# Patient Record
Sex: Female | Born: 1945 | Race: White | Hispanic: No | Marital: Married | Smoking: Never smoker
Health system: Southern US, Community
[De-identification: ages and names within clinical notes are randomized; demographics above are authoritative.]

## PROBLEM LIST (undated history)

## (undated) DIAGNOSIS — Z8719 Personal history of other diseases of the digestive system: Secondary | ICD-10-CM

## (undated) DIAGNOSIS — I499 Cardiac arrhythmia, unspecified: Secondary | ICD-10-CM

## (undated) DIAGNOSIS — E039 Hypothyroidism, unspecified: Secondary | ICD-10-CM

## (undated) DIAGNOSIS — R0602 Shortness of breath: Secondary | ICD-10-CM

## (undated) HISTORY — PX: BUNIONECTOMY: SHX129

---

## 1998-02-25 ENCOUNTER — Other Ambulatory Visit: Admission: RE | Admit: 1998-02-25 | Discharge: 1998-02-25 | Payer: Self-pay | Admitting: Gynecology

## 1999-03-18 ENCOUNTER — Other Ambulatory Visit: Admission: RE | Admit: 1999-03-18 | Discharge: 1999-03-18 | Payer: Self-pay | Admitting: Gynecology

## 2000-03-20 ENCOUNTER — Other Ambulatory Visit: Admission: RE | Admit: 2000-03-20 | Discharge: 2000-03-20 | Payer: Self-pay | Admitting: Gynecology

## 2000-09-26 ENCOUNTER — Ambulatory Visit (HOSPITAL_COMMUNITY): Admission: RE | Admit: 2000-09-26 | Discharge: 2000-09-26 | Payer: Self-pay | Admitting: Gastroenterology

## 2000-09-26 ENCOUNTER — Encounter (INDEPENDENT_AMBULATORY_CARE_PROVIDER_SITE_OTHER): Payer: Self-pay

## 2000-10-31 DIAGNOSIS — D069 Carcinoma in situ of cervix, unspecified: Secondary | ICD-10-CM

## 2000-10-31 HISTORY — DX: Carcinoma in situ of cervix, unspecified: D06.9

## 2001-05-08 ENCOUNTER — Other Ambulatory Visit: Admission: RE | Admit: 2001-05-08 | Discharge: 2001-05-08 | Payer: Self-pay | Admitting: Gynecology

## 2001-05-31 ENCOUNTER — Ambulatory Visit (HOSPITAL_COMMUNITY): Admission: RE | Admit: 2001-05-31 | Discharge: 2001-05-31 | Payer: Self-pay | Admitting: Gynecology

## 2001-05-31 ENCOUNTER — Encounter (INDEPENDENT_AMBULATORY_CARE_PROVIDER_SITE_OTHER): Payer: Self-pay

## 2001-08-10 ENCOUNTER — Encounter (INDEPENDENT_AMBULATORY_CARE_PROVIDER_SITE_OTHER): Payer: Self-pay | Admitting: *Deleted

## 2001-08-10 ENCOUNTER — Ambulatory Visit (HOSPITAL_COMMUNITY): Admission: RE | Admit: 2001-08-10 | Discharge: 2001-08-10 | Payer: Self-pay | Admitting: Gynecology

## 2001-11-08 ENCOUNTER — Other Ambulatory Visit: Admission: RE | Admit: 2001-11-08 | Discharge: 2001-11-08 | Payer: Self-pay | Admitting: Gynecology

## 2002-05-09 ENCOUNTER — Other Ambulatory Visit: Admission: RE | Admit: 2002-05-09 | Discharge: 2002-05-09 | Payer: Self-pay | Admitting: Gynecology

## 2002-11-18 ENCOUNTER — Other Ambulatory Visit: Admission: RE | Admit: 2002-11-18 | Discharge: 2002-11-18 | Payer: Self-pay | Admitting: Gynecology

## 2003-11-20 ENCOUNTER — Other Ambulatory Visit: Admission: RE | Admit: 2003-11-20 | Discharge: 2003-11-20 | Payer: Self-pay | Admitting: Gynecology

## 2004-12-02 ENCOUNTER — Other Ambulatory Visit: Admission: RE | Admit: 2004-12-02 | Discharge: 2004-12-02 | Payer: Self-pay | Admitting: Gynecology

## 2005-12-15 ENCOUNTER — Other Ambulatory Visit: Admission: RE | Admit: 2005-12-15 | Discharge: 2005-12-15 | Payer: Self-pay | Admitting: Gynecology

## 2007-11-01 HISTORY — PX: FOOT SURGERY: SHX648

## 2007-11-01 HISTORY — PX: KNEE ARTHROSCOPY W/ MENISCAL REPAIR: SHX1877

## 2009-01-08 ENCOUNTER — Encounter: Admission: RE | Admit: 2009-01-08 | Discharge: 2009-01-08 | Payer: Self-pay | Admitting: Gastroenterology

## 2011-03-18 NOTE — Op Note (Signed)
[facility]  Patient:    April Padilla, April Padilla Visit Number: [id number] MRN: [id number]          Service Type: DSU Location: [facility] Attending Physician:  Susa Raring Dictated by:   Luvenia Redden, M.D. Proc. Date: 08/10/01 Admit Date:  08/10/2001                             Operative Report  PREOPERATIVE DIAGNOSIS:       Abnormal cervical cytology.  POSTOPERATIVE DIAGNOSIS:      Abnormal cervical cytology.  OPERATION:                    Cold knife conization of the cervix and D&C.  SURGEON:                      Luvenia Redden, M.D.  DESCRIPTION OF PROCEDURE:     Under good sedation, the patient was given a paracervical block by injecting 10 cc of 1% lidocaine at the 4 and 8 oclock positions paracervically.  The uterus was sounded to a depth of 3-1/2 inches. A 0 Vicryl suture was placed submucosally around the cervix and cerclage.  A #11 knife blade was then used to incise a cone-shaped portion of the cervix and the endocervical canal.  Biopsy specimen was tagged at the 12 oclock position with a suture.  The internal os was then dilated slightly.  Curettage was done and this was sent as a separate specimen of endometrial scrapings. The uterine sound was placed in the canal and the internal os, and the suture was then tied down tightly around the uterine sound.  The sound was removed and the area was then observed for excess bleeding for several minutes. Bleeding was mild and the procedure was then terminated.  The patient tolerated the procedure well and she was removed to recovery in good condition. Dictated by:   Luvenia Redden, M.D. Attending Physician:  Susa Raring DD:  08/10/01 TD:  08/10/01 Job: [id number]

## 2011-03-18 NOTE — Op Note (Signed)
[facility]  Patient:    April Padilla, April Padilla                        MRN: [id number] Proc. Date: 05/31/01 Adm. Date:  [id number] Attending:  Susa Raring                           Operative Report  PREOPERATIVE DIAGNOSIS:       Atypical glandular cells on Papanicolaou smear.  POSTOPERATIVE DIAGNOSES:      1. Atypical glandular cells on Papanicolaou                                  smear.                               2. Endometrial polyps.                               3. A small submucous myoma.  OPERATION:  SURGEON:                      Luvenia Redden, M.D.  ANESTHESIA:  ESTIMATED BLOOD LOSS:         10 cc.  DESCRIPTION OF PROCEDURE:     An exam revealed the uterus anterior. Bladder was catheterized. The cervix was grasped with a tenaculum. The cervical endometrial canal was sounded to a depth of 3 inches. The cervix was dilated the hysteroscope was placed in the cervical canal. In the lower segment posteriorly there is a small, looked like 2 cm, submucous myoma that was pale and broad based. Above this, was a large polypoid structure that was moving freely within the endometrial cavity. Both ostia were viewed and they appeared to be normal. The scope was retracted. An ECC was done and this was sent as a separate specimen. The cavity was then explored with polyp forceps and multiple fragments of polypoid tissue was obtained. Next, the endometrial cavity was curetted thoroughly until it was clean. The uterine cavity was wiped with a dry sponge, no debris was obtained. There was no excess bleeding. There was no fluid deficit of Sorbitol. Estimated blood loss was 10 cc, none was replaced. The patient was removed to recovery in good condition. DD:  05/31/01 TD:  06/01/01 Job: [id number]

## 2011-04-07 ENCOUNTER — Other Ambulatory Visit: Payer: Self-pay | Admitting: Gynecology

## 2011-12-13 ENCOUNTER — Encounter (HOSPITAL_COMMUNITY)
Admission: RE | Admit: 2011-12-13 | Discharge: 2011-12-13 | Disposition: A | Discharge status: Home / Self Care | Payer: Medicare Other | Source: Ambulatory Visit | Attending: Orthopedic Surgery | Admitting: Orthopedic Surgery

## 2011-12-13 ENCOUNTER — Encounter (HOSPITAL_COMMUNITY): Payer: Self-pay

## 2011-12-13 ENCOUNTER — Other Ambulatory Visit: Payer: Self-pay | Admitting: Orthopedic Surgery

## 2011-12-13 ENCOUNTER — Other Ambulatory Visit: Payer: Self-pay

## 2011-12-13 ENCOUNTER — Encounter (HOSPITAL_COMMUNITY): Payer: Self-pay | Admitting: Pharmacy Technician

## 2011-12-13 DIAGNOSIS — Z01818 Encounter for other preprocedural examination: Secondary | ICD-10-CM | POA: Insufficient documentation

## 2011-12-13 DIAGNOSIS — M1710 Unilateral primary osteoarthritis, unspecified knee: Secondary | ICD-10-CM | POA: Insufficient documentation

## 2011-12-13 HISTORY — DX: Hypothyroidism, unspecified: E03.9

## 2011-12-13 HISTORY — DX: Cardiac arrhythmia, unspecified: I49.9

## 2011-12-13 HISTORY — DX: Shortness of breath: R06.02

## 2011-12-13 HISTORY — DX: Personal history of other diseases of the digestive system: Z87.19

## 2011-12-13 LAB — TYPE AND SCREEN
ABO/RH(D): A NEG
Antibody Screen: NEGATIVE

## 2011-12-13 LAB — DIFFERENTIAL
Basophils Relative: 1 % (ref 0–1)
Monocytes Absolute: 0.5 10*3/uL (ref 0.1–1.0)
Monocytes Relative: 8 % (ref 3–12)
Neutro Abs: 2.9 10*3/uL (ref 1.7–7.7)

## 2011-12-13 LAB — ABO/RH: ABO/RH(D): A NEG

## 2011-12-13 LAB — COMPREHENSIVE METABOLIC PANEL
AST: 18 U/L (ref 0–37)
Albumin: 3.9 g/dL (ref 3.5–5.2)
Chloride: 103 mEq/L (ref 96–112)
Creatinine, Ser: 0.76 mg/dL (ref 0.50–1.10)
Potassium: 3.8 mEq/L (ref 3.5–5.1)
Total Bilirubin: 0.3 mg/dL (ref 0.3–1.2)

## 2011-12-13 LAB — URINALYSIS, ROUTINE W REFLEX MICROSCOPIC
Glucose, UA: NEGATIVE mg/dL
Nitrite: NEGATIVE
Protein, ur: NEGATIVE mg/dL
Urobilinogen, UA: 0.2 mg/dL (ref 0.0–1.0)

## 2011-12-13 LAB — APTT: aPTT: 29 seconds (ref 24–37)

## 2011-12-13 LAB — CBC
MCV: 92 fL (ref 78.0–100.0)
Platelets: 272 10*3/uL (ref 150–400)
RDW: 12.3 % (ref 11.5–15.5)
WBC: 6.4 10*3/uL (ref 4.0–10.5)

## 2011-12-13 LAB — SURGICAL PCR SCREEN: Staphylococcus aureus: NEGATIVE

## 2011-12-13 LAB — URINE MICROSCOPIC-ADD ON

## 2011-12-13 NOTE — Progress Notes (Signed)
Requested ekg,office notes, any cardiac studies from [facility] in [place] pt. Sees Dr. Rhona Leavens.

## 2011-12-13 NOTE — Progress Notes (Signed)
Requested  Orders from Dr. Tobin Chad office, spoke to [place].

## 2011-12-13 NOTE — Pre-Procedure Instructions (Signed)
20 LADAWN BOULLION  12/13/2011   Your procedure is scheduled on:  Monday  12/26/11 Report to [facility] at  5:30 AM.  Call this number if you have problems the morning of surgery: [phone]   Remember:   Do not eat food:After Midnight.  May have clear liquids: up to 4 Hours before arrival. (1:30 am)  Clear liquids include soda, tea, black coffee, apple or grape juice, broth.  Take these medicines the morning of surgery with A SIP OF WATER: levothyroxine, toprol-xl   Do not wear jewelry, make-up or nail polish.  Do not wear lotions, powders, or perfumes. You may wear deodorant.  Do not shave 48 hours prior to surgery.  Do not bring valuables to the hospital.  Contacts, dentures or bridgework may not be worn into surgery.  Leave suitcase in the car. After surgery it may be brought to your room.  For patients admitted to the hospital, checkout time is 11:00 AM the day of discharge.   Patients discharged the day of surgery will not be allowed to drive home.  Name and phone number of your driver: Talibah Colasurdo [phone]  Special Instructions: CHG Shower Use Special Wash: 1/2 bottle night before surgery and 1/2 bottle morning of surgery.   Please read over the following fact sheets that you were given: Pain Booklet, Coughing and Deep Breathing, Blood Transfusion Information, MRSA Information and Surgical Site Infection Prevention

## 2011-12-14 ENCOUNTER — Encounter (HOSPITAL_COMMUNITY): Payer: Self-pay | Admitting: Vascular Surgery

## 2011-12-14 LAB — URINE CULTURE

## 2011-12-14 NOTE — Consult Note (Addendum)
Anesthesia:  Patient is a 66 year old female scheduled for a left TKA on 12/26/11.  History includes paroxysmal SVT, palpitations, hypothyroidism, SOB, HLD, mild-mod MR 2009 and minimal CAD by CTA 2010 (by Cardiology notes), hiatal hernia, non-smoker.  She is on beta blocker therapy.  EKG shows SR with sinus arrhythmia, cannot rule out anteroseptal infarct (age undetermined).  She has been evaluated by Dr. Rhona Leavens with [facility] in the past, last visit was in November of 2012 and PRN follow-up was recommended unless she developed new or worsening symptoms.  Lead III, aVF r wave are now lower, but otherwise I feel her EKG is stable when compared to 09/01/11.     She had an Exercise Sestamibi on 09/15/08 that showed no ischemia, normal gated imaging, and normal EF.  An echo from 5/509 showed normal LV systolic function, borderline LA enlargement, mild-mod MR, mild TR/PR.  An event recorder in 2008 showed brief runs of SVT.  She apparently has had a cardiac CTA showing minimal disease (records requested).   CXR shows no active disease. Mild degenerative changes mid thoracic spine.   Labs noted.  Anticipate she can proceed if no new CV symptoms.  Addendum: 12/23/11 1120  Over the past week, I've made several attempts to contact Ms. Decoste regarding location of her cardiac CT, but have yet to hear back from her.  I was able to speak with her husband this morning.  He thought the cardiac CT had been done at [facility], but both [facility] and [facility] report that they do not have a record that this test was done there.  While on the phone, I notified her husband of the time change of her surgery.  He says Dr. Tobin Chad office had already contacted them regarding arriving at 0800 on the day of surgery.  Unfortunately, Ms. Burda' mother is doing poorly, so depending of the outcome he says she may have to postpone her surgery all together.  They will keep in communication with  Dr. Sherlean Foot as needed.

## 2011-12-24 ENCOUNTER — Other Ambulatory Visit: Payer: Self-pay | Admitting: Orthopedic Surgery

## 2011-12-24 NOTE — H&P (Signed)
  April Padilla MRN:  [id number] DOB/SEX:  09/23/1946/female  CHIEF COMPLAINT:  Painful left Knee  HISTORY: Patient is a 66 y.o. female presented with a history of pain in the left knee. Onset of symptoms was gradual starting several years ago with gradually worsening course since that time. The patient noted no past surgery on the left knee. Prior procedures on the knee include arthroscopy. Patient has been treated conservatively with over-the-counter NSAIDs and activity modification. Patient currently rates pain in the knee at 8 out of 10 with activity. There is pain at night.  PAST MEDICAL HISTORY: There are no active problems to display for this patient.  Past Medical History  Diagnosis Date  . Dysrhythmia     on toprol for fast heart beat  . Shortness of breath     with exertion  . H/O hiatal hernia   . Hypothyroidism     pt. states she took radioactive pill and has no thyroid   Past Surgical History  Procedure Date  . Knee arthroscopy w/ meniscal repair 2009    left  . Foot surgery 2009    took knot out  and put in screws  . Bunionectomy yrs. ago    left foot     MEDICATIONS:   (Not in a hospital admission)  ALLERGIES:   Allergies  Allergen Reactions  . Clindamycin/Lincomycin Rash  . Macrobid Rash  . Silver Sulfadiazine Rash    REVIEW OF SYSTEMS:  Pertinent items are noted in HPI.   FAMILY HISTORY:  No family history on file.  SOCIAL HISTORY:   History  Substance Use Topics  . Smoking status: Never Smoker   . Smokeless tobacco: Not on file  . Alcohol Use: Yes     occasional     EXAMINATION:  Vital signs in last 24 hours: @VSRANGES @  General appearance: alert, cooperative and no distress Lungs: clear to auscultation bilaterally Heart: regular rate and rhythm, S1, S2 normal, no murmur, click, rub or gallop Abdomen: soft, non-tender; bowel sounds normal; no masses,  no organomegaly Extremities: extremities normal, atraumatic, no cyanosis or edema  and Homans sign is negative, no sign of DVT Pulses: 2+ and symmetric Skin: Skin color, texture, turgor normal. No rashes or lesions Neurologic: Alert and oriented X 3, normal strength and tone. Normal symmetric reflexes. Normal coordination and gait  Musculoskeletal:  ROM 0-110, Ligaments intact,  Imaging Review Plain radiographs demonstrate severe degenerative joint disease of the left knee. The overall alignment is mild varus. The bone quality appears to be fair for age and reported activity level.  Assessment/Plan: End stage arthritis, left knee   The patient history, physical examination and imaging studies are consistent with advanced degenerative joint disease of the left knee. The patient has failed conservative treatment.  The clearance notes were reviewed.  After discussion with the patient it was felt that Total Knee Replacement was indicated. The procedure,  risks, and benefits of total knee arthroplasty were presented and reviewed. The risks including but not limited to aseptic loosening, infection, blood clots, vascular injury, stiffness, patella tracking problems complications among others were discussed. The patient acknowledged the explanation, agreed to proceed with the plan.  Kiyoko Mcguirt 12/24/2011, 3:24 PM

## 2011-12-25 MED ORDER — CEFAZOLIN SODIUM-DEXTROSE 2-3 GM-% IV SOLR: 2.0000 g | INTRAVENOUS | Status: DC

## 2011-12-26 ENCOUNTER — Encounter (HOSPITAL_COMMUNITY): Admission: RE | Payer: Self-pay | Source: Ambulatory Visit

## 2011-12-26 ENCOUNTER — Ambulatory Visit (HOSPITAL_COMMUNITY): Admission: RE | Admit: 2011-12-26 | Payer: Medicare Other | Source: Ambulatory Visit | Admitting: Orthopedic Surgery

## 2011-12-26 SURGERY — ARTHROPLASTY, KNEE, TOTAL
Anesthesia: Regional | Laterality: Left

## 2012-01-05 ENCOUNTER — Other Ambulatory Visit: Payer: Self-pay | Admitting: Orthopedic Surgery

## 2012-01-06 ENCOUNTER — Encounter (HOSPITAL_COMMUNITY)
Admission: RE | Admit: 2012-01-06 | Discharge: 2012-01-06 | Disposition: A | Discharge status: Home / Self Care | Payer: Medicare Other | Source: Ambulatory Visit | Attending: Orthopedic Surgery | Admitting: Orthopedic Surgery

## 2012-01-06 ENCOUNTER — Encounter (HOSPITAL_COMMUNITY): Payer: Self-pay

## 2012-01-06 LAB — URINALYSIS, ROUTINE W REFLEX MICROSCOPIC
Leukocytes, UA: NEGATIVE
Protein, ur: NEGATIVE mg/dL
Urobilinogen, UA: 0.2 mg/dL (ref 0.0–1.0)

## 2012-01-06 LAB — TYPE AND SCREEN
ABO/RH(D): A NEG
Antibody Screen: NEGATIVE

## 2012-01-06 LAB — DIFFERENTIAL
Basophils Absolute: 0 10*3/uL (ref 0.0–0.1)
Lymphocytes Relative: 35 % (ref 12–46)
Neutro Abs: 3.3 10*3/uL (ref 1.7–7.7)
Neutrophils Relative %: 51 % (ref 43–77)

## 2012-01-06 LAB — COMPREHENSIVE METABOLIC PANEL
AST: 16 U/L (ref 0–37)
BUN: 11 mg/dL (ref 6–23)
CO2: 27 mEq/L (ref 19–32)
Calcium: 9.9 mg/dL (ref 8.4–10.5)
Creatinine, Ser: 0.84 mg/dL (ref 0.50–1.10)
GFR calc Af Amer: 82 mL/min — ABNORMAL LOW (ref >90)
GFR calc non Af Amer: 70 mL/min — ABNORMAL LOW (ref >90)

## 2012-01-06 LAB — PROTIME-INR
INR: 0.92 (ref 0.00–1.49)
Prothrombin Time: 12.6 seconds (ref 11.6–15.2)

## 2012-01-06 LAB — CBC
MCH: 30.8 pg (ref 26.0–34.0)
MCV: 90.1 fL (ref 78.0–100.0)
Platelets: 249 10*3/uL (ref 150–400)
RBC: 4.26 MIL/uL (ref 3.87–5.11)

## 2012-01-06 MED ORDER — CHLORHEXIDINE GLUCONATE 4 % EX LIQD: 60.0000 mL | Freq: Once | CUTANEOUS | Status: DC

## 2012-01-06 NOTE — Pre-Procedure Instructions (Signed)
[name]  01/06/2012   Your procedure is scheduled on:  Mon, Mar 18 @ 1115 am  Report to [facility] at 0915 AM.  Call this number if you have problems the morning of surgery: [phone]   Remember:   Do not eat food:After Midnight.  May have clear liquids: up to 4 Hours before arrival.(until 5:15 am)  Clear liquids include soda, tea, black coffee, apple or grape juice, broth.  Take these medicines the morning of surgery with A SIP OF WATER: Metoprolol and Synthroid   Do not wear jewelry, make-up or nail polish.  Do not wear lotions, powders, or perfumes. You may wear deodorant.  Do not shave 48 hours prior to surgery.  Do not bring valuables to the hospital.  Contacts, dentures or bridgework may not be worn into surgery.  Leave suitcase in the car. After surgery it may be brought to your room.  For patients admitted to the hospital, checkout time is 11:00 AM the day of discharge.   Patients discharged the day of surgery will not be allowed to drive home.    Special Instructions: Incentive Spirometry - Practice and bring it with you on the day of surgery. and CHG Shower Use Special Wash: 1/2 bottle night before surgery and 1/2 bottle morning of surgery.   Please read over the following fact sheets that you were given: Pain Booklet, Coughing and Deep Breathing, Blood Transfusion Information, Total Joint Packet, MRSA Information and Surgical Site Infection Prevention

## 2012-01-07 LAB — URINE CULTURE

## 2012-01-15 MED ORDER — CEFAZOLIN SODIUM-DEXTROSE 2-3 GM-% IV SOLR
2.0000 g | INTRAVENOUS | Status: AC
  Administered 2012-01-16: 2 g via INTRAVENOUS
  Filled 2012-01-15 (×4): qty 50

## 2012-01-16 ENCOUNTER — Inpatient Hospital Stay (HOSPITAL_COMMUNITY)
Admission: RE | Admit: 2012-01-16 | Discharge: 2012-01-18 | DRG: 470 | Disposition: A | Discharge status: Home Health | Payer: Medicare Other | Source: Ambulatory Visit | Attending: Orthopedic Surgery | Admitting: Orthopedic Surgery

## 2012-01-16 ENCOUNTER — Ambulatory Visit (HOSPITAL_COMMUNITY): Payer: Medicare Other | Admitting: Vascular Surgery

## 2012-01-16 ENCOUNTER — Encounter (HOSPITAL_COMMUNITY): Payer: Self-pay | Admitting: Anesthesiology

## 2012-01-16 ENCOUNTER — Encounter (HOSPITAL_COMMUNITY): Payer: Self-pay | Admitting: Vascular Surgery

## 2012-01-16 ENCOUNTER — Encounter (HOSPITAL_COMMUNITY)
Admission: RE | Disposition: A | Discharge status: Home Health | Payer: Self-pay | Source: Ambulatory Visit | Attending: Orthopedic Surgery

## 2012-01-16 ENCOUNTER — Encounter (HOSPITAL_COMMUNITY): Payer: Self-pay | Admitting: *Deleted

## 2012-01-16 DIAGNOSIS — D62 Acute posthemorrhagic anemia: Secondary | ICD-10-CM | POA: Diagnosis not present

## 2012-01-16 DIAGNOSIS — E039 Hypothyroidism, unspecified: Secondary | ICD-10-CM | POA: Diagnosis present

## 2012-01-16 DIAGNOSIS — M1710 Unilateral primary osteoarthritis, unspecified knee: Principal | ICD-10-CM | POA: Diagnosis present

## 2012-01-16 DIAGNOSIS — M1712 Unilateral primary osteoarthritis, left knee: Secondary | ICD-10-CM

## 2012-01-16 DIAGNOSIS — Z79899 Other long term (current) drug therapy: Secondary | ICD-10-CM

## 2012-01-16 HISTORY — PX: TOTAL KNEE ARTHROPLASTY: SHX125

## 2012-01-16 SURGERY — ARTHROPLASTY, KNEE, TOTAL
Anesthesia: General | Site: Knee | Laterality: Left | Wound class: Clean

## 2012-01-16 MED ORDER — FENTANYL CITRATE 0.05 MG/ML IJ SOLN
INTRAMUSCULAR | Status: DC | PRN
  Administered 2012-01-16: 100 ug via INTRAVENOUS
  Administered 2012-01-16 (×2): 25 ug via INTRAVENOUS
  Administered 2012-01-16: 50 ug via INTRAVENOUS
  Administered 2012-01-16 (×2): 25 ug via INTRAVENOUS

## 2012-01-16 MED ORDER — FENTANYL CITRATE 0.05 MG/ML IJ SOLN
INTRAMUSCULAR | Status: AC
  Filled 2012-01-16: qty 2

## 2012-01-16 MED ORDER — ACETAMINOPHEN 10 MG/ML IV SOLN
INTRAVENOUS | Status: AC
  Filled 2012-01-16: qty 100

## 2012-01-16 MED ORDER — MENTHOL 3 MG MT LOZG: 1.0000 | LOZENGE | OROMUCOSAL | Status: DC | PRN

## 2012-01-16 MED ORDER — ALUM & MAG HYDROXIDE-SIMETH 200-200-20 MG/5ML PO SUSP
30.0000 mL | ORAL | Status: DC | PRN
  Administered 2012-01-17: 30 mL via ORAL
  Filled 2012-01-16: qty 30

## 2012-01-16 MED ORDER — BUPIVACAINE 0.25 % ON-Q PUMP SINGLE CATH 300ML
INJECTION | Status: DC | PRN
  Administered 2012-01-16: 300 mL

## 2012-01-16 MED ORDER — SODIUM CHLORIDE 0.9 % IV SOLN: INTRAVENOUS | Status: DC

## 2012-01-16 MED ORDER — OXYCODONE HCL 10 MG PO TB12
10.0000 mg | ORAL_TABLET | Freq: Two times a day (BID) | ORAL | Status: DC
  Administered 2012-01-16 – 2012-01-18 (×5): 10 mg via ORAL
  Filled 2012-01-16 (×5): qty 1

## 2012-01-16 MED ORDER — ONDANSETRON HCL 4 MG/2ML IJ SOLN
INTRAMUSCULAR | Status: DC | PRN
  Administered 2012-01-16: 4 mg via INTRAVENOUS

## 2012-01-16 MED ORDER — ONDANSETRON HCL 4 MG PO TABS: 4.0000 mg | ORAL_TABLET | Freq: Four times a day (QID) | ORAL | Status: DC | PRN

## 2012-01-16 MED ORDER — OXYCODONE HCL 5 MG PO TABS
5.0000 mg | ORAL_TABLET | ORAL | Status: DC | PRN
Start: 2012-01-16 — End: 2012-01-18
  Administered 2012-01-17 – 2012-01-18 (×2): 10 mg via ORAL
  Filled 2012-01-16 (×2): qty 2

## 2012-01-16 MED ORDER — CEFAZOLIN SODIUM-DEXTROSE 2-3 GM-% IV SOLR
2.0000 g | Freq: Four times a day (QID) | INTRAVENOUS | Status: AC
  Administered 2012-01-16 – 2012-01-17 (×3): 2 g via INTRAVENOUS
  Filled 2012-01-16 (×3): qty 50

## 2012-01-16 MED ORDER — ACETAMINOPHEN 650 MG RE SUPP: 650.0000 mg | Freq: Four times a day (QID) | RECTAL | Status: DC | PRN

## 2012-01-16 MED ORDER — METOCLOPRAMIDE HCL 5 MG/ML IJ SOLN: 5.0000 mg | Freq: Three times a day (TID) | INTRAMUSCULAR | Status: DC | PRN

## 2012-01-16 MED ORDER — LACTATED RINGERS IV SOLN
INTRAVENOUS | Status: DC
  Administered 2012-01-16: 11:00:00 via INTRAVENOUS

## 2012-01-16 MED ORDER — ENOXAPARIN SODIUM 40 MG/0.4ML ~~LOC~~ SOLN
40.0000 mg | Freq: Two times a day (BID) | SUBCUTANEOUS | Status: DC
  Administered 2012-01-17 – 2012-01-18 (×3): 40 mg via SUBCUTANEOUS
  Filled 2012-01-16 (×6): qty 0.4

## 2012-01-16 MED ORDER — IBUPROFEN 600 MG PO TABS
600.0000 mg | ORAL_TABLET | Freq: Three times a day (TID) | ORAL | Status: DC
  Administered 2012-01-16 – 2012-01-18 (×5): 600 mg via ORAL
  Filled 2012-01-16 (×9): qty 1

## 2012-01-16 MED ORDER — DIPHENHYDRAMINE HCL 12.5 MG/5ML PO ELIX: 12.5000 mg | ORAL_SOLUTION | ORAL | Status: DC | PRN

## 2012-01-16 MED ORDER — BUPIVACAINE-EPINEPHRINE PF 0.5-1:200000 % IJ SOLN
INTRAMUSCULAR | Status: DC | PRN
  Administered 2012-01-16: 30 mL

## 2012-01-16 MED ORDER — BUPIVACAINE 0.25 % ON-Q PUMP SINGLE CATH 300ML
300.0000 mL | INJECTION | Status: DC
  Filled 2012-01-16: qty 300

## 2012-01-16 MED ORDER — BUPIVACAINE ON-Q PAIN PUMP (FOR ORDER SET NO CHG)
INJECTION | Status: DC
  Filled 2012-01-16: qty 1

## 2012-01-16 MED ORDER — BISACODYL 5 MG PO TBEC: 5.0000 mg | DELAYED_RELEASE_TABLET | Freq: Every day | ORAL | Status: DC | PRN

## 2012-01-16 MED ORDER — MORPHINE SULFATE 2 MG/ML IJ SOLN: 0.0500 mg/kg | INTRAMUSCULAR | Status: DC | PRN

## 2012-01-16 MED ORDER — PHENOL 1.4 % MT LIQD: 1.0000 | OROMUCOSAL | Status: DC | PRN

## 2012-01-16 MED ORDER — DEXAMETHASONE SODIUM PHOSPHATE 4 MG/ML IJ SOLN
INTRAMUSCULAR | Status: DC | PRN
  Administered 2012-01-16: 4 mg via INTRAVENOUS

## 2012-01-16 MED ORDER — LACTATED RINGERS IV SOLN
INTRAVENOUS | Status: DC | PRN
  Administered 2012-01-16 (×2): via INTRAVENOUS

## 2012-01-16 MED ORDER — SENNOSIDES-DOCUSATE SODIUM 8.6-50 MG PO TABS: 1.0000 | ORAL_TABLET | Freq: Every evening | ORAL | Status: DC | PRN

## 2012-01-16 MED ORDER — HYDROMORPHONE HCL PF 1 MG/ML IJ SOLN: 0.5000 mg | INTRAMUSCULAR | Status: DC | PRN

## 2012-01-16 MED ORDER — SODIUM CHLORIDE 0.9 % IV SOLN
INTRAVENOUS | Status: DC
  Administered 2012-01-16 – 2012-01-17 (×2): via INTRAVENOUS

## 2012-01-16 MED ORDER — LEVOTHYROXINE SODIUM 112 MCG PO TABS
112.0000 ug | ORAL_TABLET | Freq: Every day | ORAL | Status: DC
  Administered 2012-01-17 – 2012-01-18 (×2): 112 ug via ORAL
  Filled 2012-01-16 (×4): qty 1

## 2012-01-16 MED ORDER — FENTANYL CITRATE 0.05 MG/ML IJ SOLN
50.0000 ug | INTRAMUSCULAR | Status: DC | PRN
  Administered 2012-01-16: 100 ug via INTRAVENOUS

## 2012-01-16 MED ORDER — SODIUM CHLORIDE 0.9 % IR SOLN
Status: DC | PRN
  Administered 2012-01-16: 3000 mL

## 2012-01-16 MED ORDER — METHOCARBAMOL 500 MG PO TABS
500.0000 mg | ORAL_TABLET | Freq: Four times a day (QID) | ORAL | Status: DC | PRN
  Administered 2012-01-17: 500 mg via ORAL
  Filled 2012-01-16: qty 1

## 2012-01-16 MED ORDER — PANTOPRAZOLE SODIUM 40 MG PO TBEC
40.0000 mg | DELAYED_RELEASE_TABLET | Freq: Every day | ORAL | Status: DC
  Administered 2012-01-16: 40 mg via ORAL
  Filled 2012-01-16 (×4): qty 1

## 2012-01-16 MED ORDER — MIDAZOLAM HCL 2 MG/2ML IJ SOLN
INTRAMUSCULAR | Status: AC
  Filled 2012-01-16: qty 2

## 2012-01-16 MED ORDER — PROPOFOL 10 MG/ML IV EMUL
INTRAVENOUS | Status: DC | PRN
  Administered 2012-01-16: 150 mg via INTRAVENOUS

## 2012-01-16 MED ORDER — MORPHINE SULFATE 10 MG/ML IJ SOLN
INTRAMUSCULAR | Status: DC | PRN
  Administered 2012-01-16 (×2): 5 mg via INTRAVENOUS

## 2012-01-16 MED ORDER — FAMOTIDINE 20 MG PO TABS
20.0000 mg | ORAL_TABLET | Freq: Two times a day (BID) | ORAL | Status: DC
  Administered 2012-01-16 – 2012-01-17 (×2): 20 mg via ORAL
  Filled 2012-01-16 (×6): qty 1

## 2012-01-16 MED ORDER — DOCUSATE SODIUM 100 MG PO CAPS
100.0000 mg | ORAL_CAPSULE | Freq: Two times a day (BID) | ORAL | Status: DC
  Administered 2012-01-16 – 2012-01-18 (×5): 100 mg via ORAL
  Filled 2012-01-16 (×7): qty 1

## 2012-01-16 MED ORDER — ATORVASTATIN CALCIUM 10 MG PO TABS
5.0000 mg | ORAL_TABLET | Freq: Every evening | ORAL | Status: DC
  Administered 2012-01-16 – 2012-01-17 (×2): 5 mg via ORAL
  Filled 2012-01-16 (×3): qty 0.5

## 2012-01-16 MED ORDER — MIDAZOLAM HCL 2 MG/2ML IJ SOLN
1.0000 mg | INTRAMUSCULAR | Status: DC | PRN
  Administered 2012-01-16: 2 mg via INTRAVENOUS

## 2012-01-16 MED ORDER — ARTIFICIAL TEARS OP OINT
TOPICAL_OINTMENT | OPHTHALMIC | Status: DC | PRN
  Administered 2012-01-16: 1 via OPHTHALMIC

## 2012-01-16 MED ORDER — METOPROLOL SUCCINATE ER 25 MG PO TB24
25.0000 mg | ORAL_TABLET | Freq: Every day | ORAL | Status: DC
  Administered 2012-01-17 – 2012-01-18 (×2): 25 mg via ORAL
  Filled 2012-01-16 (×3): qty 1

## 2012-01-16 MED ORDER — METHOCARBAMOL 100 MG/ML IJ SOLN
500.0000 mg | INTRAVENOUS | Status: AC
  Administered 2012-01-16: 500 mg via INTRAVENOUS
  Filled 2012-01-16: qty 5

## 2012-01-16 MED ORDER — BUPIVACAINE-EPINEPHRINE 0.25% -1:200000 IJ SOLN
INTRAMUSCULAR | Status: DC | PRN
  Administered 2012-01-16: 20 mL

## 2012-01-16 MED ORDER — FLEET ENEMA 7-19 GM/118ML RE ENEM: 1.0000 | ENEMA | Freq: Once | RECTAL | Status: AC | PRN

## 2012-01-16 MED ORDER — DEXTROSE 5 % IV SOLN
500.0000 mg | Freq: Four times a day (QID) | INTRAVENOUS | Status: DC | PRN
  Filled 2012-01-16: qty 5

## 2012-01-16 MED ORDER — ACETAMINOPHEN 325 MG PO TABS: 650.0000 mg | ORAL_TABLET | Freq: Four times a day (QID) | ORAL | Status: DC | PRN

## 2012-01-16 MED ORDER — ONDANSETRON HCL 4 MG/2ML IJ SOLN: 4.0000 mg | Freq: Four times a day (QID) | INTRAMUSCULAR | Status: DC | PRN

## 2012-01-16 MED ORDER — ZOLPIDEM TARTRATE 5 MG PO TABS: 5.0000 mg | ORAL_TABLET | Freq: Every evening | ORAL | Status: DC | PRN

## 2012-01-16 MED ORDER — ONDANSETRON HCL 4 MG/2ML IJ SOLN: 4.0000 mg | Freq: Once | INTRAMUSCULAR | Status: DC | PRN

## 2012-01-16 MED ORDER — ACETAMINOPHEN 10 MG/ML IV SOLN
1000.0000 mg | Freq: Four times a day (QID) | INTRAVENOUS | Status: DC
  Administered 2012-01-16: 1000 mg via INTRAVENOUS

## 2012-01-16 MED ORDER — HYDROMORPHONE HCL PF 1 MG/ML IJ SOLN
0.2500 mg | INTRAMUSCULAR | Status: DC | PRN
  Administered 2012-01-16 (×2): 0.5 mg via INTRAVENOUS

## 2012-01-16 MED ORDER — ACETAMINOPHEN 10 MG/ML IV SOLN: 1000.0000 mg | Freq: Four times a day (QID) | INTRAVENOUS | Status: DC

## 2012-01-16 MED ORDER — METOCLOPRAMIDE HCL 10 MG PO TABS: 5.0000 mg | ORAL_TABLET | Freq: Three times a day (TID) | ORAL | Status: DC | PRN

## 2012-01-16 SURGICAL SUPPLY — 56 items
BANDAGE ESMARK 6X9 LF (GAUZE/BANDAGES/DRESSINGS) ×1 IMPLANT
BLADE SAGITTAL 13X1.27X60 (BLADE) ×2 IMPLANT
BLADE SAW SGTL 83.5X18.5 (BLADE) ×2 IMPLANT
BNDG CMPR 9X6 STRL LF SNTH (GAUZE/BANDAGES/DRESSINGS) ×1
BNDG ESMARK 6X9 LF (GAUZE/BANDAGES/DRESSINGS) ×2
BOWL SMART MIX CTS (DISPOSABLE) ×2 IMPLANT
CATH KIT ON Q 5IN SLV (PAIN MANAGEMENT) ×2 IMPLANT
CEMENT BONE SIMPLEX SPEEDSET (Cement) ×4 IMPLANT
CLOTH BEACON ORANGE TIMEOUT ST (SAFETY) ×2 IMPLANT
COVER BACK TABLE 24X17X13 BIG (DRAPES) ×1 IMPLANT
COVER SURGICAL LIGHT HANDLE (MISCELLANEOUS) ×2 IMPLANT
CUFF TOURNIQUET SINGLE 34IN LL (TOURNIQUET CUFF) ×2 IMPLANT
DRAPE EXTREMITY T 121X128X90 (DRAPE) ×2 IMPLANT
DRAPE INCISE IOBAN 66X45 STRL (DRAPES) ×4 IMPLANT
DRAPE PROXIMA HALF (DRAPES) ×2 IMPLANT
DRAPE U-SHAPE 47X51 STRL (DRAPES) ×2 IMPLANT
DRSG ADAPTIC 3X8 NADH LF (GAUZE/BANDAGES/DRESSINGS) ×2 IMPLANT
DRSG PAD ABDOMINAL 8X10 ST (GAUZE/BANDAGES/DRESSINGS) ×2 IMPLANT
DURAPREP 26ML APPLICATOR (WOUND CARE) ×3 IMPLANT
ELECT REM PT RETURN 9FT ADLT (ELECTROSURGICAL) ×2
ELECTRODE REM PT RTRN 9FT ADLT (ELECTROSURGICAL) ×1 IMPLANT
EVACUATOR 1/8 PVC DRAIN (DRAIN) ×2 IMPLANT
GLOVE BIOGEL M 7.0 STRL (GLOVE) ×1 IMPLANT
GLOVE BIOGEL PI IND STRL 7.5 (GLOVE) IMPLANT
GLOVE BIOGEL PI IND STRL 8.5 (GLOVE) ×2 IMPLANT
GLOVE BIOGEL PI INDICATOR 7.5 (GLOVE) ×1
GLOVE BIOGEL PI INDICATOR 8.5 (GLOVE) ×2
GLOVE SURG ORTHO 8.0 STRL STRW (GLOVE) ×4 IMPLANT
GOWN PREVENTION PLUS XLARGE (GOWN DISPOSABLE) ×4 IMPLANT
GOWN STRL NON-REIN LRG LVL3 (GOWN DISPOSABLE) ×3 IMPLANT
HANDPIECE INTERPULSE COAX TIP (DISPOSABLE) ×2
HOOD PEEL AWAY FACE SHEILD DIS (HOOD) ×7 IMPLANT
KIT BASIN OR (CUSTOM PROCEDURE TRAY) ×2 IMPLANT
KIT ROOM TURNOVER OR (KITS) ×2 IMPLANT
MANIFOLD NEPTUNE II (INSTRUMENTS) ×2 IMPLANT
NEEDLE 22X1 1/2 (OR ONLY) (NEEDLE) IMPLANT
NS IRRIG 1000ML POUR BTL (IV SOLUTION) ×2 IMPLANT
PACK TOTAL JOINT (CUSTOM PROCEDURE TRAY) ×2 IMPLANT
PAD ARMBOARD 7.5X6 YLW CONV (MISCELLANEOUS) ×3 IMPLANT
PADDING CAST COTTON 6X4 STRL (CAST SUPPLIES) ×2 IMPLANT
POSITIONER HEAD PRONE TRACH (MISCELLANEOUS) ×2 IMPLANT
SET HNDPC FAN SPRY TIP SCT (DISPOSABLE) ×1 IMPLANT
SPONGE GAUZE 4X4 12PLY (GAUZE/BANDAGES/DRESSINGS) ×2 IMPLANT
STAPLER VISISTAT 35W (STAPLE) ×2 IMPLANT
SUCTION FRAZIER TIP 10 FR DISP (SUCTIONS) ×2 IMPLANT
SUT BONE WAX W31G (SUTURE) ×2 IMPLANT
SUT VIC AB 0 CTB1 27 (SUTURE) ×4 IMPLANT
SUT VIC AB 1 CT1 27 (SUTURE) ×6
SUT VIC AB 1 CT1 27XBRD ANBCTR (SUTURE) ×4 IMPLANT
SUT VIC AB 2-0 CT1 27 (SUTURE) ×4
SUT VIC AB 2-0 CT1 TAPERPNT 27 (SUTURE) ×2 IMPLANT
SYR CONTROL 10ML LL (SYRINGE) IMPLANT
TOWEL OR 17X24 6PK STRL BLUE (TOWEL DISPOSABLE) ×2 IMPLANT
TOWEL OR 17X26 10 PK STRL BLUE (TOWEL DISPOSABLE) ×2 IMPLANT
TRAY FOLEY CATH 14FR (SET/KITS/TRAYS/PACK) ×2 IMPLANT
WATER STERILE IRR 1000ML POUR (IV SOLUTION) ×4 IMPLANT

## 2012-01-16 NOTE — Op Note (Signed)
TOTAL KNEE REPLACEMENT OPERATIVE NOTE:  01/16/2012  1:45 PM  PATIENT:  April Padilla  66 y.o. female  PRE-OPERATIVE DIAGNOSIS:  osteoarthritis left knee  POST-OPERATIVE DIAGNOSIS:  osteoarthritis left knee  PROCEDURE:  Procedure(s): TOTAL KNEE ARTHROPLASTY  SURGEON:  Surgeon(s): Raymon Mutton, MD  PHYSICIAN ASSISTANT: Altamese Cabal, [facility]  ANESTHESIA:   general  DRAINS: Hemovac and On-Q Marcaine Pain Pump  SPECIMEN: None  COUNTS:  Correct  TOURNIQUET:   Total Tourniquet Time Documented: Thigh (Left) - 41 minutes  DICTATION:  Indication for procedure:    The patient is a 66 y.o. female who has failed conservative treatment for osteoarthritis left knee.  Informed consent was obtained prior to anesthesia. The risks versus benefits of the operation were explain and in a way the patient can, and did, understand.   Description of procedure:     The patient was taken to the operating room and placed under anesthesia.  The patient was positioned in the usual fashion taking care that all body parts were adequately padded and/or protected.  I foley catheter was placed.  A tourniquet was applied and the leg prepped and draped in the usual sterile fashion.  The extremity was exsanguinated with the esmarch and tourniquet inflated to 350 mmHg.  Pre-operative range of motion was normal.  The knee was in 6 degree of mild varus.  A midline incision approximately 6-7 inches long was made with a #10 blade.  A new blade was used to make a parapatellar arthrotomy going 2-3 cm into the quadriceps tendon, over the patella, and alongside the medial aspect of the patellar tendon.  A synovectomy was then performed with the #10 blade and forceps. I then elevated the deep MCL off the medial tibial metaphysis subperiosteally around to the semimembranosus attachment.    I everted the patella and used calipers to measure patellar thickness.  I used the reamer to ream down to appropriate thickness to  recreate the native thickness.  I then removed excess bone with the rongeur and sagittal saw.  I used the appropriately sized template and drilled the three lug holes.  I then put the trial in place and measured the thickness with the calipers to ensure recreation of the native thickness.  The trial was then removed and the patella subluxed and the knee brought into flexion.  A homan retractor was place to retract and protect the patella and lateral structures.  A Z-retractor was place medially to protect the medial structures.  The extra-medullary alignment system was used to make cut the tibial articular surface perpendicular to the anamotic axis of the tibia and in 3 degrees of posterior slope.  The cut surface and alignment jig was removed.  I then used the intramedullary alignment guide to make a 6 valgus cut on the distal femur.  I then marked out the epicondylar axis on the distal femur.  The posterior condylar axis measured 3 degrees.  I then used the anterior referencing sizer and measured the femur to be a size D.  The 4-In-1 cutting block was screwed into place in external rotation matching the posterior condylar angle, making our cuts perpendicular to the epicondylar axis.  Anterior, posterior and chamfer cuts were made with the sagittal saw.  The cutting block and cut pieces were removed.  A lamina spreader was placed in 90 degrees of flexion.  The ACL, PCL, menisci, and posterior condylar osteophytes were removed.  A 12 mm spacer blocked was found to offer good flexion  and extension gap balance after minimal in degree releasing.   The scoop retractor was then placed and the femoral finishing block was pinned in place.  The small sagittal saw was used as well as the lug drill to finish the femur.  The block and cut surfaces were removed and the medullary canal hole filled with autograft bone from the cut pieces.  The tibia was delivered forward in deep flexion and external rotation.  A size 3  tray was selected and pinned into place centered on the medial 1/3 of the tibial tubercle.  The reamer and keel was used to prepare the tibia through the tray.    I then trialed with the size D femur, size 3 tibia, a 12 mm insert and the 32 patella.  I had excellent flexion/extension gap balance, excellent patella tracking.  Flexion was full and beyond 120 degrees; extension was zero.  These components were chosen and the staff opened them to me on the back table while the knee was lavaged copiously and the cement mixed.  I cemented in the components and removed all excess cement.  The polyethylene tibial component was snapped into place and the knee placed in extension while cement was hardening.  The capsule was infilltrated with 20cc of .25% Marcaine with epinephrine.  A hemovac was place in the joint exiting superolaterally.  A pain pump was place superomedially superficial to the arthrotomy.  Once the cement was hard, the tourniquet was let down.  Hemostasis was obtained.  The arthrotomy was closed with figure-8 #1 vicryl sutures.  The deep soft tissues were closed with #0 vicryls and the subcuticular layer closed with a running #2-0 vicryl.  The skin was reapproximated and closed with skin staples.  The wound was dressed with xeroform, 4 x4's, 2 ABD sponges, a single layer of webril and a TED stocking.   The patient was then awakened, extubated, and taken to the recovery room in stable condition.  BLOOD LOSS:  300cc DRAINS: 1 hemovac, 1 pain catheter COMPLICATIONS:  None.  PLAN OF CARE: Admit to inpatient   PATIENT DISPOSITION:  PACU - hemodynamically stable.   Delay start of Pharmacological VTE agent (>24hrs) due to surgical blood loss or risk of bleeding:  not applicable  Please fax a copy of this op note to my office at [phone] (please only include page 1 and 2 of the Case Information op note)

## 2012-01-16 NOTE — Anesthesia Procedure Notes (Addendum)
Procedure Name: LMA Insertion Date/Time: 01/16/2012 11:54 AM Performed by: Leona Singleton A Pre-anesthesia Checklist: Patient identified Patient Re-evaluated:Patient Re-evaluated prior to inductionOxygen Delivery Method: Circle system utilized Preoxygenation: Pre-oxygenation with 100% oxygen Intubation Type: IV induction LMA: LMA inserted LMA Size: 4.0 Tube type: Oral Number of attempts: 1 Placement Confirmation: positive ETCO2 and breath sounds checked- equal and bilateral Tube secured with: Tape Dental Injury: Teeth and Oropharynx as per pre-operative assessment    Anesthesia Regional Block:  Femoral nerve block  Pre-Anesthetic Checklist: ,, timeout performed, Correct Patient, Correct Site, Correct Laterality, Correct Procedure,, site marked, risks and benefits discussed, Surgical consent, Pre-op evaluation,  At surgeon's request  Laterality: Left  Prep: chloraprep       Needles:  Injection technique: Single-shot      Needle Gauge: 22 and 22 G    Additional Needles:  Procedures: nerve stimulator Femoral nerve block Narrative:  Start time: 01/16/2012 10:45 AM End time: 01/16/2012 11:00 AM Injection made incrementally with aspirations every 5 mL.  Performed by: Personally  Anesthesiologist: Arta Bruce MD  Additional Notes: A functioning IV was confirmed and monitors were applied.  Sterile prep and drape, hand hygiene and sterile gloves were used.  Negative aspiration prior to incremental administration of local anesthetic using the 22 ga needle. The patient tolerated the procedure well.   Femoral nerve block

## 2012-01-16 NOTE — Anesthesia Postprocedure Evaluation (Signed)
Anesthesia Post Note  Patient: April Padilla  Procedure(s) Performed: Procedure(s) (LRB): TOTAL KNEE ARTHROPLASTY (Left)  Anesthesia type: general  Patient location: PACU  Post pain: Pain level controlled  Post assessment: Patient's Cardiovascular Status Stable  Last Vitals:  Filed Vitals:   01/16/12 1500  BP: 122/63  Pulse: 82  Temp: 36.7 C  Resp: 12    Post vital signs: Reviewed and stable  Level of consciousness: sedated  Complications: No apparent anesthesia complications

## 2012-01-16 NOTE — Progress Notes (Signed)
Orthopedic Tech Progress Note Patient Details:  April Padilla 01-05-1946 [id number]  CPM Left Knee CPM Left Knee: On Left Knee Flexion (Degrees): 90  Left Knee Extension (Degrees): 0    Bettendorf Antolin T 01/16/2012, 3:31 PM

## 2012-01-16 NOTE — Plan of Care (Signed)
Problem: Consults Goal: Diagnosis- Total Joint Replacement Primary Total Knee Left     

## 2012-01-16 NOTE — Anesthesia Preprocedure Evaluation (Addendum)
Anesthesia Evaluation  Patient identified by MRN, date of birth, ID band Patient awake    Reviewed: Allergy & Precautions, H&P , NPO status , Patient's Chart, lab work & pertinent test results, reviewed documented beta blocker date and time   History of Anesthesia Complications Negative for: history of anesthetic complications  Airway Mallampati: II TM Distance: >3 FB Neck ROM: Full    Dental  (+) Teeth Intact and Dental Advisory Given   Pulmonary          Cardiovascular Exercise Tolerance: Good + DOE + dysrhythmias Atrial Fibrillation Rhythm:Irregular Rate:Normal     Neuro/Psych negative neurological ROS  negative psych ROS   GI/Hepatic Neg liver ROS, hiatal hernia, GERD-  Medicated and Controlled,  Endo/Other  Hypothyroidism   Renal/GU negative Renal ROS     Musculoskeletal  (+) Arthritis -, Osteoarthritis,    Abdominal (+) + obese,   Peds negative pediatric ROS (+)  Hematology  (+) Blood dyscrasia (ASA), ,   Anesthesia Other Findings   Reproductive/Obstetrics negative OB ROS                          Anesthesia Physical Anesthesia Plan  ASA: II  Anesthesia Plan: General   Post-op Pain Management: MAC Combined w/ Regional for Post-op pain   Induction: Intravenous  Airway Management Planned: LMA  Additional Equipment:   Intra-op Plan:   Post-operative Plan: Extubation in OR  Informed Consent: I have reviewed the patients History and Physical, chart, labs and discussed the procedure including the risks, benefits and alternatives for the proposed anesthesia with the patient or authorized representative who has indicated his/her understanding and acceptance.   Dental advisory given  Plan Discussed with: CRNA and Surgeon  Anesthesia Plan Comments:        Anesthesia Quick Evaluation

## 2012-01-16 NOTE — Transfer of Care (Signed)
Immediate Anesthesia Transfer of Care Note  Patient: April Padilla  Procedure(s) Performed: Procedure(s) (LRB): TOTAL KNEE ARTHROPLASTY (Left)  Patient Location: PACU  Anesthesia Type: GA combined with regional for post-op pain  Level of Consciousness: awake, alert , oriented and patient cooperative  Airway & Oxygen Therapy: Patient Spontanous Breathing and Patient connected to nasal cannula oxygen  Post-op Assessment: Report given to PACU RN, Post -op Vital signs reviewed and stable and Patient moving all extremities X 4  Post vital signs: Reviewed and stable  Complications: No apparent anesthesia complications

## 2012-01-16 NOTE — H&P (Signed)
  April Padilla MRN:  [id number] DOB/SEX:  Oct 18, 1946/female  CHIEF COMPLAINT:  Painful left Knee  HISTORY: Patient is a 66 y.o. female presented with a history of pain in the left knee. Onset of symptoms was gradual starting several years ago with gradually worsening course since that time. The patient noted no past surgery on the left knee. Prior procedures on the knee include arthroscopy. Patient has been treated conservatively with over-the-counter NSAIDs and activity modification. Patient currently rates pain in the knee at 10 out of 10 with activity. There is pain at night.  PAST MEDICAL HISTORY: There are no active problems to display for this patient.  Past Medical History  Diagnosis Date  . Shortness of breath     with exertion  . H/O hiatal hernia   . Dysrhythmia     on toprol for fast heart beat  . Hypothyroidism     pt. states she took radioactive pill and has no thyroid   Past Surgical History  Procedure Date  . Knee arthroscopy w/ meniscal repair 2009    left  . Foot surgery 2009    took knot out  and put in screws  . Bunionectomy yrs. ago    left foot     MEDICATIONS:   No prescriptions prior to admission    ALLERGIES:   Allergies  Allergen Reactions  . Clindamycin/Lincomycin Rash  . Macrobid Rash  . Silver Sulfadiazine Rash    REVIEW OF SYSTEMS:  Pertinent items are noted in HPI.   FAMILY HISTORY:   Family History  Problem Relation Age of Onset  . Anesthesia problems Neg Hx   . Hypotension Neg Hx   . Malignant hyperthermia Neg Hx   . Pseudochol deficiency Neg Hx     SOCIAL HISTORY:   History  Substance Use Topics  . Smoking status: Never Smoker   . Smokeless tobacco: Not on file  . Alcohol Use: Yes     occasional     EXAMINATION:  Vital signs in last 24 hours:    General appearance: alert, cooperative and no distress Lungs: clear to auscultation bilaterally Heart: regular rate and rhythm, S1, S2 normal, no murmur, click, rub or  gallop Abdomen: soft, non-tender; bowel sounds normal; no masses,  no organomegaly Extremities: extremities normal, atraumatic, no cyanosis or edema and Homans sign is negative, no sign of DVT Pulses: 2+ and symmetric Skin: Skin color, texture, turgor normal. No rashes or lesions Neurologic: Alert and oriented X 3, normal strength and tone. Normal symmetric reflexes. Normal coordination and gait  Musculoskeletal:  ROM 0-110, Ligaments intact,  Imaging Review Plain radiographs demonstrate severe degenerative joint disease of the left knee. The overall alignment is mild varus. The bone quality appears to be good for age and reported activity level.  Assessment/Plan: End stage arthritis, left knee   The patient history, physical examination and imaging studies are consistent with advanced degenerative joint disease of the left knee. The patient has failed conservative treatment.  The clearance notes were reviewed.  After discussion with the patient it was felt that Total Knee Replacement was indicated. The procedure,  risks, and benefits of total knee arthroplasty were presented and reviewed. The risks including but not limited to aseptic loosening, infection, blood clots, vascular injury, stiffness, patella tracking problems complications among others were discussed. The patient acknowledged the explanation, agreed to proceed with the plan.  April Padilla 01/16/2012, 7:16 AM

## 2012-01-16 NOTE — Preoperative (Signed)
Beta Blockers   Reason not to administer Beta Blockers:Not Applicable 

## 2012-01-16 NOTE — Progress Notes (Signed)
Report to S. Knight RN as primary caregiver. 

## 2012-01-17 ENCOUNTER — Encounter (HOSPITAL_COMMUNITY): Payer: Self-pay | Admitting: Orthopedic Surgery

## 2012-01-17 LAB — BASIC METABOLIC PANEL
BUN: 12 mg/dL (ref 6–23)
Calcium: 9 mg/dL (ref 8.4–10.5)
Creatinine, Ser: 0.67 mg/dL (ref 0.50–1.10)
GFR calc Af Amer: >90 mL/min (ref >90)
GFR calc non Af Amer: 89 mL/min — ABNORMAL LOW (ref >90)

## 2012-01-17 LAB — CBC
HCT: 29.3 % — ABNORMAL LOW (ref 36.0–46.0)
MCHC: 33.1 g/dL (ref 30.0–36.0)
MCV: 90.7 fL (ref 78.0–100.0)
RDW: 12.1 % (ref 11.5–15.5)

## 2012-01-17 MED ORDER — PANTOPRAZOLE SODIUM 40 MG PO TBEC
40.0000 mg | DELAYED_RELEASE_TABLET | Freq: Every day | ORAL | Status: DC
  Administered 2012-01-17: 40 mg via ORAL

## 2012-01-17 MED ORDER — FAMOTIDINE 20 MG PO TABS
20.0000 mg | ORAL_TABLET | Freq: Every day | ORAL | Status: DC
  Administered 2012-01-18: 20 mg via ORAL
  Filled 2012-01-17 (×2): qty 1

## 2012-01-17 NOTE — Progress Notes (Signed)
Occupational Therapy Evaluation Patient Details Name: April Padilla MRN: [id number] DOB: 18-Sep-1946 Today's Date: 01/17/2012  Problem List: There is no problem list on file for this patient.   Past Medical History:  Past Medical History  Diagnosis Date  . Shortness of breath     with exertion  . H/O hiatal hernia   . Dysrhythmia     on toprol for fast heart beat  . Hypothyroidism     pt. states she took radioactive pill and has no thyroid   Past Surgical History:  Past Surgical History  Procedure Date  . Knee arthroscopy w/ meniscal repair 2009    left  . Foot surgery 2009    took knot out  and put in screws  . Bunionectomy yrs. ago    left foot    OT Assessment/Plan/Recommendation OT Assessment Clinical Impression Statement: 66 yo s/p L TKA. WBAT Pt will benefit from skilled OT services to max indep with ADL and mobility for ADL with nec AE and DME to facilitate safe D/C home with husband. No DME needs. Has all nec equipment. OT Recommendation/Assessment: Patient will need skilled OT in the acute care venue OT Problem List: Decreased strength;Decreased range of motion;Decreased activity tolerance;Decreased knowledge of use of DME or AE;Pain Barriers to Discharge: None OT Therapy Diagnosis : Generalized weakness;Acute pain OT Plan OT Frequency: Min 2X/week OT Treatment/Interventions: Self-care/ADL training;DME and/or AE instruction;Patient/family education OT Recommendation Follow Up Recommendations: No OT follow up Equipment Recommended: None recommended by OT Individuals Consulted Consulted and Agree with Results and Recommendations: Patient OT Goals Acute Rehab OT Goals OT Goal Formulation: With patient Time For Goal Achievement: 7 days ADL Goals Pt Will Perform Lower Body Bathing: with modified independence;Sit to stand from chair;Unsupported;with adaptive equipment ADL Goal: Lower Body Bathing - Progress: Goal set today Pt Will Perform Lower Body Dressing:  with modified independence;Sit to stand from chair;with adaptive equipment;Unsupported ADL Goal: Lower Body Dressing - Progress: Goal set today Pt Will Perform Tub/Shower Transfer: with supervision;Tub transfer;Ambulation;Transfer tub bench;with cueing (comment type and amount) ADL Goal: Tub/Shower Transfer - Progress: Goal set today  OT Evaluation Precautions/Restrictions  Precautions Required Braces or Orthoses: No Restrictions Weight Bearing Restrictions: Yes LLE Weight Bearing: Weight bearing as tolerated Prior Functioning Home Living Lives With: Spouse Receives Help From: Family Type of Home: House Home Layout: One level Home Access: Stairs to enter Entrance Stairs-Rails: None Entrance Stairs-Number of Steps: 2 Bathroom Shower/Tub: Tub/shower unit;Walk-in shower Bathroom Toilet: Standard Bathroom Accessibility: Yes How Accessible: Accessible via walker Home Adaptive Equipment: Bedside commode/3-in-1;Walker - rolling;Tub transfer bench (pt reports she has access to tub seat) Prior Function Level of Independence: Independent with homemaking with ambulation Able to Take Stairs?: Yes Driving: Yes Comments: husband is retired but plays golf often ADL ADL Eating/Feeding: Simulated;Independent Grooming: Simulated;Set up Where Assessed - Grooming: Sitting, chair Upper Body Bathing: Simulated;Set up Where Assessed - Upper Body Bathing: Sitting, chair Lower Body Bathing: Simulated;Moderate assistance Where Assessed - Lower Body Bathing: Sit to stand from chair Upper Body Dressing: Simulated;Set up Where Assessed - Upper Body Dressing: Sitting, chair Lower Body Dressing: Moderate assistance Where Assessed - Lower Body Dressing: Sit to stand from chair Toilet Transfer: Performed;Supervision/safety Toilet Transfer Method: Proofreader: Bedside commode Toileting - Clothing Manipulation: Performed;Independent Where Assessed - Toileting Clothing  Manipulation: Standing Toileting - Hygiene: Performed;Independent Where Assessed - Toileting Hygiene: Standing Tub/Shower Transfer: Not assessed Equipment Used: Long-handled sponge;Reacher;Rolling walker;Sock aid Ambulation Related to ADLs: supervision ADL Comments: educated pt  on AE Vision/Perception  Vision - History Baseline Vision: Wears glasses all the time Perception Perception: Within Functional Limits Praxis Praxis: Intact Cognition Cognition Arousal/Alertness: Awake/alert Overall Cognitive Status: Appears within functional limits for tasks assessed Orientation Level: Oriented to person;Oriented to place;Oriented to time Sensation/Coordination Sensation Light Touch: Appears Intact Coordination Gross Motor Movements are Fluid and Coordinated: Yes Fine Motor Movements are Fluid and Coordinated: Yes Extremity Assessment RUE Assessment RUE Assessment: Within Functional Limits LUE Assessment LUE Assessment: Within Functional Limits Mobility  Bed Mobility Bed Mobility: No Transfers Transfers: Yes (supervision) Exercises   End of Session OT - End of Session Equipment Utilized During Treatment: Gait belt Activity Tolerance: Patient tolerated treatment well Patient left: in chair;with call bell in reach General Behavior During Session: [facility] for tasks performed Cognition: [place] for tasks performed   [facility] 01/17/2012, 10:30 AM  [facility], OTR/L  [phone] 01/17/2012

## 2012-01-17 NOTE — Progress Notes (Signed)
Physical Therapy Evaluation Patient Details Name: April Padilla MRN: [id number] DOB: January 18, 1946 Today's Date: 01/17/2012  Problem List: There is no problem list on file for this patient.   Past Medical History:  Past Medical History  Diagnosis Date  . Shortness of breath     with exertion  . H/O hiatal hernia   . Dysrhythmia     on toprol for fast heart beat  . Hypothyroidism     pt. states she took radioactive pill and has no thyroid   Past Surgical History:  Past Surgical History  Procedure Date  . Knee arthroscopy w/ meniscal repair 2009    left  . Foot surgery 2009    took knot out  and put in screws  . Bunionectomy yrs. ago    left foot  . Total knee arthroplasty 01/16/2012    Procedure: TOTAL KNEE ARTHROPLASTY;  Surgeon: Raymon Mutton, MD;  Location: [facility] OR;  Service: Orthopedics;  Laterality: Left;    PT Assessment/Plan/Recommendation PT Assessment Clinical Impression Statement: 66 yo female s/p LTKA presents with decr functional mobility; will benefit from acute PT services to maximize independence and safety with mobility, amb, steps, therex to enable safe dc home PT Recommendation/Assessment: Patient will need skilled PT in the acute care venue PT Problem List: Decreased strength;Decreased range of motion;Decreased activity tolerance;Decreased mobility;Decreased knowledge of use of DME;Pain PT Therapy Diagnosis : Difficulty walking;Acute pain PT Plan PT Frequency: 7X/week PT Treatment/Interventions: DME instruction;Gait training;Stair training;Functional mobility training;Therapeutic activities;Therapeutic exercise;Patient/family education PT Recommendation Recommendations for Other Services: OT consult Follow Up Recommendations: Home health PT;Supervision/Assistance - 24 hour Equipment Recommended: None recommended by PT PT Goals  Acute Rehab PT Goals PT Goal Formulation: With patient Time For Goal Achievement: 7 days Pt will go Supine/Side to Sit: with  modified independence PT Goal: Supine/Side to Sit - Progress: Goal set today Pt will go Sit to Supine/Side: with modified independence PT Goal: Sit to Supine/Side - Progress: Goal set today Pt will go Sit to Stand: with supervision PT Goal: Sit to Stand - Progress: Goal set today Pt will go Stand to Sit: with supervision PT Goal: Stand to Sit - Progress: Goal set today Pt will Ambulate: >150 feet;with supervision;with rolling walker PT Goal: Ambulate - Progress: Goal set today Pt will Go Up / Down Stairs: 1-2 stairs;with min assist;with rolling walker PT Goal: Up/Down Stairs - Progress: Goal set today Pt will Perform Home Exercise Program: with supervision, verbal cues required/provided PT Goal: Perform Home Exercise Program - Progress: Goal set today  PT Evaluation Precautions/Restrictions  Precautions Precautions: Knee Required Braces or Orthoses: No Restrictions Weight Bearing Restrictions: Yes LLE Weight Bearing: Weight bearing as tolerated Prior Functioning  Home Living Lives With: Spouse Receives Help From: Family Type of Home: House Home Layout: One level Home Access: Stairs to enter Entrance Stairs-Rails: None Entrance Stairs-Number of Steps: 2 Bathroom Shower/Tub: Tub/shower unit;Walk-in shower Bathroom Toilet: Standard Bathroom Accessibility: Yes How Accessible: Accessible via walker Home Adaptive Equipment: Bedside commode/3-in-1;Walker - rolling;Tub transfer bench Prior Function Level of Independence: Independent with homemaking with ambulation Able to Take Stairs?: Yes Driving: Yes Comments: husband is retired but plays golf often Financial risk analyst Arousal/Alertness: Awake/alert Overall Cognitive Status: Appears within functional limits for tasks assessed Orientation Level: Oriented X4 Sensation/Coordination Sensation Light Touch: Appears Intact Coordination Gross Motor Movements are Fluid and Coordinated: Yes Fine Motor Movements are Fluid and  Coordinated: Yes Extremity Assessment RUE Assessment RUE Assessment: Within Functional Limits LUE Assessment LUE Assessment: Within Functional  Limits RLE Assessment RLE Assessment: Within Functional Limits LLE Assessment LLE Assessment:  (Decr AROM and strength, limited by pain postop  Mobility (including Balance) Bed Mobility Bed Mobility: Yes Supine to Sit: 4: Min assist;With rails;HOB flat Supine to Sit Details (indicate cue type and reason): cues for technique, safety Sitting - Scoot to Edge of Bed: 4: Min assist Conservation officer, nature without hysical contact) Sitting - Scoot to [place] of Bed Details (indicate cue type and reason): cues for safety Transfers Transfers: Yes Sit to Stand: 4: Min assist;From bed;With upper extremity assist Sit to Stand Details (indicate cue type and reason): cues for safety, hand placement Stand to Sit: 4: Min assist;To chair/3-in-1;With upper extremity assist Stand to Sit Details: cues for safety, hand placement, and optimal positioning of LLE for comfort Ambulation/Gait Ambulation/Gait: Yes Ambulation/Gait Assistance: 4: Min assist (Guard assist with and without physical contact) Ambulation/Gait Assistance Details (indicate cue type and reason): cues for gait sequence and to activate L quad for stance stability Ambulation Distance (Feet): 70 Feet Assistive device: Rolling walker Gait Pattern: Step-through pattern (Emerging)    Exercise  Total Joint Exercises Quad Sets: AROM;Left;10 reps;Supine Heel Slides: AAROM;Left;5 reps;Supine End of Session PT - End of Session Equipment Utilized During Treatment: Gait belt Activity Tolerance: Patient tolerated treatment well Patient left: in chair;with call bell in reach General Behavior During Session: [facility] for tasks performed Cognition: [facility] for tasks performed  Van Clines [facility] [place], [place] [phone]  01/17/2012, 12:23 PM

## 2012-01-17 NOTE — Progress Notes (Signed)
CARE MANAGEMENT NOTE 01/17/2012      Action/Plan:   Discharge Planning. Spoke with patient, choice offered. Patient has a rolling walker and 3in1, CPM to be delivered to the home.   Anticipated DC Date:  01/19/2012   Anticipated DC Plan:  HOME W HOME HEALTH SERVICES      DC Planning Services  CM consult      [facility] Choice  HOME HEALTH   Choice offered to / List presented to:  C-1 Patient        HH arranged  HH-2 PT      [facility] agency  [facility]   Status of service:  Completed, signed off Discharge Disposition:  HOME W HOME HEALTH SERVICES

## 2012-01-17 NOTE — Progress Notes (Signed)
Georgena Spurling, MD   Altamese Cabal, PA-C [address] [place], [place], [place]  [address]                             [phone]   PROGRESS NOTE  Subjective:  negative for Chest Pain  negative for Shortness of Breath  negative for Nausea/Vomiting   negative for Calf Pain  negative for Bowel Movement   Tolerating Diet: yes         Patient reports pain as 4 on 0-10 scale and 5 on 0-10 scale.    Objective: Vital signs in last 24 hours:   Patient Vitals for the past 24 hrs:  BP Temp Temp src Pulse Resp SpO2  01/17/12 0549 109/56 mmHg 97.5 F (36.4 C) Oral 84  18  100 %  01/17/12 0135 118/67 mmHg 97.5 F (36.4 C) Oral 79  18  99 %  01/16/12 2053 113/62 mmHg 97.5 F (36.4 C) - 73  18  97 %  01/16/12 1845 111/62 mmHg 97.5 F (36.4 C) - 73  18  97 %  01/16/12 1515 120/68 mmHg 97.6 F (36.4 C) Oral 81  14  97 %  01/16/12 1500 122/63 mmHg 98.1 F (36.7 C) - 82  12  97 %  01/16/12 1445 125/63 mmHg - - 75  7  96 %  01/16/12 1430 112/64 mmHg - - 76  7  94 %  01/16/12 1415 - - - 80  12  97 %  01/16/12 1413 - - - 81  13  96 %  01/16/12 1410 138/71 mmHg - - 78  11  96 %  01/16/12 1400 140/83 mmHg 97.8 F (36.6 C) - 117  12  95 %  01/16/12 1102 - - - 80  14  97 %  01/16/12 1045 - - - 91  18  98 %  01/16/12 0902 143/89 mmHg 97.9 F (36.6 C) Oral 82  18  98 %    @flow {1959:LAST@   Intake/Output from previous day:   03/18 0701 - 03/19 0700 In: 1667.5 [I.V.:1612.5] Out: 1925 [Urine:1250; Drains:600]   Intake/Output this shift:       Intake/Output      03/18 0701 - 03/19 0700 03/19 0701 - 03/20 0700   I.V. 1612.5    IV Piggyback 55    Total Intake 1667.5    Urine 1250    Drains 600    Blood 75    Total Output 1925    Net -257.5            LABORATORY DATA:  Basename 01/17/12 0520  WBC 11.7*  HGB 9.7*  HCT 29.3*  PLT 233    Basename 01/17/12 0520  NA 136  K 4.1  CL 100  CO2 27  BUN 12  CREATININE 0.67  GLUCOSE 145*  CALCIUM 9.0   Lab Results    Component Value Date   INR 0.92 01/06/2012   INR 0.88 12/13/2011    Examination:  General appearance: alert, cooperative and no distress Extremities: Homans sign is negative, no sign of DVT  Wound Exam: clean, dry, intact   Drainage:  Scant/small amount Serosanguinous exudate  Motor Exam: EHL and FHL Intact  Sensory Exam: Deep Peroneal normal  Vascular Exam:    Assessment:    1 Day Post-Op  Procedure(s) (LRB): TOTAL KNEE ARTHROPLASTY (Left)  ADDITIONAL DIAGNOSIS:  Active Problems:  * No active hospital problems. *  Acute Blood Loss Anemia   Plan: Physical Therapy as ordered Weight Bearing as Tolerated (WBAT)  DVT Prophylaxis:  Lovenox  DISCHARGE PLAN: Home  DISCHARGE NEEDS: HHPT, CPM, Walker and 3-in-1 comode seat         Nataya Bastedo 01/17/2012, 8:05 AM

## 2012-01-17 NOTE — Progress Notes (Signed)
UR COMPLETED  

## 2012-01-17 NOTE — Progress Notes (Signed)
Physical Therapy Note   01/17/12 1716  PT Visit Information  Last PT Received On 01/17/12  Precautions  Precautions Knee  Restrictions  LLE Weight Bearing WBAT  Bed Mobility  Supine to Sit 5: Supervision  Supine to Sit Details (indicate cue type and reason) cues for safety  Sitting - Scoot to Edge of Bed 5: Supervision  Sitting - Scoot to Edge of Bed Details (indicate cue type and reason) cues for safety  Sit to Supine 4: Min assist (guard assistwithout physical contact)  Sit to Supine - Details (indicate cue type and reason) cues for technique, optimal positioning in bed  Transfers  Sit to Stand 5: Supervision;From bed (and from low mat table)  Sit to Stand Details (indicate cue type and reason) cues for hand placement  Stand to Sit 5: Supervision;With upper extremity assist;To bed (and to low mat table)  Stand to Sit Details cues for hand placement  Ambulation/Gait  Ambulation/Gait Assistance 4: Min assist;5: Supervision  Ambulation/Gait Assistance Details (indicate cue type and reason) Minguard assist progressing to supervision; Excellent knee stability; was able to smooth out gait sequence with no buckling  Ambulation Distance (Feet) 200 Feet  Assistive device Rolling walker  Gait Pattern Step-through pattern  Stairs Yes  Stairs Assistance 4: Min assist  Stairs Assistance Details (indicate cue type and reason) verbal and demo cues for technique; husband present and gave correct assist  Stair Management Technique No rails;Backwards;With walker  Number of Stairs 3   Exercises  Exercises Total Joint  Total Joint Exercises  Quad Sets AROM;Left;10 reps;Supine  Heel Slides AROM;Left;10 reps;Supine  Ankle Circles/Pumps AROM;Both;10 reps;Supine  Short Arc Quad AROM;Left;10 reps;Supine  Straight Leg Raises AROM;Left;10 reps;Supine  PT - End of Session  Equipment Utilized During Treatment Gait belt  Activity Tolerance Patient tolerated treatment well  Patient left in bed;in  CPM;with call bell in reach  General  Behavior During Session [facility] for tasks performed  Cognition [facility] for tasks performed  PT - Assessment/Plan  Comments on Treatment Session Excellent mobility; on track for dc home tomorrow  PT Plan Discharge plan remains appropriate  PT Frequency 7X/week  Follow Up Recommendations Home health PT;Supervision/Assistance - 24 hour  Equipment Recommended None recommended by PT  Acute Rehab PT Goals  Time For Goal Achievement 7 days  Pt will go Supine/Side to Sit with modified independence  PT Goal: Supine/Side to Sit - Progress Progressing toward goal  Pt will go Sit to Supine/Side with modified independence  PT Goal: Sit to Supine/Side - Progress Progressing toward goal  Pt will go Sit to Stand with supervision  PT Goal: Sit to Stand - Progress Progressing toward goal  Pt will go Stand to Sit with supervision  PT Goal: Stand to Sit - Progress Progressing toward goal  Pt will Ambulate >150 feet;with supervision;with rolling walker  PT Goal: Ambulate - Progress Progressing toward goal  Pt will Go Up / Down Stairs 1-2 stairs;with min assist;with rolling walker  PT Goal: Up/Down Stairs - Progress Met  Pt will Perform Home Exercise Program with supervision, verbal cues required/provided  PT Goal: Perform Home Exercise Program - Progress Progressing toward goal    [place], [place] [phone]

## 2012-01-18 LAB — CBC
MCHC: 33.3 g/dL (ref 30.0–36.0)
Platelets: 225 10*3/uL (ref 150–400)
RDW: 12.4 % (ref 11.5–15.5)

## 2012-01-18 LAB — BASIC METABOLIC PANEL
BUN: 15 mg/dL (ref 6–23)
GFR calc Af Amer: >90 mL/min (ref >90)
GFR calc non Af Amer: 85 mL/min — ABNORMAL LOW (ref >90)
Potassium: 3.9 mEq/L (ref 3.5–5.1)
Sodium: 138 mEq/L (ref 135–145)

## 2012-01-18 MED ORDER — OXYCODONE HCL 10 MG PO TB12: 10.0000 mg | ORAL_TABLET | Freq: Two times a day (BID) | ORAL | Status: AC

## 2012-01-18 MED ORDER — METHOCARBAMOL 500 MG PO TABS: 500.0000 mg | ORAL_TABLET | Freq: Four times a day (QID) | ORAL | Status: AC | PRN

## 2012-01-18 MED ORDER — OXYCODONE HCL 5 MG PO TABS: 5.0000 mg | ORAL_TABLET | ORAL | Status: AC | PRN

## 2012-01-18 MED ORDER — ENOXAPARIN SODIUM 40 MG/0.4ML ~~LOC~~ SOLN: 40.0000 mg | Freq: Every day | SUBCUTANEOUS | Status: DC

## 2012-01-18 MED ORDER — ENOXAPARIN SODIUM 40 MG/0.4ML ~~LOC~~ SOLN
40.0000 mg | SUBCUTANEOUS | Status: DC
  Filled 2012-01-18: qty 0.4

## 2012-01-18 NOTE — Discharge Summary (Signed)
PATIENT ID:      April Padilla  MRN:     [id number] DOB/AGE:    Feb 05, 1946 / 66 y.o.     DISCHARGE SUMMARY  ADMISSION DATE:    01/16/2012 DISCHARGE DATE:   01/18/2012   ADMISSION DIAGNOSIS: osteoarthritis left knee  (osteoarthritis left knee)  DISCHARGE DIAGNOSIS:  osteoarthritis left knee    ADDITIONAL DIAGNOSIS: Active Problems:  * No active hospital problems. *   Past Medical History  Diagnosis Date  . Shortness of breath     with exertion  . H/O hiatal hernia   . Dysrhythmia     on toprol for fast heart beat  . Hypothyroidism     pt. states she took radioactive pill and has no thyroid    PROCEDURE: Procedure(s): TOTAL KNEE ARTHROPLASTY on 01/16/2012  CONSULTS:     HISTORY:  See H&P in chart  HOSPITAL COURSE:  April Padilla is a 66 y.o. admitted on 01/16/2012 and found to have a diagnosis of osteoarthritis left knee.  After appropriate laboratory studies were obtained  they were taken to the operating room on 01/16/2012 and underwent Procedure(s): TOTAL KNEE ARTHROPLASTY.   They were given perioperative antibiotics:  Anti-infectives     Start     Dose/Rate Route Frequency Ordered Stop   01/16/12 1545   ceFAZolin (ANCEF) IVPB 2 g/50 mL premix        2 g 100 mL/hr over 30 Minutes Intravenous Every 6 hours 01/16/12 1537 02/01/2012 0338   01/16/12 0800   ceFAZolin (ANCEF) IVPB 2 g/50 mL premix        2 g 100 mL/hr over 30 Minutes Intravenous 60 min pre-op 01/15/12 1434 01/16/12 1147        .  Tolerated the procedure well.  Placed with a foley intraoperatively.  Given Ofirmev at induction and for 48 hours.  PCA for analgesia.   POD #1, allowed out of bed to a chair.  PT for ambulation and exercise program.  Foley D/C'd in morning.  IV saline locked.  O2 discontionued.  POD #2, continued PT and ambulation .  The remainder of the hospital course was dedicated to ambulation and strengthening.   The patient was discharged on 2 Days Post-Op in  Good  condition.  Blood products given:none  DIAGNOSTIC STUDIES: Recent vital signs: Patient Vitals for the past 24 hrs:  BP Temp Pulse Resp SpO2 Height Weight  01/18/12 0942 121/63 mmHg - 87  - - - -  01/18/12 0523 100/79 mmHg 98.3 F (36.8 C) 87  18  96 % - -  02-01-12 2300 - - - - - 5\' 6"  (1.676 m) 74.6 kg (164 lb 7.4 oz)  02-01-2012 2107 114/52 mmHg 98.8 F (37.1 C) 84  18  97 % - -  02/01/2012 1421 103/46 mmHg 97.8 F (36.6 C) 87  16  98 % - -       Recent laboratory studies:  [facility] 01/18/12 0543 02-01-12 0520  WBC 8.6 11.7*  HGB 9.0* 9.7*  HCT 27.0* 29.3*  PLT 225 233    Basename 01/18/12 0543 Feb 01, 2012 0520  NA 138 136  K 3.9 4.1  CL 102 100  CO2 27 27  BUN 15 12  CREATININE 0.76 0.67  GLUCOSE 100* 145*  CALCIUM 9.1 9.0   Lab Results  Component Value Date   INR 0.92 01/06/2012   INR 0.88 12/13/2011     Recent Radiographic Studies :  No results found.  DISCHARGE INSTRUCTIONS:  Discharge Orders    Future Orders Please Complete By Expires   Diet - low sodium heart healthy      Call MD / Call 911      Comments:   If you experience chest pain or shortness of breath, CALL 911 and be transported to the hospital emergency room.  If you develope a fever above 101 F, pus (white drainage) or increased drainage or redness at the wound, or calf pain, call your surgeon's office.   Constipation Prevention      Comments:   Drink plenty of fluids.  Prune juice may be helpful.  You may use a stool softener, such as Colace (over the counter) 100 mg twice a day.  Use MiraLax (over the counter) for constipation as needed.   Increase activity slowly as tolerated      Weight Bearing as taught in Physical Therapy      Comments:   Use a walker or crutches as instructed.   Driving restrictions      Comments:   No driving for 6 weeks   Lifting restrictions      Comments:   No lifting for 6 weeks   CPM      Comments:   Continuous passive motion machine (CPM):      Use the CPM from 0  to 90 for 6-8 hours per day.      You may increase by 10 per day.  You may break it up into 2 or 3 sessions per day.      Use CPM for 2 weeks or until you are told to stop.   TED hose      Comments:   Use stockings (TED hose) for 3 weeks on both leg(s).  You may remove them at night for sleeping.   Change dressing      Comments:   Change dressing on thursday, then change the dressing daily with sterile 4 x 4 inch gauze dressing and apply TED hose.  You may clean the incision with alcohol prior to redressing.   Do not put a pillow under the knee. Place it under the heel.         DISCHARGE MEDICATIONS:   Medication List  As of 01/18/2012 12:54 PM   STOP taking these medications         aspirin EC 81 MG tablet      fish oil-omega-3 fatty acids 1000 MG capsule         TAKE these medications         atorvastatin 10 MG tablet   Commonly known as: LIPITOR   Take 5 mg by mouth every evening.      CALCIUM PO   Take 1 tablet by mouth daily.      enoxaparin 40 MG/0.4ML injection   Commonly known as: LOVENOX   Inject 0.4 mLs (40 mg total) into the skin daily.      famotidine 20 MG tablet   Commonly known as: PEPCID   Take 20 mg by mouth daily before breakfast. Takes at 0730 daily.      levothyroxine 112 MCG tablet   Commonly known as: SYNTHROID, LEVOTHROID   Take 112 mcg by mouth daily.      methocarbamol 500 MG tablet   Commonly known as: ROBAXIN   Take 1-2 tablets (500-1,000 mg total) by mouth every 6 (six) hours as needed.      metoprolol succinate 25 MG 24 hr tablet   Commonly known as: TOPROL-XL  Take 25 mg by mouth daily.      omeprazole 20 MG capsule   Commonly known as: PRILOSEC   Take 20 mg by mouth daily.      oxyCODONE 5 MG immediate release tablet   Commonly known as: Oxy IR/ROXICODONE   Take 1-2 tablets (5-10 mg total) by mouth every 4 (four) hours as needed.      oxyCODONE 10 MG 12 hr tablet   Commonly known as: OXYCONTIN   Take 1 tablet (10 mg total)  by mouth every 12 (twelve) hours.            FOLLOW UP VISIT:   Follow-up Information    Follow up with Raymon Mutton, MD. Call on 01/31/2012.   Contact information:   [address] [place] [address] [phone]          DISPOSITION:  Home    CONDITION:  Good   April Padilla Lusher 01/18/2012, 12:54 PM

## 2012-01-18 NOTE — Progress Notes (Signed)
Physical Therapy Treatment Patient Details Name: April Padilla MRN: [id number] DOB: 10-22-1946 Today's Date: 01/18/2012  PT Assessment/Plan  PT - Assessment/Plan Comments on Treatment Session: Moving a little slower today secondary to pain, but still steady with all mobility including steps and ok for dc home from PT standpoint PT Plan: Discharge plan remains appropriate PT Frequency: 7X/week Follow Up Recommendations: Home health PT;Supervision/Assistance - 24 hour Equipment Recommended: None recommended by PT PT Goals  Acute Rehab PT Goals Time For Goal Achievement: 7 days Pt will go Sit to Stand: with supervision PT Goal: Sit to Stand - Progress: Met Pt will go Stand to Sit: with supervision PT Goal: Stand to Sit - Progress: Met Pt will Ambulate: >150 feet;with supervision;with rolling walker PT Goal: Ambulate - Progress: Met Pt will Go Up / Down Stairs: 1-2 stairs;with min assist;with rolling walker PT Goal: Up/Down Stairs - Progress: Met Pt will Perform Home Exercise Program: with supervision, verbal cues required/provided PT Goal: Perform Home Exercise Program - Progress: Progressing toward goal  PT Treatment Precautions/Restrictions  Precautions Precautions: Knee Required Braces or Orthoses: No Restrictions Weight Bearing Restrictions: Yes LLE Weight Bearing: Weight bearing as tolerated Mobility (including Balance) Transfers Sit to Stand: 5: Supervision;From chair/3-in-1 Sit to Stand Details (indicate cue type and reason): smooth transistion Stand to Sit: 5: Supervision Stand to Sit Details: smooth transition Ambulation/Gait Ambulation/Gait Assistance: 5: Supervision Ambulation/Gait Assistance Details (indicate cue type and reason): good smooth progression of RW, step-through pattern; a bit slower today, as pt was in more pain than yesterday Ambulation Distance (Feet): 200 Feet Assistive device: Rolling walker Gait Pattern: Step-through pattern Stairs:  Yes Stairs Assistance: 4: Min assist Stairs Assistance Details (indicate cue type and reason): pt was able to verbalize technique, and sequence; provided pt with instructional handout Stair Management Technique: No rails;Backwards;With walker Number of Stairs: 2     Exercise  Total Joint Exercises Long Arc Quad: AROM;Left;10 reps;Seated Knee Flexion: AROM;Left;5 reps;Seated End of Session PT - End of Session Equipment Utilized During Treatment: Gait belt Activity Tolerance: Patient tolerated treatment well Patient left: in bed;in CPM;with call bell in reach Nurse Communication: Mobility status for ambulation;Other (comment) (Cleared PT for dc home) General Behavior During Session: [facility] for tasks performed Cognition: [facility] for tasks performed  [facility] Cl[facility] [place], [place] [phone]  01/18/2012, 12:15 PM

## 2012-01-18 NOTE — Progress Notes (Signed)
  April Spurling, MD   Altamese Cabal, PA-C [address] [place], [place], [place]  [address]                             [phone]   PROGRESS NOTE  Subjective:  negative for Chest Pain  negative for Shortness of Breath  negative for Nausea/Vomiting   negative for Calf Pain  negative for Bowel Movement   Tolerating Diet: yes         Patient reports pain as 2 on 0-10 scale.    Objective: Vital signs in last 24 hours:   Patient Vitals for the past 24 hrs:  BP Temp Pulse Resp SpO2 Height Weight  01/18/12 0942 121/63 mmHg - 87  - - - -  01/18/12 0523 100/79 mmHg 98.3 F (36.8 C) 87  18  96 % - -  01/17/12 2300 - - - - - 5\' 6"  (1.676 m) 74.6 kg (164 lb 7.4 oz)  01/17/12 2107 114/52 mmHg 98.8 F (37.1 C) 84  18  97 % - -  01/17/12 1421 103/46 mmHg 97.8 F (36.6 C) 87  16  98 % - -    @flow {1959:LAST@   Intake/Output from previous day:   03/19 0701 - 03/20 0700 In: 825 [I.V.:825] Out: 600 [Urine:600]   Intake/Output this shift:       Intake/Output      03/19 0701 - 03/20 0700 03/20 0701 - 03/21 0700   I.V. (mL/kg) 825 (11.1)    IV Piggyback     Total Intake(mL/kg) 825 (11.1)    Urine (mL/kg/hr) 600 (0.3)    Drains     Blood     Total Output 600    Net +225            LABORATORY DATA:  Basename 01/18/12 0543 01/17/12 0520  WBC 8.6 11.7*  HGB 9.0* 9.7*  HCT 27.0* 29.3*  PLT 225 233    Basename 01/18/12 0543 01/17/12 0520  NA 138 136  K 3.9 4.1  CL 102 100  CO2 27 27  BUN 15 12  CREATININE 0.76 0.67  GLUCOSE 100* 145*  CALCIUM 9.1 9.0   Lab Results  Component Value Date   INR 0.92 01/06/2012   INR 0.88 12/13/2011    Examination:  General appearance: alert, cooperative and no distress Extremities: Homans sign is negative, no sign of DVT  Wound Exam: clean, dry, intact   Drainage:  None: wound tissue dry  Motor Exam: EHL and FHL Intact  Sensory Exam: Deep Peroneal normal  Vascular Exam:    Assessment:    2 Days Post-Op  Procedure(s)  (LRB): TOTAL KNEE ARTHROPLASTY (Left)  ADDITIONAL DIAGNOSIS:  Active Problems:  * No active hospital problems. *   Acute Blood Loss Anemia   Plan: Physical Therapy as ordered Weight Bearing as Tolerated (WBAT)  DVT Prophylaxis:  Lovenox  DISCHARGE PLAN: Home  DISCHARGE NEEDS: HHPT, CPM, Walker and 3-in-1 comode seat         Sonika Levins 01/18/2012, 12:47 PM

## 2012-01-18 NOTE — Discharge Instructions (Signed)
Home Health to be provided by [facility] [phone]  Diet: As you were doing prior to hospitalization   Activity:  Increase activity slowly as tolerated                  No lifting or driving for 6 weeks  Shower:  May shower without a dressing once there is no drainage from your wound.                 Do NOT wash over the wound.  If drainage remains, cover wound with saran                  Wrap and then shower.  Clean incision with betadine and change dressing                        After saran wrap removed.  Dressing:  You may change your dressing on Thursday                    Then change the dressing daily with sterile 4"x4"s gauze dressing                     And TED hose for knees.  Use paper tape to hold dressing in place                     For hips.  You may clean the incision with alcohol prior to redressing.  Weight Bearing:  Weight bearing as tolerated as taught in physical therapy.  Use a                                walker or Crutches as instructed.  To prevent constipation: you may use a stool softener such as -               Colace ( over the counter) 100 mg by mouth twice a day                Drink plenty of fluids ( prune juice may be helpful) and high fiber foods                Miralax ( over the counter) for constipation as needed.    Precautions:  If you experience chest pain or shortness of breath - call 911 immediately               For transfer to the hospital emergency department!!               If you develop a fever greater that 101 F, purulent drainage from wound,                             increased redness or drainage from wound, or calf pain -- Call the office at                                                 [phone].  Follow- Up Appointment:  Please call for an appointment to be seen on 01/31/12  [place] - [phone]                   

## 2012-12-27 DIAGNOSIS — Z96659 Presence of unspecified artificial knee joint: Secondary | ICD-10-CM | POA: Insufficient documentation

## 2012-12-27 HISTORY — DX: Presence of unspecified artificial knee joint: Z96.659

## 2013-09-19 ENCOUNTER — Other Ambulatory Visit: Payer: Self-pay | Admitting: Gynecology

## 2014-11-10 DIAGNOSIS — I481 Persistent atrial fibrillation: Secondary | ICD-10-CM | POA: Diagnosis not present

## 2014-11-10 DIAGNOSIS — I471 Supraventricular tachycardia: Secondary | ICD-10-CM | POA: Diagnosis not present

## 2014-11-10 DIAGNOSIS — E785 Hyperlipidemia, unspecified: Secondary | ICD-10-CM | POA: Diagnosis not present

## 2014-11-20 DIAGNOSIS — E663 Overweight: Secondary | ICD-10-CM | POA: Diagnosis not present

## 2014-11-20 DIAGNOSIS — Z79899 Other long term (current) drug therapy: Secondary | ICD-10-CM | POA: Diagnosis not present

## 2014-11-20 DIAGNOSIS — I10 Essential (primary) hypertension: Secondary | ICD-10-CM | POA: Diagnosis not present

## 2014-11-20 DIAGNOSIS — R300 Dysuria: Secondary | ICD-10-CM | POA: Diagnosis not present

## 2014-11-20 DIAGNOSIS — Z780 Asymptomatic menopausal state: Secondary | ICD-10-CM | POA: Diagnosis not present

## 2014-11-20 DIAGNOSIS — R350 Frequency of micturition: Secondary | ICD-10-CM | POA: Diagnosis not present

## 2014-11-20 DIAGNOSIS — I481 Persistent atrial fibrillation: Secondary | ICD-10-CM | POA: Diagnosis not present

## 2014-11-20 DIAGNOSIS — E785 Hyperlipidemia, unspecified: Secondary | ICD-10-CM | POA: Diagnosis not present

## 2014-11-20 DIAGNOSIS — E039 Hypothyroidism, unspecified: Secondary | ICD-10-CM | POA: Diagnosis not present

## 2014-11-20 DIAGNOSIS — K222 Esophageal obstruction: Secondary | ICD-10-CM | POA: Diagnosis not present

## 2014-11-20 DIAGNOSIS — M1990 Unspecified osteoarthritis, unspecified site: Secondary | ICD-10-CM | POA: Diagnosis not present

## 2014-11-20 DIAGNOSIS — I471 Supraventricular tachycardia: Secondary | ICD-10-CM | POA: Diagnosis not present

## 2014-11-21 DIAGNOSIS — I481 Persistent atrial fibrillation: Secondary | ICD-10-CM | POA: Diagnosis not present

## 2014-11-21 DIAGNOSIS — I480 Paroxysmal atrial fibrillation: Secondary | ICD-10-CM | POA: Diagnosis not present

## 2014-11-21 DIAGNOSIS — Z79899 Other long term (current) drug therapy: Secondary | ICD-10-CM | POA: Diagnosis not present

## 2014-11-21 DIAGNOSIS — E785 Hyperlipidemia, unspecified: Secondary | ICD-10-CM | POA: Diagnosis not present

## 2014-11-21 DIAGNOSIS — E039 Hypothyroidism, unspecified: Secondary | ICD-10-CM | POA: Diagnosis not present

## 2014-11-21 DIAGNOSIS — F419 Anxiety disorder, unspecified: Secondary | ICD-10-CM | POA: Diagnosis not present

## 2014-11-21 DIAGNOSIS — Z6828 Body mass index (BMI) 28.0-28.9, adult: Secondary | ICD-10-CM | POA: Diagnosis not present

## 2014-11-21 DIAGNOSIS — E663 Overweight: Secondary | ICD-10-CM | POA: Diagnosis not present

## 2014-11-21 DIAGNOSIS — M1990 Unspecified osteoarthritis, unspecified site: Secondary | ICD-10-CM | POA: Diagnosis not present

## 2014-11-21 DIAGNOSIS — I10 Essential (primary) hypertension: Secondary | ICD-10-CM | POA: Diagnosis not present

## 2014-11-21 DIAGNOSIS — K219 Gastro-esophageal reflux disease without esophagitis: Secondary | ICD-10-CM | POA: Diagnosis not present

## 2014-11-21 DIAGNOSIS — I2510 Atherosclerotic heart disease of native coronary artery without angina pectoris: Secondary | ICD-10-CM | POA: Diagnosis not present

## 2014-11-21 DIAGNOSIS — Z7901 Long term (current) use of anticoagulants: Secondary | ICD-10-CM | POA: Diagnosis not present

## 2014-11-25 DIAGNOSIS — K219 Gastro-esophageal reflux disease without esophagitis: Secondary | ICD-10-CM | POA: Diagnosis not present

## 2014-11-25 DIAGNOSIS — I482 Chronic atrial fibrillation: Secondary | ICD-10-CM | POA: Diagnosis not present

## 2014-11-25 DIAGNOSIS — Z6828 Body mass index (BMI) 28.0-28.9, adult: Secondary | ICD-10-CM | POA: Diagnosis not present

## 2014-11-25 DIAGNOSIS — M1990 Unspecified osteoarthritis, unspecified site: Secondary | ICD-10-CM | POA: Diagnosis not present

## 2014-11-25 DIAGNOSIS — E785 Hyperlipidemia, unspecified: Secondary | ICD-10-CM | POA: Diagnosis not present

## 2014-11-25 DIAGNOSIS — F419 Anxiety disorder, unspecified: Secondary | ICD-10-CM | POA: Diagnosis not present

## 2014-11-25 DIAGNOSIS — Z7901 Long term (current) use of anticoagulants: Secondary | ICD-10-CM | POA: Diagnosis not present

## 2014-11-25 DIAGNOSIS — E039 Hypothyroidism, unspecified: Secondary | ICD-10-CM | POA: Diagnosis not present

## 2014-11-25 DIAGNOSIS — Z79899 Other long term (current) drug therapy: Secondary | ICD-10-CM | POA: Diagnosis not present

## 2014-11-25 DIAGNOSIS — E663 Overweight: Secondary | ICD-10-CM | POA: Diagnosis not present

## 2014-11-25 DIAGNOSIS — I10 Essential (primary) hypertension: Secondary | ICD-10-CM | POA: Diagnosis not present

## 2014-11-25 DIAGNOSIS — I481 Persistent atrial fibrillation: Secondary | ICD-10-CM | POA: Diagnosis not present

## 2014-11-26 DIAGNOSIS — E785 Hyperlipidemia, unspecified: Secondary | ICD-10-CM | POA: Diagnosis not present

## 2014-11-26 DIAGNOSIS — I481 Persistent atrial fibrillation: Secondary | ICD-10-CM | POA: Diagnosis not present

## 2014-11-26 DIAGNOSIS — Z79899 Other long term (current) drug therapy: Secondary | ICD-10-CM | POA: Diagnosis not present

## 2014-11-26 DIAGNOSIS — I10 Essential (primary) hypertension: Secondary | ICD-10-CM | POA: Diagnosis not present

## 2014-11-26 DIAGNOSIS — K219 Gastro-esophageal reflux disease without esophagitis: Secondary | ICD-10-CM | POA: Diagnosis not present

## 2014-11-26 DIAGNOSIS — Z7901 Long term (current) use of anticoagulants: Secondary | ICD-10-CM | POA: Diagnosis not present

## 2014-12-01 DIAGNOSIS — Z7901 Long term (current) use of anticoagulants: Secondary | ICD-10-CM | POA: Diagnosis not present

## 2014-12-01 DIAGNOSIS — E039 Hypothyroidism, unspecified: Secondary | ICD-10-CM | POA: Diagnosis not present

## 2014-12-01 DIAGNOSIS — R079 Chest pain, unspecified: Secondary | ICD-10-CM | POA: Diagnosis not present

## 2014-12-01 DIAGNOSIS — I517 Cardiomegaly: Secondary | ICD-10-CM | POA: Diagnosis not present

## 2014-12-01 DIAGNOSIS — R0600 Dyspnea, unspecified: Secondary | ICD-10-CM | POA: Diagnosis not present

## 2014-12-01 DIAGNOSIS — E780 Pure hypercholesterolemia: Secondary | ICD-10-CM | POA: Diagnosis not present

## 2014-12-01 DIAGNOSIS — J9811 Atelectasis: Secondary | ICD-10-CM | POA: Diagnosis not present

## 2014-12-01 DIAGNOSIS — J918 Pleural effusion in other conditions classified elsewhere: Secondary | ICD-10-CM | POA: Diagnosis not present

## 2014-12-01 DIAGNOSIS — I4891 Unspecified atrial fibrillation: Secondary | ICD-10-CM | POA: Diagnosis not present

## 2014-12-01 DIAGNOSIS — Z79899 Other long term (current) drug therapy: Secondary | ICD-10-CM | POA: Diagnosis not present

## 2014-12-01 DIAGNOSIS — R05 Cough: Secondary | ICD-10-CM | POA: Diagnosis not present

## 2014-12-01 DIAGNOSIS — I361 Nonrheumatic tricuspid (valve) insufficiency: Secondary | ICD-10-CM | POA: Diagnosis not present

## 2014-12-01 DIAGNOSIS — I340 Nonrheumatic mitral (valve) insufficiency: Secondary | ICD-10-CM | POA: Diagnosis not present

## 2014-12-01 DIAGNOSIS — J90 Pleural effusion, not elsewhere classified: Secondary | ICD-10-CM | POA: Diagnosis not present

## 2014-12-01 DIAGNOSIS — R0789 Other chest pain: Secondary | ICD-10-CM | POA: Diagnosis not present

## 2014-12-03 DIAGNOSIS — I10 Essential (primary) hypertension: Secondary | ICD-10-CM | POA: Diagnosis not present

## 2014-12-03 DIAGNOSIS — I481 Persistent atrial fibrillation: Secondary | ICD-10-CM | POA: Diagnosis not present

## 2014-12-03 DIAGNOSIS — L509 Urticaria, unspecified: Secondary | ICD-10-CM | POA: Diagnosis not present

## 2014-12-05 DIAGNOSIS — L509 Urticaria, unspecified: Secondary | ICD-10-CM | POA: Diagnosis not present

## 2014-12-05 DIAGNOSIS — I481 Persistent atrial fibrillation: Secondary | ICD-10-CM | POA: Diagnosis not present

## 2014-12-16 DIAGNOSIS — I481 Persistent atrial fibrillation: Secondary | ICD-10-CM | POA: Diagnosis not present

## 2014-12-24 DIAGNOSIS — F419 Anxiety disorder, unspecified: Secondary | ICD-10-CM | POA: Diagnosis not present

## 2014-12-24 DIAGNOSIS — I481 Persistent atrial fibrillation: Secondary | ICD-10-CM | POA: Diagnosis not present

## 2014-12-24 DIAGNOSIS — E039 Hypothyroidism, unspecified: Secondary | ICD-10-CM | POA: Diagnosis not present

## 2015-01-15 DIAGNOSIS — R5383 Other fatigue: Secondary | ICD-10-CM | POA: Diagnosis not present

## 2015-01-15 DIAGNOSIS — I4891 Unspecified atrial fibrillation: Secondary | ICD-10-CM | POA: Diagnosis not present

## 2015-01-15 DIAGNOSIS — I481 Persistent atrial fibrillation: Secondary | ICD-10-CM | POA: Diagnosis not present

## 2015-01-15 DIAGNOSIS — I10 Essential (primary) hypertension: Secondary | ICD-10-CM | POA: Diagnosis not present

## 2015-01-22 DIAGNOSIS — M25462 Effusion, left knee: Secondary | ICD-10-CM | POA: Diagnosis not present

## 2015-01-22 DIAGNOSIS — M25562 Pain in left knee: Secondary | ICD-10-CM | POA: Diagnosis not present

## 2015-01-22 DIAGNOSIS — Z96652 Presence of left artificial knee joint: Secondary | ICD-10-CM

## 2015-01-22 HISTORY — DX: Presence of left artificial knee joint: Z96.652

## 2015-01-28 DIAGNOSIS — R002 Palpitations: Secondary | ICD-10-CM | POA: Diagnosis not present

## 2015-01-30 DIAGNOSIS — R002 Palpitations: Secondary | ICD-10-CM | POA: Diagnosis not present

## 2015-01-30 DIAGNOSIS — N3090 Cystitis, unspecified without hematuria: Secondary | ICD-10-CM | POA: Diagnosis not present

## 2015-01-30 DIAGNOSIS — I479 Paroxysmal tachycardia, unspecified: Secondary | ICD-10-CM | POA: Diagnosis not present

## 2015-01-30 DIAGNOSIS — N3001 Acute cystitis with hematuria: Secondary | ICD-10-CM | POA: Diagnosis not present

## 2015-01-30 DIAGNOSIS — I491 Atrial premature depolarization: Secondary | ICD-10-CM | POA: Diagnosis not present

## 2015-03-20 DIAGNOSIS — J209 Acute bronchitis, unspecified: Secondary | ICD-10-CM | POA: Diagnosis not present

## 2015-03-20 DIAGNOSIS — R350 Frequency of micturition: Secondary | ICD-10-CM | POA: Diagnosis not present

## 2015-03-20 DIAGNOSIS — K5909 Other constipation: Secondary | ICD-10-CM | POA: Diagnosis not present

## 2015-03-20 DIAGNOSIS — N390 Urinary tract infection, site not specified: Secondary | ICD-10-CM | POA: Diagnosis not present

## 2015-03-25 DIAGNOSIS — T23061A Burn of unspecified degree of back of right hand, initial encounter: Secondary | ICD-10-CM | POA: Diagnosis not present

## 2015-05-15 DIAGNOSIS — I471 Supraventricular tachycardia: Secondary | ICD-10-CM | POA: Diagnosis not present

## 2015-05-15 DIAGNOSIS — I10 Essential (primary) hypertension: Secondary | ICD-10-CM | POA: Diagnosis not present

## 2015-05-15 DIAGNOSIS — I059 Rheumatic mitral valve disease, unspecified: Secondary | ICD-10-CM | POA: Diagnosis not present

## 2015-05-15 DIAGNOSIS — E039 Hypothyroidism, unspecified: Secondary | ICD-10-CM | POA: Diagnosis not present

## 2015-05-15 DIAGNOSIS — E559 Vitamin D deficiency, unspecified: Secondary | ICD-10-CM | POA: Diagnosis not present

## 2015-05-15 DIAGNOSIS — R5383 Other fatigue: Secondary | ICD-10-CM | POA: Diagnosis not present

## 2015-06-04 DIAGNOSIS — H524 Presbyopia: Secondary | ICD-10-CM | POA: Diagnosis not present

## 2015-06-04 DIAGNOSIS — H2513 Age-related nuclear cataract, bilateral: Secondary | ICD-10-CM | POA: Diagnosis not present

## 2015-06-05 DIAGNOSIS — E039 Hypothyroidism, unspecified: Secondary | ICD-10-CM | POA: Diagnosis not present

## 2015-06-23 DIAGNOSIS — E785 Hyperlipidemia, unspecified: Secondary | ICD-10-CM | POA: Diagnosis not present

## 2015-06-23 DIAGNOSIS — E039 Hypothyroidism, unspecified: Secondary | ICD-10-CM | POA: Diagnosis not present

## 2015-06-23 DIAGNOSIS — I471 Supraventricular tachycardia: Secondary | ICD-10-CM | POA: Diagnosis not present

## 2015-06-23 DIAGNOSIS — F339 Major depressive disorder, recurrent, unspecified: Secondary | ICD-10-CM | POA: Diagnosis not present

## 2015-06-23 DIAGNOSIS — Z7901 Long term (current) use of anticoagulants: Secondary | ICD-10-CM | POA: Diagnosis not present

## 2015-06-23 DIAGNOSIS — R5383 Other fatigue: Secondary | ICD-10-CM | POA: Diagnosis not present

## 2015-06-23 DIAGNOSIS — Z79899 Other long term (current) drug therapy: Secondary | ICD-10-CM | POA: Diagnosis not present

## 2015-06-23 DIAGNOSIS — I481 Persistent atrial fibrillation: Secondary | ICD-10-CM | POA: Diagnosis not present

## 2015-07-07 DIAGNOSIS — I480 Paroxysmal atrial fibrillation: Secondary | ICD-10-CM | POA: Diagnosis not present

## 2015-07-13 DIAGNOSIS — I480 Paroxysmal atrial fibrillation: Secondary | ICD-10-CM | POA: Diagnosis not present

## 2015-07-22 DIAGNOSIS — I480 Paroxysmal atrial fibrillation: Secondary | ICD-10-CM | POA: Diagnosis not present

## 2015-08-06 DIAGNOSIS — I480 Paroxysmal atrial fibrillation: Secondary | ICD-10-CM | POA: Diagnosis not present

## 2015-08-11 DIAGNOSIS — J069 Acute upper respiratory infection, unspecified: Secondary | ICD-10-CM | POA: Diagnosis not present

## 2015-09-16 DIAGNOSIS — N3001 Acute cystitis with hematuria: Secondary | ICD-10-CM | POA: Diagnosis not present

## 2015-09-16 DIAGNOSIS — N3090 Cystitis, unspecified without hematuria: Secondary | ICD-10-CM | POA: Diagnosis not present

## 2015-10-12 DIAGNOSIS — B079 Viral wart, unspecified: Secondary | ICD-10-CM | POA: Diagnosis not present

## 2015-10-12 DIAGNOSIS — L821 Other seborrheic keratosis: Secondary | ICD-10-CM | POA: Diagnosis not present

## 2015-10-12 DIAGNOSIS — L853 Xerosis cutis: Secondary | ICD-10-CM | POA: Diagnosis not present

## 2015-11-12 DIAGNOSIS — Z23 Encounter for immunization: Secondary | ICD-10-CM | POA: Diagnosis not present

## 2015-11-16 DIAGNOSIS — I480 Paroxysmal atrial fibrillation: Secondary | ICD-10-CM | POA: Diagnosis not present

## 2015-11-16 DIAGNOSIS — H3562 Retinal hemorrhage, left eye: Secondary | ICD-10-CM | POA: Diagnosis not present

## 2016-01-05 DIAGNOSIS — I480 Paroxysmal atrial fibrillation: Secondary | ICD-10-CM | POA: Diagnosis not present

## 2016-01-26 DIAGNOSIS — K5904 Chronic idiopathic constipation: Secondary | ICD-10-CM

## 2016-01-26 DIAGNOSIS — K222 Esophageal obstruction: Secondary | ICD-10-CM

## 2016-01-26 DIAGNOSIS — M159 Polyosteoarthritis, unspecified: Secondary | ICD-10-CM

## 2016-01-26 DIAGNOSIS — Z79899 Other long term (current) drug therapy: Secondary | ICD-10-CM | POA: Insufficient documentation

## 2016-01-26 DIAGNOSIS — I482 Chronic atrial fibrillation, unspecified: Secondary | ICD-10-CM

## 2016-01-26 DIAGNOSIS — I471 Supraventricular tachycardia, unspecified: Secondary | ICD-10-CM

## 2016-01-26 DIAGNOSIS — E782 Mixed hyperlipidemia: Secondary | ICD-10-CM

## 2016-01-26 DIAGNOSIS — M8949 Other hypertrophic osteoarthropathy, multiple sites: Secondary | ICD-10-CM

## 2016-01-26 DIAGNOSIS — L508 Other urticaria: Secondary | ICD-10-CM

## 2016-01-26 DIAGNOSIS — I10 Essential (primary) hypertension: Secondary | ICD-10-CM

## 2016-01-26 DIAGNOSIS — M150 Primary generalized (osteo)arthritis: Secondary | ICD-10-CM

## 2016-01-26 DIAGNOSIS — E039 Hypothyroidism, unspecified: Secondary | ICD-10-CM | POA: Diagnosis not present

## 2016-01-26 HISTORY — DX: Chronic idiopathic constipation: K59.04

## 2016-01-26 HISTORY — DX: Other hypertrophic osteoarthropathy, multiple sites: M89.49

## 2016-01-26 HISTORY — DX: Hypercalcemia: E83.52

## 2016-01-26 HISTORY — DX: Polyosteoarthritis, unspecified: M15.9

## 2016-01-26 HISTORY — DX: Chronic atrial fibrillation, unspecified: I48.20

## 2016-01-26 HISTORY — DX: Supraventricular tachycardia: I47.1

## 2016-01-26 HISTORY — DX: Other urticaria: L50.8

## 2016-01-26 HISTORY — DX: Esophageal obstruction: K22.2

## 2016-01-26 HISTORY — DX: Primary generalized (osteo)arthritis: M15.0

## 2016-01-26 HISTORY — DX: Mixed hyperlipidemia: E78.2

## 2016-01-26 HISTORY — DX: Essential (primary) hypertension: I10

## 2016-01-26 HISTORY — DX: Supraventricular tachycardia, unspecified: I47.10

## 2016-01-26 HISTORY — DX: Other long term (current) drug therapy: Z79.899

## 2016-02-02 DIAGNOSIS — R509 Fever, unspecified: Secondary | ICD-10-CM | POA: Diagnosis not present

## 2016-02-02 DIAGNOSIS — K529 Noninfective gastroenteritis and colitis, unspecified: Secondary | ICD-10-CM | POA: Diagnosis not present

## 2016-03-01 DIAGNOSIS — R194 Change in bowel habit: Secondary | ICD-10-CM | POA: Diagnosis not present

## 2016-03-15 DIAGNOSIS — Z7901 Long term (current) use of anticoagulants: Secondary | ICD-10-CM | POA: Diagnosis not present

## 2016-03-15 DIAGNOSIS — Z79899 Other long term (current) drug therapy: Secondary | ICD-10-CM | POA: Diagnosis not present

## 2016-03-15 DIAGNOSIS — I480 Paroxysmal atrial fibrillation: Secondary | ICD-10-CM | POA: Diagnosis not present

## 2016-03-17 DIAGNOSIS — L821 Other seborrheic keratosis: Secondary | ICD-10-CM | POA: Diagnosis not present

## 2016-03-17 DIAGNOSIS — B079 Viral wart, unspecified: Secondary | ICD-10-CM | POA: Diagnosis not present

## 2016-03-17 DIAGNOSIS — L578 Other skin changes due to chronic exposure to nonionizing radiation: Secondary | ICD-10-CM | POA: Diagnosis not present

## 2016-04-14 DIAGNOSIS — Z7901 Long term (current) use of anticoagulants: Secondary | ICD-10-CM | POA: Diagnosis not present

## 2016-04-14 DIAGNOSIS — Z79899 Other long term (current) drug therapy: Secondary | ICD-10-CM | POA: Diagnosis not present

## 2016-04-14 DIAGNOSIS — I482 Chronic atrial fibrillation: Secondary | ICD-10-CM | POA: Diagnosis not present

## 2016-04-14 DIAGNOSIS — Z8679 Personal history of other diseases of the circulatory system: Secondary | ICD-10-CM | POA: Diagnosis not present

## 2016-04-14 DIAGNOSIS — I480 Paroxysmal atrial fibrillation: Secondary | ICD-10-CM | POA: Diagnosis not present

## 2016-05-05 DIAGNOSIS — K219 Gastro-esophageal reflux disease without esophagitis: Secondary | ICD-10-CM | POA: Diagnosis not present

## 2016-05-05 DIAGNOSIS — Z8679 Personal history of other diseases of the circulatory system: Secondary | ICD-10-CM | POA: Diagnosis not present

## 2016-05-05 DIAGNOSIS — Z96652 Presence of left artificial knee joint: Secondary | ICD-10-CM | POA: Diagnosis not present

## 2016-05-05 DIAGNOSIS — E039 Hypothyroidism, unspecified: Secondary | ICD-10-CM | POA: Diagnosis not present

## 2016-05-05 DIAGNOSIS — Z7901 Long term (current) use of anticoagulants: Secondary | ICD-10-CM | POA: Diagnosis not present

## 2016-05-05 DIAGNOSIS — Z79899 Other long term (current) drug therapy: Secondary | ICD-10-CM | POA: Diagnosis not present

## 2016-05-05 DIAGNOSIS — I10 Essential (primary) hypertension: Secondary | ICD-10-CM | POA: Diagnosis not present

## 2016-05-05 DIAGNOSIS — I482 Chronic atrial fibrillation: Secondary | ICD-10-CM | POA: Diagnosis not present

## 2016-05-05 DIAGNOSIS — I480 Paroxysmal atrial fibrillation: Secondary | ICD-10-CM | POA: Diagnosis not present

## 2016-05-06 DIAGNOSIS — I480 Paroxysmal atrial fibrillation: Secondary | ICD-10-CM | POA: Diagnosis present

## 2016-05-06 DIAGNOSIS — Z79899 Other long term (current) drug therapy: Secondary | ICD-10-CM | POA: Diagnosis not present

## 2016-05-06 DIAGNOSIS — K219 Gastro-esophageal reflux disease without esophagitis: Secondary | ICD-10-CM | POA: Diagnosis present

## 2016-05-06 DIAGNOSIS — I481 Persistent atrial fibrillation: Secondary | ICD-10-CM | POA: Diagnosis not present

## 2016-05-06 DIAGNOSIS — Z96652 Presence of left artificial knee joint: Secondary | ICD-10-CM | POA: Diagnosis present

## 2016-05-06 DIAGNOSIS — Z7901 Long term (current) use of anticoagulants: Secondary | ICD-10-CM | POA: Diagnosis not present

## 2016-05-06 DIAGNOSIS — E039 Hypothyroidism, unspecified: Secondary | ICD-10-CM | POA: Diagnosis present

## 2016-05-06 DIAGNOSIS — I340 Nonrheumatic mitral (valve) insufficiency: Secondary | ICD-10-CM | POA: Diagnosis not present

## 2016-05-06 DIAGNOSIS — I10 Essential (primary) hypertension: Secondary | ICD-10-CM | POA: Diagnosis present

## 2016-05-06 DIAGNOSIS — I482 Chronic atrial fibrillation: Secondary | ICD-10-CM | POA: Diagnosis not present

## 2016-05-07 DIAGNOSIS — E039 Hypothyroidism, unspecified: Secondary | ICD-10-CM | POA: Diagnosis not present

## 2016-05-07 DIAGNOSIS — I482 Chronic atrial fibrillation: Secondary | ICD-10-CM | POA: Diagnosis not present

## 2016-05-07 DIAGNOSIS — I10 Essential (primary) hypertension: Secondary | ICD-10-CM | POA: Diagnosis not present

## 2016-05-07 DIAGNOSIS — Z7901 Long term (current) use of anticoagulants: Secondary | ICD-10-CM | POA: Diagnosis not present

## 2016-05-07 DIAGNOSIS — I480 Paroxysmal atrial fibrillation: Secondary | ICD-10-CM | POA: Diagnosis not present

## 2016-05-07 DIAGNOSIS — K219 Gastro-esophageal reflux disease without esophagitis: Secondary | ICD-10-CM | POA: Diagnosis not present

## 2016-05-07 DIAGNOSIS — I340 Nonrheumatic mitral (valve) insufficiency: Secondary | ICD-10-CM | POA: Diagnosis not present

## 2016-05-07 DIAGNOSIS — Z96652 Presence of left artificial knee joint: Secondary | ICD-10-CM | POA: Diagnosis not present

## 2016-05-18 DIAGNOSIS — M791 Myalgia, unspecified site: Secondary | ICD-10-CM

## 2016-05-18 DIAGNOSIS — I482 Chronic atrial fibrillation: Secondary | ICD-10-CM | POA: Diagnosis not present

## 2016-05-18 HISTORY — DX: Myalgia, unspecified site: M79.10

## 2016-06-21 DIAGNOSIS — I4891 Unspecified atrial fibrillation: Secondary | ICD-10-CM | POA: Diagnosis not present

## 2016-06-23 DIAGNOSIS — I480 Paroxysmal atrial fibrillation: Secondary | ICD-10-CM | POA: Diagnosis not present

## 2016-06-23 DIAGNOSIS — I481 Persistent atrial fibrillation: Secondary | ICD-10-CM | POA: Diagnosis not present

## 2016-06-23 DIAGNOSIS — I482 Chronic atrial fibrillation: Secondary | ICD-10-CM | POA: Diagnosis not present

## 2016-06-23 DIAGNOSIS — Z9889 Other specified postprocedural states: Secondary | ICD-10-CM | POA: Diagnosis not present

## 2016-06-23 DIAGNOSIS — R9431 Abnormal electrocardiogram [ECG] [EKG]: Secondary | ICD-10-CM | POA: Diagnosis not present

## 2016-06-23 DIAGNOSIS — Z7901 Long term (current) use of anticoagulants: Secondary | ICD-10-CM | POA: Diagnosis not present

## 2016-07-01 DIAGNOSIS — I4891 Unspecified atrial fibrillation: Secondary | ICD-10-CM | POA: Diagnosis not present

## 2016-07-08 DIAGNOSIS — R001 Bradycardia, unspecified: Secondary | ICD-10-CM | POA: Diagnosis not present

## 2016-07-08 DIAGNOSIS — Z7901 Long term (current) use of anticoagulants: Secondary | ICD-10-CM | POA: Diagnosis not present

## 2016-07-08 DIAGNOSIS — Z79899 Other long term (current) drug therapy: Secondary | ICD-10-CM | POA: Diagnosis not present

## 2016-07-08 DIAGNOSIS — I480 Paroxysmal atrial fibrillation: Secondary | ICD-10-CM | POA: Diagnosis not present

## 2016-07-08 DIAGNOSIS — R9431 Abnormal electrocardiogram [ECG] [EKG]: Secondary | ICD-10-CM | POA: Diagnosis not present

## 2016-07-14 DIAGNOSIS — R5382 Chronic fatigue, unspecified: Secondary | ICD-10-CM | POA: Diagnosis not present

## 2016-07-14 DIAGNOSIS — I481 Persistent atrial fibrillation: Secondary | ICD-10-CM | POA: Diagnosis not present

## 2016-07-25 DIAGNOSIS — E663 Overweight: Secondary | ICD-10-CM | POA: Diagnosis not present

## 2016-07-25 DIAGNOSIS — R5382 Chronic fatigue, unspecified: Secondary | ICD-10-CM | POA: Insufficient documentation

## 2016-07-25 DIAGNOSIS — E782 Mixed hyperlipidemia: Secondary | ICD-10-CM | POA: Diagnosis not present

## 2016-07-25 DIAGNOSIS — E039 Hypothyroidism, unspecified: Secondary | ICD-10-CM | POA: Diagnosis not present

## 2016-07-25 DIAGNOSIS — Z79899 Other long term (current) drug therapy: Secondary | ICD-10-CM | POA: Diagnosis not present

## 2016-07-25 DIAGNOSIS — I4891 Unspecified atrial fibrillation: Secondary | ICD-10-CM | POA: Diagnosis not present

## 2016-07-25 DIAGNOSIS — I471 Supraventricular tachycardia: Secondary | ICD-10-CM | POA: Diagnosis not present

## 2016-07-25 HISTORY — DX: Chronic fatigue, unspecified: R53.82

## 2016-08-02 DIAGNOSIS — L820 Inflamed seborrheic keratosis: Secondary | ICD-10-CM | POA: Diagnosis not present

## 2016-08-02 DIAGNOSIS — K120 Recurrent oral aphthae: Secondary | ICD-10-CM | POA: Diagnosis not present

## 2016-08-09 DIAGNOSIS — Z7901 Long term (current) use of anticoagulants: Secondary | ICD-10-CM

## 2016-08-09 DIAGNOSIS — H04123 Dry eye syndrome of bilateral lacrimal glands: Secondary | ICD-10-CM | POA: Diagnosis not present

## 2016-08-09 DIAGNOSIS — H2513 Age-related nuclear cataract, bilateral: Secondary | ICD-10-CM | POA: Diagnosis not present

## 2016-08-09 DIAGNOSIS — Z8679 Personal history of other diseases of the circulatory system: Secondary | ICD-10-CM | POA: Insufficient documentation

## 2016-08-09 HISTORY — DX: Personal history of other diseases of the circulatory system: Z86.79

## 2016-08-09 HISTORY — DX: Long term (current) use of anticoagulants: Z79.01

## 2016-08-10 DIAGNOSIS — I481 Persistent atrial fibrillation: Secondary | ICD-10-CM | POA: Diagnosis not present

## 2016-08-12 DIAGNOSIS — Z23 Encounter for immunization: Secondary | ICD-10-CM | POA: Diagnosis not present

## 2016-08-19 DIAGNOSIS — I480 Paroxysmal atrial fibrillation: Secondary | ICD-10-CM | POA: Diagnosis not present

## 2016-08-19 DIAGNOSIS — I481 Persistent atrial fibrillation: Secondary | ICD-10-CM | POA: Diagnosis not present

## 2016-08-19 DIAGNOSIS — I10 Essential (primary) hypertension: Secondary | ICD-10-CM | POA: Diagnosis present

## 2016-08-19 DIAGNOSIS — E039 Hypothyroidism, unspecified: Secondary | ICD-10-CM | POA: Diagnosis not present

## 2016-08-19 DIAGNOSIS — Z96652 Presence of left artificial knee joint: Secondary | ICD-10-CM | POA: Diagnosis present

## 2016-08-19 DIAGNOSIS — Z7901 Long term (current) use of anticoagulants: Secondary | ICD-10-CM | POA: Diagnosis not present

## 2016-08-19 DIAGNOSIS — E782 Mixed hyperlipidemia: Secondary | ICD-10-CM | POA: Diagnosis present

## 2016-08-19 DIAGNOSIS — R946 Abnormal results of thyroid function studies: Secondary | ICD-10-CM

## 2016-08-19 HISTORY — DX: Abnormal results of thyroid function studies: R94.6

## 2016-08-22 DIAGNOSIS — N3001 Acute cystitis with hematuria: Secondary | ICD-10-CM | POA: Diagnosis not present

## 2016-08-22 DIAGNOSIS — N3090 Cystitis, unspecified without hematuria: Secondary | ICD-10-CM | POA: Diagnosis not present

## 2016-08-25 DIAGNOSIS — Z9189 Other specified personal risk factors, not elsewhere classified: Secondary | ICD-10-CM | POA: Diagnosis not present

## 2016-08-25 DIAGNOSIS — Z79899 Other long term (current) drug therapy: Secondary | ICD-10-CM | POA: Diagnosis not present

## 2016-08-25 DIAGNOSIS — N390 Urinary tract infection, site not specified: Secondary | ICD-10-CM | POA: Diagnosis not present

## 2016-08-25 DIAGNOSIS — I4891 Unspecified atrial fibrillation: Secondary | ICD-10-CM | POA: Diagnosis not present

## 2016-08-25 DIAGNOSIS — E039 Hypothyroidism, unspecified: Secondary | ICD-10-CM | POA: Diagnosis not present

## 2016-09-01 DIAGNOSIS — Z7901 Long term (current) use of anticoagulants: Secondary | ICD-10-CM | POA: Diagnosis not present

## 2016-09-01 DIAGNOSIS — R5383 Other fatigue: Secondary | ICD-10-CM | POA: Diagnosis not present

## 2016-09-01 DIAGNOSIS — I481 Persistent atrial fibrillation: Secondary | ICD-10-CM | POA: Diagnosis not present

## 2016-09-01 DIAGNOSIS — Z79899 Other long term (current) drug therapy: Secondary | ICD-10-CM | POA: Diagnosis not present

## 2016-09-06 DIAGNOSIS — I481 Persistent atrial fibrillation: Secondary | ICD-10-CM | POA: Diagnosis not present

## 2016-09-08 DIAGNOSIS — E039 Hypothyroidism, unspecified: Secondary | ICD-10-CM | POA: Diagnosis not present

## 2016-09-08 DIAGNOSIS — R8290 Unspecified abnormal findings in urine: Secondary | ICD-10-CM | POA: Diagnosis not present

## 2016-09-08 DIAGNOSIS — N390 Urinary tract infection, site not specified: Secondary | ICD-10-CM | POA: Diagnosis not present

## 2016-09-08 DIAGNOSIS — Z79899 Other long term (current) drug therapy: Secondary | ICD-10-CM | POA: Diagnosis not present

## 2016-10-05 DIAGNOSIS — Z1231 Encounter for screening mammogram for malignant neoplasm of breast: Secondary | ICD-10-CM | POA: Diagnosis not present

## 2016-10-06 ENCOUNTER — Other Ambulatory Visit: Payer: Self-pay | Admitting: Obstetrics & Gynecology

## 2016-10-06 DIAGNOSIS — N390 Urinary tract infection, site not specified: Secondary | ICD-10-CM | POA: Diagnosis not present

## 2016-10-06 DIAGNOSIS — B370 Candidal stomatitis: Secondary | ICD-10-CM | POA: Diagnosis not present

## 2016-10-06 DIAGNOSIS — E039 Hypothyroidism, unspecified: Secondary | ICD-10-CM | POA: Diagnosis not present

## 2016-10-06 DIAGNOSIS — B373 Candidiasis of vulva and vagina: Secondary | ICD-10-CM | POA: Diagnosis not present

## 2016-10-06 DIAGNOSIS — L304 Erythema intertrigo: Secondary | ICD-10-CM | POA: Diagnosis not present

## 2016-10-06 DIAGNOSIS — R928 Other abnormal and inconclusive findings on diagnostic imaging of breast: Secondary | ICD-10-CM

## 2016-10-07 DIAGNOSIS — Z6826 Body mass index (BMI) 26.0-26.9, adult: Secondary | ICD-10-CM | POA: Diagnosis not present

## 2016-10-07 DIAGNOSIS — Z124 Encounter for screening for malignant neoplasm of cervix: Secondary | ICD-10-CM | POA: Diagnosis not present

## 2016-10-14 ENCOUNTER — Ambulatory Visit
Admission: RE | Admit: 2016-10-14 | Discharge: 2016-10-14 | Disposition: A | Discharge status: Home / Self Care | Payer: Medicare Other | Source: Ambulatory Visit | Attending: Obstetrics & Gynecology | Admitting: Obstetrics & Gynecology

## 2016-10-14 DIAGNOSIS — R922 Inconclusive mammogram: Secondary | ICD-10-CM | POA: Diagnosis not present

## 2016-10-14 DIAGNOSIS — R928 Other abnormal and inconclusive findings on diagnostic imaging of breast: Secondary | ICD-10-CM

## 2016-10-14 DIAGNOSIS — N6321 Unspecified lump in the left breast, upper outer quadrant: Secondary | ICD-10-CM | POA: Diagnosis not present

## 2016-10-16 DIAGNOSIS — G4733 Obstructive sleep apnea (adult) (pediatric): Secondary | ICD-10-CM | POA: Diagnosis not present

## 2016-11-24 DIAGNOSIS — I481 Persistent atrial fibrillation: Secondary | ICD-10-CM | POA: Diagnosis not present

## 2016-11-24 DIAGNOSIS — Z79899 Other long term (current) drug therapy: Secondary | ICD-10-CM | POA: Diagnosis not present

## 2016-11-24 DIAGNOSIS — Z5181 Encounter for therapeutic drug level monitoring: Secondary | ICD-10-CM | POA: Diagnosis not present

## 2016-11-24 DIAGNOSIS — Z7901 Long term (current) use of anticoagulants: Secondary | ICD-10-CM | POA: Diagnosis not present

## 2016-12-20 DIAGNOSIS — Z136 Encounter for screening for cardiovascular disorders: Secondary | ICD-10-CM | POA: Diagnosis not present

## 2016-12-20 DIAGNOSIS — I10 Essential (primary) hypertension: Secondary | ICD-10-CM | POA: Diagnosis not present

## 2016-12-20 DIAGNOSIS — E782 Mixed hyperlipidemia: Secondary | ICD-10-CM | POA: Diagnosis not present

## 2016-12-20 DIAGNOSIS — E039 Hypothyroidism, unspecified: Secondary | ICD-10-CM | POA: Diagnosis not present

## 2016-12-20 DIAGNOSIS — Z0000 Encounter for general adult medical examination without abnormal findings: Secondary | ICD-10-CM | POA: Diagnosis not present

## 2017-01-23 DIAGNOSIS — Z1211 Encounter for screening for malignant neoplasm of colon: Secondary | ICD-10-CM | POA: Diagnosis not present

## 2017-01-30 DIAGNOSIS — E782 Mixed hyperlipidemia: Secondary | ICD-10-CM | POA: Diagnosis not present

## 2017-01-30 DIAGNOSIS — Z79899 Other long term (current) drug therapy: Secondary | ICD-10-CM | POA: Diagnosis not present

## 2017-01-30 DIAGNOSIS — R42 Dizziness and giddiness: Secondary | ICD-10-CM | POA: Diagnosis not present

## 2017-01-30 DIAGNOSIS — F419 Anxiety disorder, unspecified: Secondary | ICD-10-CM | POA: Diagnosis not present

## 2017-01-30 DIAGNOSIS — I4891 Unspecified atrial fibrillation: Secondary | ICD-10-CM | POA: Diagnosis not present

## 2017-01-30 DIAGNOSIS — Z87898 Personal history of other specified conditions: Secondary | ICD-10-CM | POA: Diagnosis not present

## 2017-01-30 DIAGNOSIS — I471 Supraventricular tachycardia: Secondary | ICD-10-CM | POA: Diagnosis not present

## 2017-01-30 DIAGNOSIS — E039 Hypothyroidism, unspecified: Secondary | ICD-10-CM | POA: Diagnosis not present

## 2017-03-02 DIAGNOSIS — Z8679 Personal history of other diseases of the circulatory system: Secondary | ICD-10-CM | POA: Diagnosis not present

## 2017-03-02 DIAGNOSIS — I481 Persistent atrial fibrillation: Secondary | ICD-10-CM | POA: Diagnosis not present

## 2017-03-02 DIAGNOSIS — Z79899 Other long term (current) drug therapy: Secondary | ICD-10-CM | POA: Diagnosis not present

## 2017-03-02 DIAGNOSIS — Z9889 Other specified postprocedural states: Secondary | ICD-10-CM | POA: Diagnosis not present

## 2017-03-07 DIAGNOSIS — M25562 Pain in left knee: Secondary | ICD-10-CM | POA: Diagnosis not present

## 2017-03-07 DIAGNOSIS — M25462 Effusion, left knee: Secondary | ICD-10-CM | POA: Diagnosis not present

## 2017-03-07 DIAGNOSIS — Z96652 Presence of left artificial knee joint: Secondary | ICD-10-CM | POA: Diagnosis not present

## 2017-03-07 DIAGNOSIS — Z4789 Encounter for other orthopedic aftercare: Secondary | ICD-10-CM | POA: Diagnosis not present

## 2017-03-15 DIAGNOSIS — M71571 Other bursitis, not elsewhere classified, right ankle and foot: Secondary | ICD-10-CM | POA: Diagnosis not present

## 2017-03-15 DIAGNOSIS — M2041 Other hammer toe(s) (acquired), right foot: Secondary | ICD-10-CM | POA: Diagnosis not present

## 2017-03-21 DIAGNOSIS — M2041 Other hammer toe(s) (acquired), right foot: Secondary | ICD-10-CM | POA: Diagnosis not present

## 2017-04-11 DIAGNOSIS — I481 Persistent atrial fibrillation: Secondary | ICD-10-CM | POA: Diagnosis not present

## 2017-04-14 DIAGNOSIS — I480 Paroxysmal atrial fibrillation: Secondary | ICD-10-CM | POA: Diagnosis not present

## 2017-04-24 DIAGNOSIS — I481 Persistent atrial fibrillation: Secondary | ICD-10-CM | POA: Diagnosis not present

## 2017-04-27 DIAGNOSIS — H04123 Dry eye syndrome of bilateral lacrimal glands: Secondary | ICD-10-CM | POA: Diagnosis not present

## 2017-06-08 DIAGNOSIS — Z7901 Long term (current) use of anticoagulants: Secondary | ICD-10-CM | POA: Diagnosis not present

## 2017-06-08 DIAGNOSIS — Z5181 Encounter for therapeutic drug level monitoring: Secondary | ICD-10-CM | POA: Diagnosis not present

## 2017-06-08 DIAGNOSIS — I481 Persistent atrial fibrillation: Secondary | ICD-10-CM | POA: Diagnosis not present

## 2017-06-08 DIAGNOSIS — Z79899 Other long term (current) drug therapy: Secondary | ICD-10-CM | POA: Diagnosis not present

## 2017-07-17 DIAGNOSIS — Z800 Family history of malignant neoplasm of digestive organs: Secondary | ICD-10-CM

## 2017-07-17 DIAGNOSIS — K219 Gastro-esophageal reflux disease without esophagitis: Secondary | ICD-10-CM

## 2017-07-17 DIAGNOSIS — Z7901 Long term (current) use of anticoagulants: Secondary | ICD-10-CM | POA: Diagnosis not present

## 2017-07-17 DIAGNOSIS — Z86010 Personal history of colonic polyps: Secondary | ICD-10-CM

## 2017-07-17 DIAGNOSIS — Z860101 Personal history of adenomatous and serrated colon polyps: Secondary | ICD-10-CM

## 2017-07-17 HISTORY — DX: Personal history of adenomatous and serrated colon polyps: Z86.0101

## 2017-07-17 HISTORY — DX: Personal history of colonic polyps: Z86.010

## 2017-07-17 HISTORY — DX: Family history of malignant neoplasm of digestive organs: Z80.0

## 2017-07-17 HISTORY — DX: Gastro-esophageal reflux disease without esophagitis: K21.9

## 2017-07-21 DIAGNOSIS — J020 Streptococcal pharyngitis: Secondary | ICD-10-CM | POA: Diagnosis not present

## 2017-08-01 DIAGNOSIS — E663 Overweight: Secondary | ICD-10-CM | POA: Diagnosis not present

## 2017-08-01 DIAGNOSIS — Z79899 Other long term (current) drug therapy: Secondary | ICD-10-CM | POA: Diagnosis not present

## 2017-08-01 DIAGNOSIS — R300 Dysuria: Secondary | ICD-10-CM | POA: Diagnosis not present

## 2017-08-01 DIAGNOSIS — I471 Supraventricular tachycardia: Secondary | ICD-10-CM | POA: Diagnosis not present

## 2017-08-01 DIAGNOSIS — K210 Gastro-esophageal reflux disease with esophagitis: Secondary | ICD-10-CM | POA: Diagnosis not present

## 2017-08-01 DIAGNOSIS — E782 Mixed hyperlipidemia: Secondary | ICD-10-CM | POA: Diagnosis not present

## 2017-08-01 DIAGNOSIS — Z23 Encounter for immunization: Secondary | ICD-10-CM | POA: Diagnosis not present

## 2017-08-01 DIAGNOSIS — I4891 Unspecified atrial fibrillation: Secondary | ICD-10-CM | POA: Diagnosis not present

## 2017-08-01 DIAGNOSIS — E039 Hypothyroidism, unspecified: Secondary | ICD-10-CM | POA: Diagnosis not present

## 2017-08-01 DIAGNOSIS — F419 Anxiety disorder, unspecified: Secondary | ICD-10-CM | POA: Diagnosis not present

## 2017-08-10 DIAGNOSIS — H04123 Dry eye syndrome of bilateral lacrimal glands: Secondary | ICD-10-CM | POA: Diagnosis not present

## 2017-08-10 DIAGNOSIS — H52223 Regular astigmatism, bilateral: Secondary | ICD-10-CM | POA: Diagnosis not present

## 2017-08-10 DIAGNOSIS — H2513 Age-related nuclear cataract, bilateral: Secondary | ICD-10-CM | POA: Diagnosis not present

## 2017-08-15 DIAGNOSIS — L814 Other melanin hyperpigmentation: Secondary | ICD-10-CM | POA: Diagnosis not present

## 2017-08-15 DIAGNOSIS — L728 Other follicular cysts of the skin and subcutaneous tissue: Secondary | ICD-10-CM | POA: Diagnosis not present

## 2017-08-22 DIAGNOSIS — E663 Overweight: Secondary | ICD-10-CM | POA: Diagnosis not present

## 2017-08-22 DIAGNOSIS — B9620 Unspecified Escherichia coli [E. coli] as the cause of diseases classified elsewhere: Secondary | ICD-10-CM | POA: Diagnosis not present

## 2017-08-22 DIAGNOSIS — N390 Urinary tract infection, site not specified: Secondary | ICD-10-CM | POA: Diagnosis not present

## 2017-08-30 DIAGNOSIS — H2511 Age-related nuclear cataract, right eye: Secondary | ICD-10-CM | POA: Diagnosis not present

## 2017-08-30 DIAGNOSIS — H25811 Combined forms of age-related cataract, right eye: Secondary | ICD-10-CM | POA: Diagnosis not present

## 2017-09-06 DIAGNOSIS — E039 Hypothyroidism, unspecified: Secondary | ICD-10-CM | POA: Diagnosis not present

## 2017-09-14 DIAGNOSIS — I481 Persistent atrial fibrillation: Secondary | ICD-10-CM | POA: Diagnosis not present

## 2017-09-14 DIAGNOSIS — Z5181 Encounter for therapeutic drug level monitoring: Secondary | ICD-10-CM | POA: Diagnosis not present

## 2017-09-14 DIAGNOSIS — Z79899 Other long term (current) drug therapy: Secondary | ICD-10-CM | POA: Diagnosis not present

## 2017-09-18 DIAGNOSIS — H25812 Combined forms of age-related cataract, left eye: Secondary | ICD-10-CM | POA: Diagnosis not present

## 2017-09-18 DIAGNOSIS — H04123 Dry eye syndrome of bilateral lacrimal glands: Secondary | ICD-10-CM | POA: Diagnosis not present

## 2017-09-18 DIAGNOSIS — H2512 Age-related nuclear cataract, left eye: Secondary | ICD-10-CM | POA: Diagnosis not present

## 2017-09-28 DIAGNOSIS — E039 Hypothyroidism, unspecified: Secondary | ICD-10-CM | POA: Diagnosis not present

## 2017-09-28 DIAGNOSIS — J22 Unspecified acute lower respiratory infection: Secondary | ICD-10-CM | POA: Diagnosis not present

## 2017-09-30 DIAGNOSIS — J069 Acute upper respiratory infection, unspecified: Secondary | ICD-10-CM | POA: Diagnosis not present

## 2017-10-26 DIAGNOSIS — E039 Hypothyroidism, unspecified: Secondary | ICD-10-CM | POA: Diagnosis not present

## 2017-11-08 DIAGNOSIS — R3915 Urgency of urination: Secondary | ICD-10-CM | POA: Diagnosis not present

## 2017-11-08 DIAGNOSIS — N3941 Urge incontinence: Secondary | ICD-10-CM | POA: Diagnosis not present

## 2017-11-08 DIAGNOSIS — R350 Frequency of micturition: Secondary | ICD-10-CM | POA: Diagnosis not present

## 2017-11-08 DIAGNOSIS — R351 Nocturia: Secondary | ICD-10-CM | POA: Diagnosis not present

## 2017-11-14 DIAGNOSIS — N959 Unspecified menopausal and perimenopausal disorder: Secondary | ICD-10-CM | POA: Diagnosis not present

## 2017-11-14 DIAGNOSIS — Z1231 Encounter for screening mammogram for malignant neoplasm of breast: Secondary | ICD-10-CM | POA: Diagnosis not present

## 2017-11-14 DIAGNOSIS — Z6826 Body mass index (BMI) 26.0-26.9, adult: Secondary | ICD-10-CM | POA: Diagnosis not present

## 2017-11-24 ENCOUNTER — Encounter: Payer: Self-pay | Admitting: Endocrinology

## 2017-11-24 ENCOUNTER — Ambulatory Visit (INDEPENDENT_AMBULATORY_CARE_PROVIDER_SITE_OTHER): Payer: Medicare Other | Admitting: Endocrinology

## 2017-11-24 VITALS — BP 104/70 | HR 90 | Ht 66.0 in | Wt 165.0 lb

## 2017-11-24 DIAGNOSIS — E559 Vitamin D deficiency, unspecified: Secondary | ICD-10-CM | POA: Diagnosis not present

## 2017-11-24 DIAGNOSIS — E210 Primary hyperparathyroidism: Secondary | ICD-10-CM | POA: Diagnosis not present

## 2017-11-24 DIAGNOSIS — E890 Postprocedural hypothyroidism: Secondary | ICD-10-CM | POA: Diagnosis not present

## 2017-11-24 HISTORY — DX: Postprocedural hypothyroidism: E89.0

## 2017-11-24 LAB — COMPREHENSIVE METABOLIC PANEL
ALK PHOS: 75 U/L (ref 39–117)
ALT: 14 U/L (ref 0–35)
AST: 18 U/L (ref 0–37)
Albumin: 4.2 g/dL (ref 3.5–5.2)
BILIRUBIN TOTAL: 0.5 mg/dL (ref 0.2–1.2)
BUN: 18 mg/dL (ref 6–23)
CO2: 30 mEq/L (ref 19–32)
CREATININE: 1 mg/dL (ref 0.40–1.20)
Calcium: 10.1 mg/dL (ref 8.4–10.5)
Chloride: 100 mEq/L (ref 96–112)
GFR: 57.66 mL/min — ABNORMAL LOW (ref >60.00)
GLUCOSE: 121 mg/dL — AB (ref 70–99)
Potassium: 4.5 mEq/L (ref 3.5–5.1)
SODIUM: 137 meq/L (ref 135–145)
TOTAL PROTEIN: 7 g/dL (ref 6.0–8.3)

## 2017-11-24 LAB — T4, FREE: Free T4: 1.23 ng/dL (ref 0.60–1.60)

## 2017-11-24 LAB — TSH: TSH: 0.93 u[IU]/mL (ref 0.35–4.50)

## 2017-11-24 LAB — VITAMIN D 25 HYDROXY (VIT D DEFICIENCY, FRACTURES): VITD: 15.3 ng/mL — ABNORMAL LOW (ref 30.00–100.00)

## 2017-11-24 NOTE — Progress Notes (Signed)
Patient ID: April Padilla, female   DOB: 1946/06/09, 72 y.o.   MRN: [id number]            Referring Physician: Quintella Reichert  Reason for Appointment:  Hypothyroidism, new visit    History of Present Illness:   Hypothyroidism was first diagnosed in the 1990s after treatment of hyperthyroidism with radioactive iodine Details are not available  She has been followed by various physicians with particularly by her primary care providers She thinks that for several years she had been stable on 88 g of levothyroxine  In 07/2017 she had a routine TSH test and this was found to be high at 7.9 PCP increased her dose to 100 g Currently she is taking 112 g but she does not know when this was started and not clear if she had a TSH done in November also She does not think she was having any symptoms of unusual fatigue or cold intolerance with the 88 g dose previously  Also does not know if she has any change in her physical symptoms or energy level with increasing her dose recently  She is quite regular with taking her levothyroxine around 6:30 every morning and may not need for a couple of hours after that  She is wanting to be seen by a specialist to help her with medication adjustment         Patient's weight history is as follows:  Wt Readings from Last 3 Encounters:  11/24/17 165 lb (74.8 kg)  01/17/12 164 lb 7.4 oz (74.6 kg)  01/06/12 164 lb 7.4 oz (74.6 kg)    Thyroid function results have been as follows:  No results found for: TSH, FREET4, T3FREE   Past Medical History:  Diagnosis Date  . Dysrhythmia    on toprol for fast heart beat  . H/O hiatal hernia   . Hypothyroidism    pt. states she took radioactive pill and has no thyroid  . Shortness of breath    with exertion    Past Surgical History:  Procedure Laterality Date  . BUNIONECTOMY  yrs. ago   left foot  . FOOT SURGERY  2009   took knot out  and put in screws  . KNEE ARTHROSCOPY W/ MENISCAL REPAIR   2009   left  . TOTAL KNEE ARTHROPLASTY  01/16/2012   Procedure: TOTAL KNEE ARTHROPLASTY;  Surgeon: Rudean Haskell, MD;  Location: [place];  Service: Orthopedics;  Laterality: Left;    Family History  Problem Relation Age of Onset  . Anesthesia problems Neg Hx   . Hypotension Neg Hx   . Malignant hyperthermia Neg Hx   . Pseudochol deficiency Neg Hx     Social History:  reports that  has never smoked. she has never used smokeless tobacco. She reports that she does not drink alcohol or use drugs.  Allergies:  Allergies  Allergen Reactions  . Clindamycin/Lincomycin Rash  . Nitrofurantoin Monohyd Macro Rash  . Silver Sulfadiazine Rash    Allergies as of 11/24/2017      Reactions   Clindamycin/lincomycin Rash   Nitrofurantoin Monohyd Macro Rash   Silver Sulfadiazine Rash      Medication List        Accurate as of 11/24/17  1:33 PM. Always use your most recent med list.          ALPRAZolam 0.5 MG tablet Commonly known as:  XANAX alprazolam 0.5 mg tablet   DILTIAZEM CD 120 MG 24 hr capsule Generic drug:  diltiazem diltiazem CD 120 mg capsule,extended release 24 hr   dofetilide 250 MCG capsule Commonly known as:  TIKOSYN dofetilide 250 mcg capsule   ELIQUIS 5 MG Tabs tablet Generic drug:  apixaban Eliquis 5 mg tablet   furosemide 20 MG tablet Commonly known as:  LASIX Take 20 mg by mouth.   levothyroxine 112 MCG tablet Commonly known as:  SYNTHROID, LEVOTHROID Take 112 mcg by mouth daily.   magnesium oxide 400 MG tablet Commonly known as:  MAG-OX Take 400 mg by mouth daily.   ranitidine 150 MG tablet Commonly known as:  ZANTAC Take by mouth.   rosuvastatin 5 MG tablet Commonly known as:  CRESTOR rosuvastatin 5 mg tablet          Review of Systems  Constitutional: Negative for weight loss and weight gain.  HENT:       Occasionally has mild difficulty swallowing  Respiratory: Negative for shortness of breath.   Cardiovascular: Positive for  palpitations.       Occasionally may have leg swelling, rarely takes Lasix for this.  She monitors her pulse on her wrist, sometimes has a faster rate with atrial fibrillation  Gastrointestinal: Negative for abdominal pain.       Reflux present, long-standing, takes ranitidine bid  Endocrine: Positive for cold intolerance and heat intolerance. Negative for decreased concentration.       Feels relatively cold and winter and excessively warm in summer.  Musculoskeletal: Negative for joint pain.  Skin: Positive for dry skin.  Neurological:       Has had some memory issues, slightly worse now  Psychiatric/Behavioral: Negative for nervousness.       Occasionally may have difficulty sleeping                Examination:    BP 104/70   Pulse 90   Ht 5\' 6"  (1.676 m)   Wt 165 lb (74.8 kg)   SpO2 97%   BMI 26.63 kg/m   GENERAL:  Average build.  She looks well  No pallor, clubbing, cervical lymphadenopathy or edema.    Skin:  no rash or pigmentation.  EYES:  No prominence of the eyes or swelling of the eyelids  ENT: Oral mucosa and tongue normal.  THYROID:  Not palpable.  HEART:  Normal  S1 and S2; no murmur or click.  CHEST:    Lungs: Vescicular breath sounds heard equally.  No crepitations/ wheeze.  ABDOMEN:  No distention.  Liver and spleen not palpable.   No other mass or tenderness.  NEUROLOGICAL: Affect appears normal Reflexes are bilaterally normal at biceps.  JOINTS:  Normal peripheral joints. Spine shows no deformity   Assessment:  HYPOTHYROIDISM, secondary to I-131 ablation of thyroid for hyperthyroidism  She had been on 88 g of levothyroxine for some time and more recently on 696 for uncertain duration of time  Currently with the 112 g dose her TSH in December was normal but not clear when the dosage was changed prior to that She is not able to tell subjectively she has symptoms of hypothyroidism and her TSH is high and not able to perceive any changes  when her dosages are adjusted  HYPERCALCEMIA: Reportedly has had mild long-standing hypercalcemia with no No recent PTH supports available Also has not had evaluation for osteoporosis  Mild memory loss: Requested patient to discuss this with her PCP  Atrial fibrillation money by cardiologist and continues to be on anticoagulants  PLAN:   Recheck thyroid levels and  decide on dose of levothyroxine If she has significant variability in her levels may consider a brand name instead of generic  Check PTH, vitamin D and repeat calcium today  She will also request her PCP to schedule bone density to rule out osteoporosis related to menopause and probable hyperparathyroidism Implications of hyperparathyroidism discussed  Follow-up to be decided   Elayne Snare 11/24/2017, 1:33 PM   Consultation note copy sent to the PCP  Note: This office note was prepared with Dragon voice recognition system technology. Any transcriptional errors that result from this process are unintentional.  ADDENDUM: Labs as follows:  Calcium is upper normal with high PTH and low vitamin D confirming hyperparathyroidism She should start OTC vitamin D3, 2000 units daily TSH 0.93 and she will continue the same doses of levothyroxine  Office Visit on 11/24/2017  Component Date Value Ref Range Status  . Sodium 11/24/2017 137  135 - 145 mEq/L Final  . Potassium 11/24/2017 4.5  3.5 - 5.1 mEq/L Final  . Chloride 11/24/2017 100  96 - 112 mEq/L Final  . CO2 11/24/2017 30  19 - 32 mEq/L Final  . Glucose, Bld 11/24/2017 121* 70 - 99 mg/dL Final  . BUN 11/24/2017 18  6 - 23 mg/dL Final  . Creatinine, Ser 11/24/2017 1.00  0.40 - 1.20 mg/dL Final  . Total Bilirubin 11/24/2017 0.5  0.2 - 1.2 mg/dL Final  . Alkaline Phosphatase 11/24/2017 75  39 - 117 U/L Final  . AST 11/24/2017 18  0 - 37 U/L Final  . ALT 11/24/2017 14  0 - 35 U/L Final  . Total Protein 11/24/2017 7.0  6.0 - 8.3 g/dL Final  . Albumin 11/24/2017 4.2  3.5 -  5.2 g/dL Final  . Calcium 11/24/2017 10.1  8.4 - 10.5 mg/dL Final  . GFR 11/24/2017 57.66* >60.00 mL/min Final  . VITD 11/24/2017 15.30* 30.00 - 100.00 ng/mL Final  . PTH 11/24/2017 76* 15 - 65 pg/mL Final  . Free T4 11/24/2017 1.23  0.60 - 1.60 ng/dL Final   Comment: Specimens from patients who are undergoing biotin therapy and /or ingesting biotin supplements may contain high levels of biotin.  The higher biotin concentration in these specimens interferes with this Free T4 assay.  Specimens that contain high levels  of biotin may cause false high results for this Free T4 assay.  Please interpret results in light of the total clinical presentation of the patient.    Marland Kitchen TSH 11/24/2017 0.93  0.35 - 4.50 uIU/mL Final  . specimen status report 11/24/2017 Comment   Preliminary   NTI Serum Gel Tube

## 2017-11-25 LAB — SPECIMEN STATUS REPORT

## 2017-11-26 DIAGNOSIS — E559 Vitamin D deficiency, unspecified: Secondary | ICD-10-CM | POA: Insufficient documentation

## 2017-11-26 DIAGNOSIS — E210 Primary hyperparathyroidism: Secondary | ICD-10-CM

## 2017-11-26 HISTORY — DX: Primary hyperparathyroidism: E21.0

## 2017-11-26 HISTORY — DX: Vitamin D deficiency, unspecified: E55.9

## 2017-11-27 ENCOUNTER — Telehealth: Payer: Self-pay

## 2017-11-27 LAB — PARATHYROID HORMONE, INTACT (NO CA): PTH: 76 pg/mL — AB (ref 15–65)

## 2017-11-27 NOTE — Telephone Encounter (Signed)
Patient called and requested to schedule her next appointment in her chart it states undecided please advise on when the patient needs to come for f/u so that it can be scheduled

## 2017-11-27 NOTE — Telephone Encounter (Signed)
She can come back in 6 months with labs

## 2017-11-29 ENCOUNTER — Telehealth: Payer: Self-pay | Admitting: Endocrinology

## 2017-11-29 NOTE — Telephone Encounter (Signed)
LM with patients husband requesting patient call back to schedule appointment

## 2017-11-29 NOTE — Telephone Encounter (Signed)
[facility] is calling, need patient last office note,  Fax # [phone]  Dr Marcello Moores office

## 2017-11-30 NOTE — Telephone Encounter (Signed)
Pt is returning call from yesterday.  °

## 2017-12-01 NOTE — Telephone Encounter (Signed)
Last office note faxed to [facility] and patient notified

## 2017-12-10 DIAGNOSIS — N3091 Cystitis, unspecified with hematuria: Secondary | ICD-10-CM | POA: Diagnosis not present

## 2017-12-10 DIAGNOSIS — J069 Acute upper respiratory infection, unspecified: Secondary | ICD-10-CM | POA: Diagnosis not present

## 2017-12-11 DIAGNOSIS — N390 Urinary tract infection, site not specified: Secondary | ICD-10-CM | POA: Diagnosis not present

## 2017-12-14 DIAGNOSIS — I4581 Long QT syndrome: Secondary | ICD-10-CM | POA: Diagnosis not present

## 2017-12-14 DIAGNOSIS — Z5181 Encounter for therapeutic drug level monitoring: Secondary | ICD-10-CM | POA: Diagnosis not present

## 2017-12-14 DIAGNOSIS — Z79899 Other long term (current) drug therapy: Secondary | ICD-10-CM | POA: Diagnosis not present

## 2017-12-14 DIAGNOSIS — I481 Persistent atrial fibrillation: Secondary | ICD-10-CM | POA: Diagnosis not present

## 2017-12-21 DIAGNOSIS — Z1211 Encounter for screening for malignant neoplasm of colon: Secondary | ICD-10-CM | POA: Diagnosis not present

## 2017-12-21 DIAGNOSIS — Z0000 Encounter for general adult medical examination without abnormal findings: Secondary | ICD-10-CM | POA: Diagnosis not present

## 2017-12-21 DIAGNOSIS — E663 Overweight: Secondary | ICD-10-CM | POA: Diagnosis not present

## 2017-12-21 DIAGNOSIS — N959 Unspecified menopausal and perimenopausal disorder: Secondary | ICD-10-CM | POA: Diagnosis not present

## 2017-12-21 DIAGNOSIS — I10 Essential (primary) hypertension: Secondary | ICD-10-CM | POA: Diagnosis not present

## 2017-12-21 DIAGNOSIS — E785 Hyperlipidemia, unspecified: Secondary | ICD-10-CM | POA: Diagnosis not present

## 2017-12-21 DIAGNOSIS — Z9181 History of falling: Secondary | ICD-10-CM | POA: Diagnosis not present

## 2017-12-21 DIAGNOSIS — Z1389 Encounter for screening for other disorder: Secondary | ICD-10-CM | POA: Diagnosis not present

## 2017-12-27 DIAGNOSIS — E782 Mixed hyperlipidemia: Secondary | ICD-10-CM | POA: Diagnosis not present

## 2017-12-27 DIAGNOSIS — F419 Anxiety disorder, unspecified: Secondary | ICD-10-CM | POA: Diagnosis not present

## 2017-12-27 DIAGNOSIS — E039 Hypothyroidism, unspecified: Secondary | ICD-10-CM | POA: Diagnosis not present

## 2017-12-27 DIAGNOSIS — N390 Urinary tract infection, site not specified: Secondary | ICD-10-CM | POA: Diagnosis not present

## 2017-12-27 DIAGNOSIS — I471 Supraventricular tachycardia: Secondary | ICD-10-CM | POA: Diagnosis not present

## 2017-12-27 DIAGNOSIS — K210 Gastro-esophageal reflux disease with esophagitis: Secondary | ICD-10-CM | POA: Diagnosis not present

## 2017-12-27 DIAGNOSIS — I4891 Unspecified atrial fibrillation: Secondary | ICD-10-CM | POA: Diagnosis not present

## 2017-12-27 DIAGNOSIS — Z79899 Other long term (current) drug therapy: Secondary | ICD-10-CM | POA: Diagnosis not present

## 2017-12-29 DIAGNOSIS — Z5181 Encounter for therapeutic drug level monitoring: Secondary | ICD-10-CM | POA: Diagnosis not present

## 2017-12-29 DIAGNOSIS — Z7901 Long term (current) use of anticoagulants: Secondary | ICD-10-CM | POA: Diagnosis not present

## 2017-12-29 DIAGNOSIS — Z79899 Other long term (current) drug therapy: Secondary | ICD-10-CM | POA: Diagnosis not present

## 2017-12-29 DIAGNOSIS — I481 Persistent atrial fibrillation: Secondary | ICD-10-CM | POA: Diagnosis not present

## 2018-01-04 DIAGNOSIS — Z7901 Long term (current) use of anticoagulants: Secondary | ICD-10-CM | POA: Diagnosis not present

## 2018-01-04 DIAGNOSIS — Z79899 Other long term (current) drug therapy: Secondary | ICD-10-CM | POA: Diagnosis not present

## 2018-01-04 DIAGNOSIS — I088 Other rheumatic multiple valve diseases: Secondary | ICD-10-CM | POA: Diagnosis not present

## 2018-01-04 DIAGNOSIS — I361 Nonrheumatic tricuspid (valve) insufficiency: Secondary | ICD-10-CM | POA: Diagnosis not present

## 2018-01-04 DIAGNOSIS — R931 Abnormal findings on diagnostic imaging of heart and coronary circulation: Secondary | ICD-10-CM | POA: Diagnosis not present

## 2018-01-04 DIAGNOSIS — Z5181 Encounter for therapeutic drug level monitoring: Secondary | ICD-10-CM | POA: Diagnosis not present

## 2018-01-04 DIAGNOSIS — I481 Persistent atrial fibrillation: Secondary | ICD-10-CM | POA: Diagnosis not present

## 2018-01-29 DIAGNOSIS — K5900 Constipation, unspecified: Secondary | ICD-10-CM | POA: Diagnosis not present

## 2018-01-29 DIAGNOSIS — R609 Edema, unspecified: Secondary | ICD-10-CM | POA: Diagnosis not present

## 2018-01-29 DIAGNOSIS — Z79899 Other long term (current) drug therapy: Secondary | ICD-10-CM | POA: Insufficient documentation

## 2018-01-29 DIAGNOSIS — I4891 Unspecified atrial fibrillation: Secondary | ICD-10-CM | POA: Diagnosis not present

## 2018-01-29 HISTORY — DX: Other long term (current) drug therapy: Z79.899

## 2018-03-08 DIAGNOSIS — M79671 Pain in right foot: Secondary | ICD-10-CM | POA: Diagnosis not present

## 2018-03-08 DIAGNOSIS — M722 Plantar fascial fibromatosis: Secondary | ICD-10-CM | POA: Diagnosis not present

## 2018-03-08 DIAGNOSIS — L84 Corns and callosities: Secondary | ICD-10-CM | POA: Diagnosis not present

## 2018-03-08 DIAGNOSIS — M79672 Pain in left foot: Secondary | ICD-10-CM | POA: Diagnosis not present

## 2018-03-15 DIAGNOSIS — Z79899 Other long term (current) drug therapy: Secondary | ICD-10-CM | POA: Diagnosis not present

## 2018-03-15 DIAGNOSIS — I481 Persistent atrial fibrillation: Secondary | ICD-10-CM | POA: Diagnosis not present

## 2018-03-15 DIAGNOSIS — Z5181 Encounter for therapeutic drug level monitoring: Secondary | ICD-10-CM | POA: Diagnosis not present

## 2018-04-03 DIAGNOSIS — I10 Essential (primary) hypertension: Secondary | ICD-10-CM | POA: Diagnosis not present

## 2018-04-03 DIAGNOSIS — E039 Hypothyroidism, unspecified: Secondary | ICD-10-CM | POA: Diagnosis not present

## 2018-04-03 DIAGNOSIS — R300 Dysuria: Secondary | ICD-10-CM | POA: Diagnosis not present

## 2018-04-03 DIAGNOSIS — Z0000 Encounter for general adult medical examination without abnormal findings: Secondary | ICD-10-CM | POA: Diagnosis not present

## 2018-04-03 DIAGNOSIS — F5104 Psychophysiologic insomnia: Secondary | ICD-10-CM | POA: Diagnosis not present

## 2018-04-03 DIAGNOSIS — E782 Mixed hyperlipidemia: Secondary | ICD-10-CM | POA: Diagnosis not present

## 2018-05-31 ENCOUNTER — Other Ambulatory Visit (INDEPENDENT_AMBULATORY_CARE_PROVIDER_SITE_OTHER): Payer: Medicare Other

## 2018-05-31 DIAGNOSIS — E890 Postprocedural hypothyroidism: Secondary | ICD-10-CM | POA: Diagnosis not present

## 2018-05-31 DIAGNOSIS — E559 Vitamin D deficiency, unspecified: Secondary | ICD-10-CM

## 2018-05-31 LAB — BASIC METABOLIC PANEL
BUN: 14 mg/dL (ref 6–23)
CALCIUM: 10.2 mg/dL (ref 8.4–10.5)
CHLORIDE: 103 meq/L (ref 96–112)
CO2: 29 mEq/L (ref 19–32)
Creatinine, Ser: 0.98 mg/dL (ref 0.40–1.20)
GFR: 58.94 mL/min — AB (ref >60.00)
Glucose, Bld: 99 mg/dL (ref 70–99)
Potassium: 4.2 mEq/L (ref 3.5–5.1)
Sodium: 139 mEq/L (ref 135–145)

## 2018-05-31 LAB — VITAMIN D 25 HYDROXY (VIT D DEFICIENCY, FRACTURES): VITD: 33.12 ng/mL (ref 30.00–100.00)

## 2018-05-31 LAB — TSH: TSH: 2.36 u[IU]/mL (ref 0.35–4.50)

## 2018-05-31 LAB — T4, FREE: FREE T4: 1.1 ng/dL (ref 0.60–1.60)

## 2018-06-05 ENCOUNTER — Encounter: Payer: Self-pay | Admitting: Endocrinology

## 2018-06-05 ENCOUNTER — Ambulatory Visit (INDEPENDENT_AMBULATORY_CARE_PROVIDER_SITE_OTHER): Payer: Medicare Other | Admitting: Endocrinology

## 2018-06-05 VITALS — BP 128/72 | HR 62 | Ht 64.0 in | Wt 161.8 lb

## 2018-06-05 DIAGNOSIS — E890 Postprocedural hypothyroidism: Secondary | ICD-10-CM | POA: Diagnosis not present

## 2018-06-05 DIAGNOSIS — Z780 Asymptomatic menopausal state: Secondary | ICD-10-CM

## 2018-06-05 DIAGNOSIS — E210 Primary hyperparathyroidism: Secondary | ICD-10-CM | POA: Diagnosis not present

## 2018-06-05 NOTE — Progress Notes (Signed)
Patient ID: April Padilla, female   DOB: June 30, 1946, 72 y.o.   MRN: [id number]            Referring Physician: Quintella Reichert  Reason for Appointment: Endocrinology follow-up visit    History of Present Illness:   Hypothyroidism was first diagnosed in the 1990s after treatment of hyperthyroidism with radioactive iodine Details are not available  She has been followed by various physicians with particularly by her primary care providers She thinks that for several years she had been stable on 88 g of levothyroxine  In 07/2017 she had a routine TSH test and this was found to be high at 7.9 Subsequently her levothyroxine doses have been increased and she has been taking 112 mcg since late 2018  She is quite regular with taking her levothyroxine in the morning and may not eat for a couple of hours after  Thyroid level was normal on her last visit and the dose has been continued unchanged She will occasionally feel tired but not consistently  No hair loss Weight has come down slightly  Her TSH is again normal         Patient's weight history is as follows:  Wt Readings from Last 3 Encounters:  06/05/18 161 lb 12.8 oz (73.4 kg)  11/24/17 165 lb (74.8 kg)  01/17/12 164 lb 7.4 oz (74.6 kg)    Thyroid function results have been as follows:  Lab Results  Component Value Date   TSH 2.36 05/31/2018   TSH 0.93 11/24/2017   FREET4 1.10 05/31/2018   FREET4 1.23 11/24/2017    HYPERCALCEMIA: See review of systems    Past Medical History:  Diagnosis Date  . Dysrhythmia    on toprol for fast heart beat  . H/O hiatal hernia   . Hypothyroidism    pt. states she took radioactive pill and has no thyroid  . Shortness of breath    with exertion    Past Surgical History:  Procedure Laterality Date  . BUNIONECTOMY  yrs. ago   left foot  . FOOT SURGERY  2009   took knot out  and put in screws  . KNEE ARTHROSCOPY W/ MENISCAL REPAIR  2009   left  . TOTAL KNEE ARTHROPLASTY   01/16/2012   Procedure: TOTAL KNEE ARTHROPLASTY;  Surgeon: Rudean Haskell, MD;  Location: [facility];  Service: Orthopedics;  Laterality: Left;    Family History  Problem Relation Age of Onset  . Cancer Mother   . Thyroid disease Mother   . Stroke Father   . Anesthesia problems Neg Hx   . Hypotension Neg Hx   . Malignant hyperthermia Neg Hx   . Pseudochol deficiency Neg Hx     Social History:  reports that she has never smoked. She has never used smokeless tobacco. She reports that she does not drink alcohol or use drugs.  Allergies:  Allergies  Allergen Reactions  . Clindamycin/Lincomycin Rash  . Nitrofurantoin Monohyd Macro Rash  . Silver Sulfadiazine Rash    Allergies as of 06/05/2018      Reactions   Clindamycin/lincomycin Rash   Nitrofurantoin Monohyd Macro Rash   Silver Sulfadiazine Rash      Medication List        Accurate as of 06/05/18  9:12 AM. Always use your most recent med list.          ALPRAZolam 0.5 MG tablet Commonly known as:  XANAX alprazolam 0.5 mg tablet   DILTIAZEM CD 120 MG 24  hr capsule Generic drug:  diltiazem diltiazem CD 120 mg capsule,extended release 24 hr   dofetilide 250 MCG capsule Commonly known as:  TIKOSYN dofetilide 250 mcg capsule   ELIQUIS 5 MG Tabs tablet Generic drug:  apixaban Eliquis 5 mg tablet   furosemide 20 MG tablet Commonly known as:  LASIX Take 20 mg by mouth.   levothyroxine 112 MCG tablet Commonly known as:  SYNTHROID, LEVOTHROID Take 100 mcg by mouth daily.   magnesium oxide 400 MG tablet Commonly known as:  MAG-OX Take 400 mg by mouth daily.   ranitidine 150 MG tablet Commonly known as:  ZANTAC Take by mouth.   rosuvastatin 5 MG tablet Commonly known as:  CRESTOR rosuvastatin 5 mg tablet          Review of Systems  HYPERCALCEMIA: She appears to have had mild hyperparathyroidism with variable increase in calcium levels and on the last measurement and increased PTH level  also  She was  asked to get a bone density done from her PCP but has not done so and is now seeing a new PCP  Lab Results  Component Value Date   PTH 76 (H) 11/24/2017   CALCIUM 10.2 05/31/2018   VITAMIN D deficiency: She was found to have a low vitamin D level of 15 and 1/19 She is taking the OTC vitamin D3, 2000 units daily as recommended  Her levels are back to normal  Lab Results  Component Value Date   VD25OH 33.12 05/31/2018   VD25OH 15.30 (L) 11/24/2017               Examination:    BP 128/72 (BP Location: Left Arm, Patient Position: Sitting, Cuff Size: Normal)   Pulse 62   Ht 5\' 4"  (1.626 m)   Wt 161 lb 12.8 oz (73.4 kg)   SpO2 96%   BMI 27.77 kg/m       Assessment:  HYPOTHYROIDISM, secondary to I-131 ablation of thyroid for hyperthyroidism  She has been supplemented with 112 g of levothyroxine for several months  With this she has had consistently normal levels more recently and subjectively doing fairly well Her TSH is again normal She is compliant with her supplement every morning  HYPERPARATHYROIDISM: She reportedly has had mild hypercalcemia for quite some time and PTH confirmed the diagnosis  Currently asymptomatic but bone density not available  VITAMIN D deficiency: This is now adequately supplemented with 2000 units vitamin D3    PLAN:   She will continue the same dose of levothyroxine  She does need a bone density and this will be scheduled.  Need to assess this because of her age and diagnosis of vitamin D deficiency and hyperparathyroidism.   Follow-up to be decided based on bone density results   Elayne Snare 06/05/2018, 9:12 AM     Note: This office note was prepared with Dragon voice recognition system technology. Any transcriptional errors that result from this process are unintentional.  ADDENDUM: Labs as follows:  Calcium is upper normal with high PTH and low vitamin D confirming hyperparathyroidism She should start OTC vitamin D3, 2000  units daily TSH 0.93 and she will continue the same doses of levothyroxine  Lab on 05/31/2018  Component Date Value Ref Range Status  . VITD 05/31/2018 33.12  30.00 - 100.00 ng/mL Final  . Sodium 05/31/2018 139  135 - 145 mEq/L Final  . Potassium 05/31/2018 4.2  3.5 - 5.1 mEq/L Final  . Chloride 05/31/2018 103  96 - 112  mEq/L Final  . CO2 05/31/2018 29  19 - 32 mEq/L Final  . Glucose, Bld 05/31/2018 99  70 - 99 mg/dL Final  . BUN 05/31/2018 14  6 - 23 mg/dL Final  . Creatinine, Ser 05/31/2018 0.98  0.40 - 1.20 mg/dL Final  . Calcium 05/31/2018 10.2  8.4 - 10.5 mg/dL Final  . GFR 05/31/2018 58.94* >60.00 mL/min Final  . TSH 05/31/2018 2.36  0.35 - 4.50 uIU/mL Final  . Free T4 05/31/2018 1.10  0.60 - 1.60 ng/dL Final   Comment: Specimens from patients who are undergoing biotin therapy and /or ingesting biotin supplements may contain high levels of biotin.  The higher biotin concentration in these specimens interferes with this Free T4 assay.  Specimens that contain high levels  of biotin may cause false high results for this Free T4 assay.  Please interpret results in light of the total clinical presentation of the patient.

## 2018-06-14 ENCOUNTER — Ambulatory Visit (INDEPENDENT_AMBULATORY_CARE_PROVIDER_SITE_OTHER)
Admission: RE | Admit: 2018-06-14 | Discharge: 2018-06-14 | Disposition: A | Discharge status: Home / Self Care | Payer: Medicare Other | Source: Ambulatory Visit | Attending: Endocrinology | Admitting: Endocrinology

## 2018-06-14 DIAGNOSIS — E210 Primary hyperparathyroidism: Secondary | ICD-10-CM | POA: Diagnosis not present

## 2018-06-14 DIAGNOSIS — Z780 Asymptomatic menopausal state: Secondary | ICD-10-CM

## 2018-07-06 DIAGNOSIS — Z7901 Long term (current) use of anticoagulants: Secondary | ICD-10-CM

## 2018-07-06 DIAGNOSIS — Z79899 Other long term (current) drug therapy: Secondary | ICD-10-CM | POA: Diagnosis not present

## 2018-07-06 DIAGNOSIS — E039 Hypothyroidism, unspecified: Secondary | ICD-10-CM | POA: Diagnosis not present

## 2018-07-06 DIAGNOSIS — M25512 Pain in left shoulder: Secondary | ICD-10-CM

## 2018-07-06 DIAGNOSIS — I499 Cardiac arrhythmia, unspecified: Secondary | ICD-10-CM

## 2018-07-06 DIAGNOSIS — I480 Paroxysmal atrial fibrillation: Secondary | ICD-10-CM | POA: Diagnosis not present

## 2018-07-06 DIAGNOSIS — I10 Essential (primary) hypertension: Secondary | ICD-10-CM | POA: Diagnosis not present

## 2018-07-06 DIAGNOSIS — E782 Mixed hyperlipidemia: Secondary | ICD-10-CM | POA: Diagnosis not present

## 2018-07-06 HISTORY — DX: Pain in left shoulder: M25.512

## 2018-07-06 HISTORY — DX: Cardiac arrhythmia, unspecified: I49.9

## 2018-07-06 HISTORY — DX: Long term (current) use of anticoagulants: Z79.01

## 2018-07-26 DIAGNOSIS — I481 Persistent atrial fibrillation: Secondary | ICD-10-CM | POA: Diagnosis not present

## 2018-08-10 DIAGNOSIS — I4819 Other persistent atrial fibrillation: Secondary | ICD-10-CM | POA: Diagnosis not present

## 2018-08-28 DIAGNOSIS — Z800 Family history of malignant neoplasm of digestive organs: Secondary | ICD-10-CM | POA: Diagnosis not present

## 2018-08-28 DIAGNOSIS — K219 Gastro-esophageal reflux disease without esophagitis: Secondary | ICD-10-CM | POA: Diagnosis not present

## 2018-08-28 DIAGNOSIS — R194 Change in bowel habit: Secondary | ICD-10-CM | POA: Diagnosis not present

## 2018-08-28 DIAGNOSIS — Z791 Long term (current) use of non-steroidal anti-inflammatories (NSAID): Secondary | ICD-10-CM | POA: Diagnosis not present

## 2018-08-30 IMAGING — MG 2D DIGITAL DIAGNOSTIC UNILATERAL LEFT MAMMOGRAM WITH CAD AND ADJ
6 series · 6 of 14 positions shown · non-contrast
Comparison: Previous exam(s).

CLINICAL DATA: Screening recall for mass seen in the upper-outer
posterior left breast.

EXAM:
2D DIGITAL DIAGNOSTIC UNILATERAL LEFT MAMMOGRAM WITH CAD AND ADJUNCT
TOMO

[L CC]
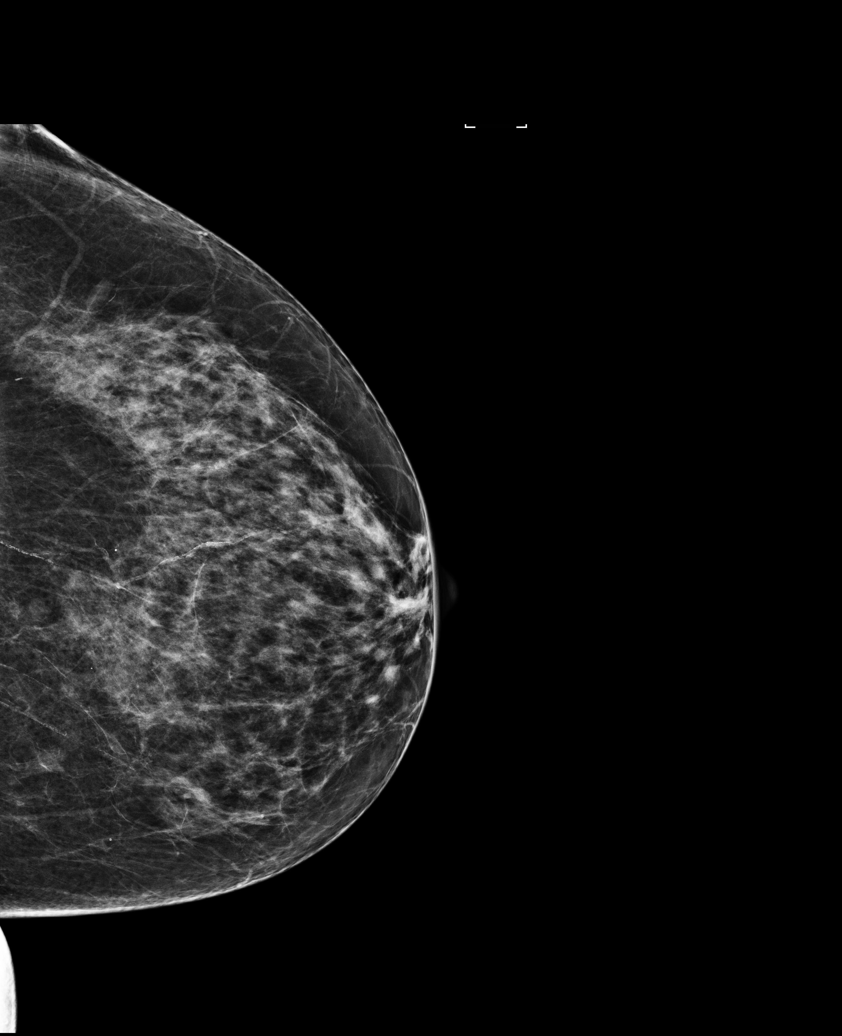

[L MLO]
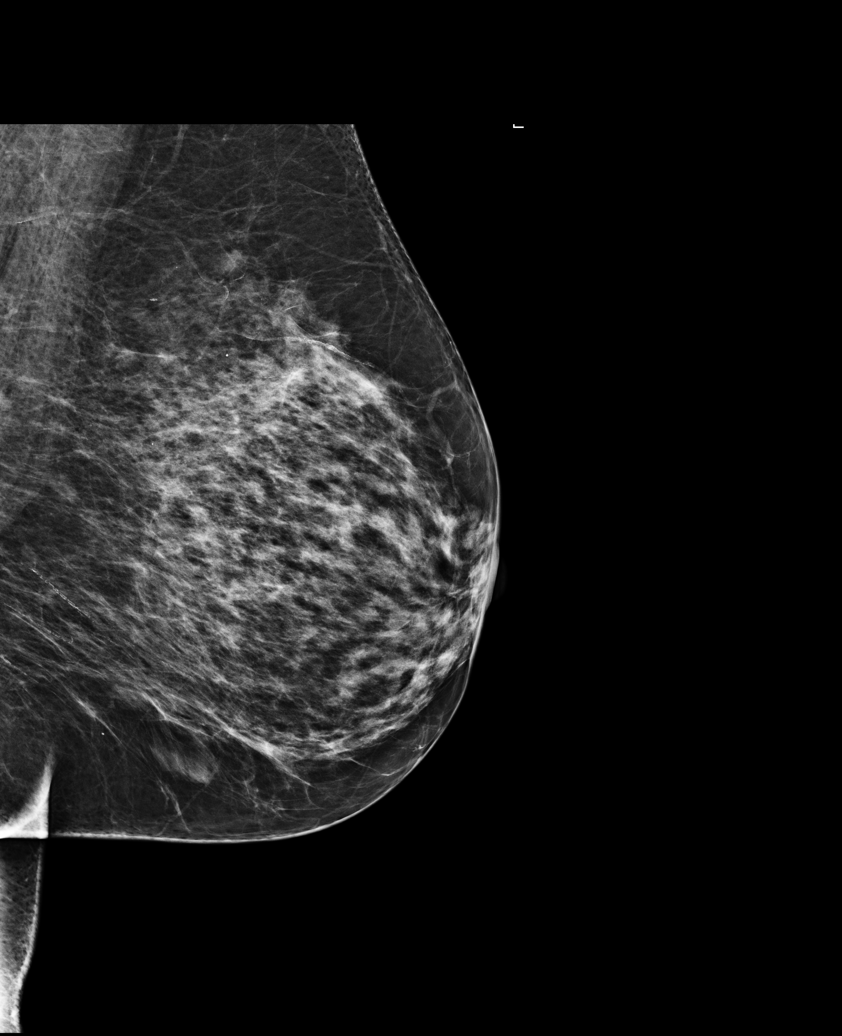

[L CC synth-2D]
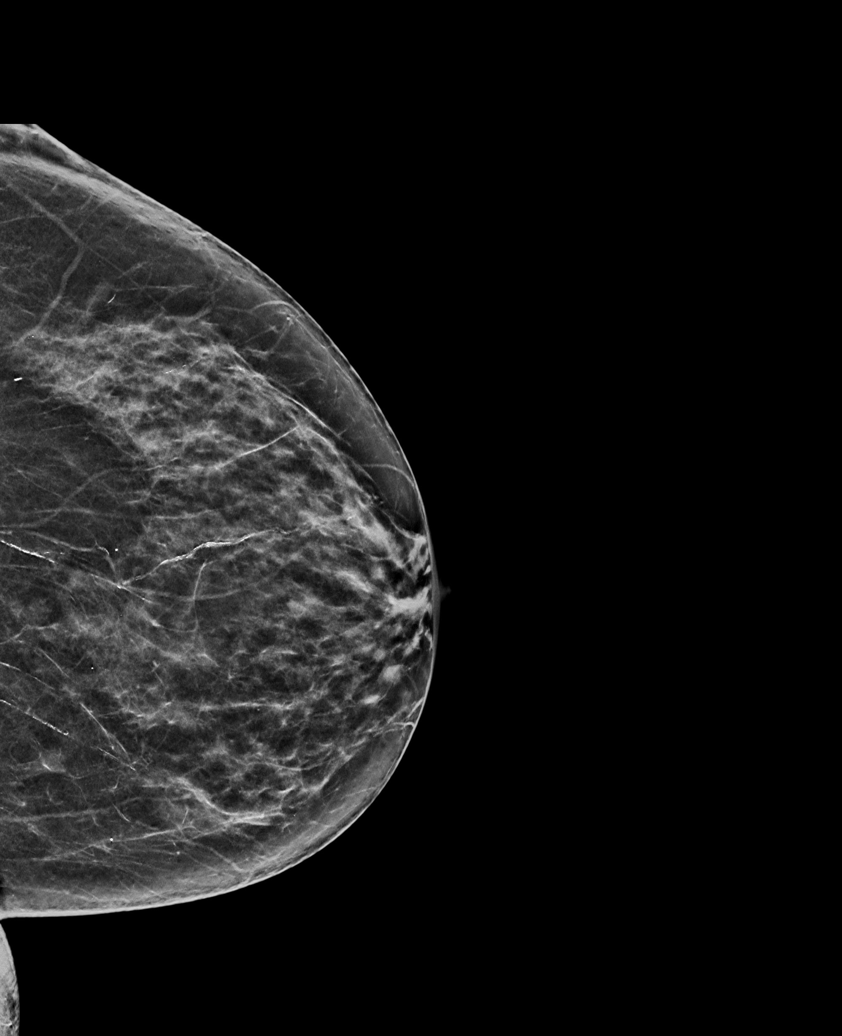

[L MLO synth-2D]
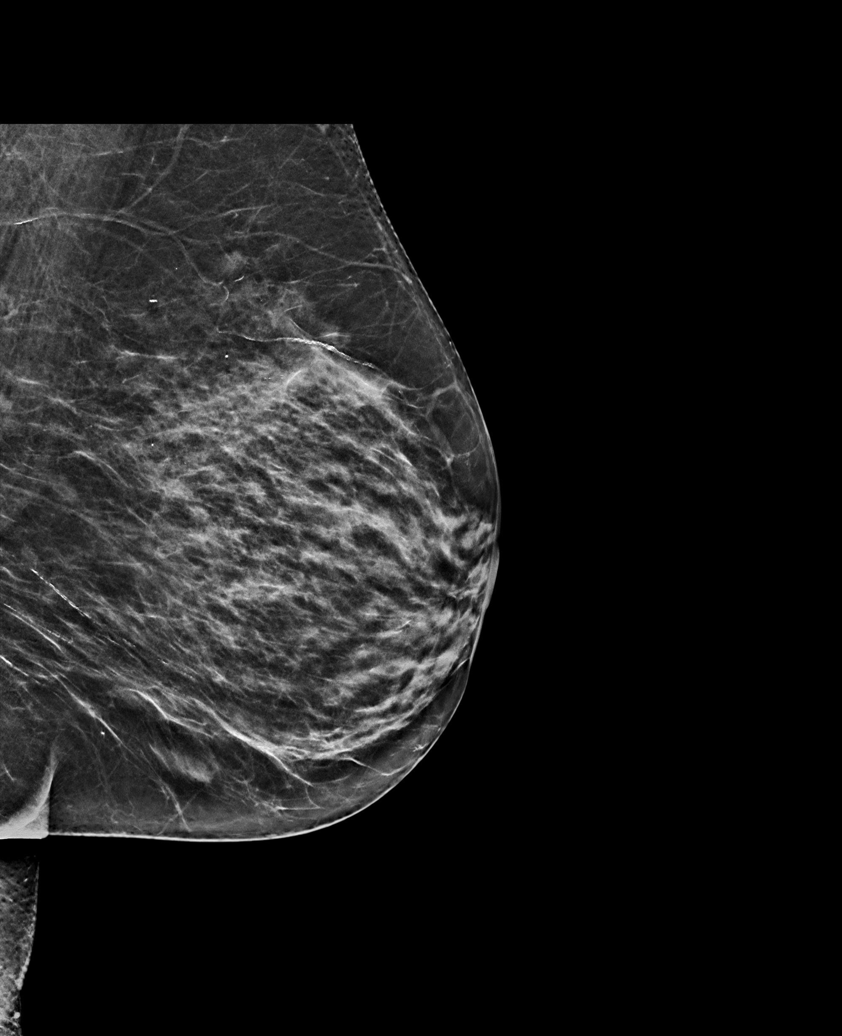

[L CC tomo · tomo slice 31/62.0]
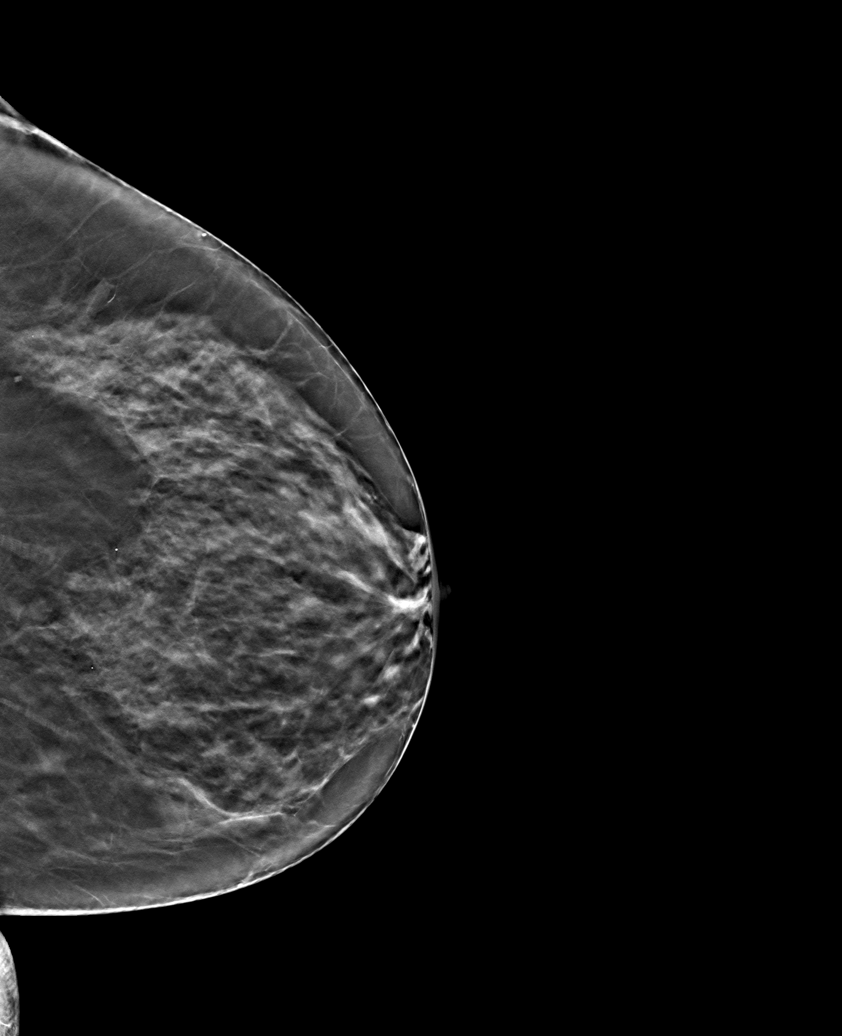

[L MLO tomo · tomo slice 31/60.0]
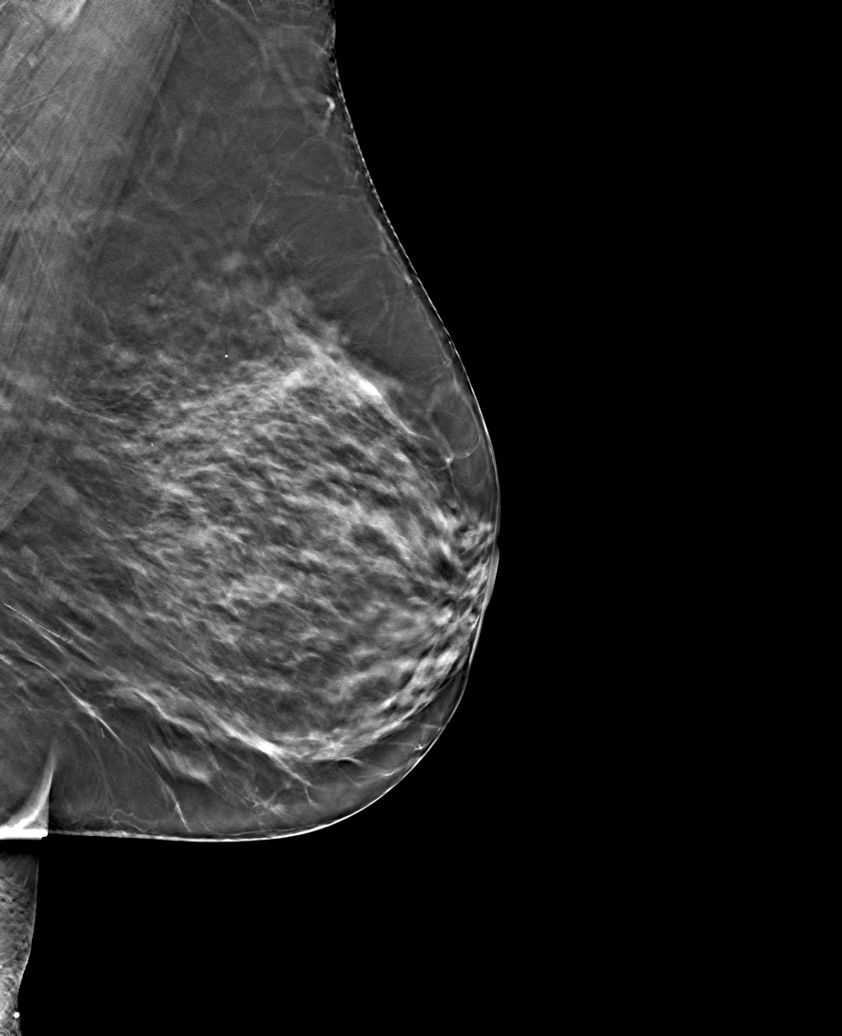

[6 of 14 positions shown; findings below may reference images not displayed]

ACR Breast Density Category c: The breast tissue is heterogeneously
dense, which may obscure small masses.
FINDINGS: Cc and MLO tomograms were performed of the left breast. There is an
oval circumscribed mass in the upper-outer posterior left breast
demonstrating a small fatty notch compatible with an intramammary
lymph node. This is benign in appearance and stable dating back to
prior mammograms from 09/19/2013. There is no mammographic evidence
of malignancy in the left breast.

Mammographic images were processed with CAD.
IMPRESSION: No mammographic evidence of malignancy in the left breast.

RECOMMENDATION:
Screening mammogram in one year.(Code:ZA-M-OI4)

I have discussed the findings and recommendations with the patient.
Results were also provided in writing at the conclusion of the
visit. If applicable, a reminder letter will be sent to the patient
regarding the next appointment.

BI-RADS CATEGORY  2: Benign.

## 2018-09-03 DIAGNOSIS — I4819 Other persistent atrial fibrillation: Secondary | ICD-10-CM | POA: Diagnosis not present

## 2018-09-04 DIAGNOSIS — M549 Dorsalgia, unspecified: Secondary | ICD-10-CM | POA: Diagnosis not present

## 2018-09-06 DIAGNOSIS — J329 Chronic sinusitis, unspecified: Secondary | ICD-10-CM | POA: Diagnosis not present

## 2018-09-06 DIAGNOSIS — F418 Other specified anxiety disorders: Secondary | ICD-10-CM | POA: Diagnosis not present

## 2018-09-06 DIAGNOSIS — J40 Bronchitis, not specified as acute or chronic: Secondary | ICD-10-CM | POA: Diagnosis not present

## 2018-09-07 DIAGNOSIS — F418 Other specified anxiety disorders: Secondary | ICD-10-CM

## 2018-09-07 DIAGNOSIS — J329 Chronic sinusitis, unspecified: Secondary | ICD-10-CM | POA: Insufficient documentation

## 2018-09-07 DIAGNOSIS — J40 Bronchitis, not specified as acute or chronic: Secondary | ICD-10-CM | POA: Insufficient documentation

## 2018-09-07 HISTORY — DX: Chronic sinusitis, unspecified: J32.9

## 2018-09-07 HISTORY — DX: Other specified anxiety disorders: F41.8

## 2018-09-29 DIAGNOSIS — R319 Hematuria, unspecified: Secondary | ICD-10-CM | POA: Diagnosis not present

## 2018-09-29 DIAGNOSIS — J019 Acute sinusitis, unspecified: Secondary | ICD-10-CM | POA: Diagnosis not present

## 2018-09-29 DIAGNOSIS — R300 Dysuria: Secondary | ICD-10-CM | POA: Diagnosis not present

## 2018-09-29 DIAGNOSIS — N390 Urinary tract infection, site not specified: Secondary | ICD-10-CM | POA: Diagnosis not present

## 2018-10-26 DIAGNOSIS — C44311 Basal cell carcinoma of skin of nose: Secondary | ICD-10-CM | POA: Diagnosis not present

## 2018-10-26 DIAGNOSIS — D485 Neoplasm of uncertain behavior of skin: Secondary | ICD-10-CM | POA: Diagnosis not present

## 2018-11-08 DIAGNOSIS — I480 Paroxysmal atrial fibrillation: Secondary | ICD-10-CM | POA: Diagnosis not present

## 2018-11-08 DIAGNOSIS — Z79899 Other long term (current) drug therapy: Secondary | ICD-10-CM | POA: Diagnosis not present

## 2018-11-08 DIAGNOSIS — Z5181 Encounter for therapeutic drug level monitoring: Secondary | ICD-10-CM | POA: Diagnosis not present

## 2018-11-14 DIAGNOSIS — E782 Mixed hyperlipidemia: Secondary | ICD-10-CM | POA: Diagnosis not present

## 2018-11-14 DIAGNOSIS — I471 Supraventricular tachycardia: Secondary | ICD-10-CM | POA: Diagnosis not present

## 2018-11-14 DIAGNOSIS — I10 Essential (primary) hypertension: Secondary | ICD-10-CM | POA: Diagnosis not present

## 2018-11-14 DIAGNOSIS — I480 Paroxysmal atrial fibrillation: Secondary | ICD-10-CM | POA: Diagnosis not present

## 2018-11-14 DIAGNOSIS — E039 Hypothyroidism, unspecified: Secondary | ICD-10-CM | POA: Diagnosis not present

## 2018-11-14 DIAGNOSIS — K219 Gastro-esophageal reflux disease without esophagitis: Secondary | ICD-10-CM | POA: Diagnosis not present

## 2018-11-22 DIAGNOSIS — M25552 Pain in left hip: Secondary | ICD-10-CM

## 2018-11-22 HISTORY — DX: Pain in left hip: M25.552

## 2018-11-29 DIAGNOSIS — Z124 Encounter for screening for malignant neoplasm of cervix: Secondary | ICD-10-CM | POA: Diagnosis not present

## 2018-11-29 DIAGNOSIS — Z1231 Encounter for screening mammogram for malignant neoplasm of breast: Secondary | ICD-10-CM | POA: Diagnosis not present

## 2018-11-29 DIAGNOSIS — Z6827 Body mass index (BMI) 27.0-27.9, adult: Secondary | ICD-10-CM | POA: Diagnosis not present

## 2018-12-07 DIAGNOSIS — Z7901 Long term (current) use of anticoagulants: Secondary | ICD-10-CM | POA: Diagnosis not present

## 2018-12-07 DIAGNOSIS — R5382 Chronic fatigue, unspecified: Secondary | ICD-10-CM | POA: Diagnosis not present

## 2018-12-07 DIAGNOSIS — Z01810 Encounter for preprocedural cardiovascular examination: Secondary | ICD-10-CM | POA: Diagnosis not present

## 2018-12-07 DIAGNOSIS — I10 Essential (primary) hypertension: Secondary | ICD-10-CM | POA: Diagnosis not present

## 2018-12-07 DIAGNOSIS — E039 Hypothyroidism, unspecified: Secondary | ICD-10-CM | POA: Diagnosis not present

## 2018-12-07 DIAGNOSIS — I4819 Other persistent atrial fibrillation: Secondary | ICD-10-CM | POA: Diagnosis not present

## 2018-12-07 DIAGNOSIS — E782 Mixed hyperlipidemia: Secondary | ICD-10-CM | POA: Diagnosis not present

## 2018-12-07 DIAGNOSIS — Z79899 Other long term (current) drug therapy: Secondary | ICD-10-CM | POA: Diagnosis not present

## 2018-12-10 DIAGNOSIS — E782 Mixed hyperlipidemia: Secondary | ICD-10-CM | POA: Diagnosis not present

## 2018-12-10 DIAGNOSIS — I4819 Other persistent atrial fibrillation: Secondary | ICD-10-CM | POA: Diagnosis not present

## 2018-12-10 DIAGNOSIS — I495 Sick sinus syndrome: Secondary | ICD-10-CM | POA: Diagnosis not present

## 2018-12-10 DIAGNOSIS — Z006 Encounter for examination for normal comparison and control in clinical research program: Secondary | ICD-10-CM | POA: Diagnosis not present

## 2018-12-10 DIAGNOSIS — Z7901 Long term (current) use of anticoagulants: Secondary | ICD-10-CM | POA: Diagnosis not present

## 2018-12-10 DIAGNOSIS — I4891 Unspecified atrial fibrillation: Secondary | ICD-10-CM | POA: Diagnosis not present

## 2018-12-10 DIAGNOSIS — E039 Hypothyroidism, unspecified: Secondary | ICD-10-CM | POA: Diagnosis not present

## 2018-12-10 DIAGNOSIS — K449 Diaphragmatic hernia without obstruction or gangrene: Secondary | ICD-10-CM | POA: Diagnosis not present

## 2018-12-10 DIAGNOSIS — I10 Essential (primary) hypertension: Secondary | ICD-10-CM | POA: Diagnosis not present

## 2018-12-10 DIAGNOSIS — I471 Supraventricular tachycardia: Secondary | ICD-10-CM | POA: Diagnosis not present

## 2018-12-10 DIAGNOSIS — Z950 Presence of cardiac pacemaker: Secondary | ICD-10-CM | POA: Diagnosis not present

## 2018-12-11 DIAGNOSIS — E782 Mixed hyperlipidemia: Secondary | ICD-10-CM | POA: Diagnosis not present

## 2018-12-11 DIAGNOSIS — Z006 Encounter for examination for normal comparison and control in clinical research program: Secondary | ICD-10-CM | POA: Diagnosis not present

## 2018-12-11 DIAGNOSIS — I4891 Unspecified atrial fibrillation: Secondary | ICD-10-CM | POA: Diagnosis not present

## 2018-12-11 DIAGNOSIS — I495 Sick sinus syndrome: Secondary | ICD-10-CM | POA: Diagnosis not present

## 2018-12-11 DIAGNOSIS — I4819 Other persistent atrial fibrillation: Secondary | ICD-10-CM | POA: Diagnosis not present

## 2018-12-11 DIAGNOSIS — Z950 Presence of cardiac pacemaker: Secondary | ICD-10-CM

## 2018-12-11 DIAGNOSIS — I10 Essential (primary) hypertension: Secondary | ICD-10-CM | POA: Diagnosis not present

## 2018-12-11 DIAGNOSIS — E039 Hypothyroidism, unspecified: Secondary | ICD-10-CM | POA: Diagnosis not present

## 2018-12-11 HISTORY — DX: Presence of cardiac pacemaker: Z95.0

## 2018-12-19 DIAGNOSIS — Z45018 Encounter for adjustment and management of other part of cardiac pacemaker: Secondary | ICD-10-CM | POA: Insufficient documentation

## 2018-12-19 HISTORY — DX: Encounter for adjustment and management of other part of cardiac pacemaker: Z45.018

## 2018-12-20 DIAGNOSIS — Z45018 Encounter for adjustment and management of other part of cardiac pacemaker: Secondary | ICD-10-CM | POA: Diagnosis not present

## 2018-12-20 DIAGNOSIS — Z950 Presence of cardiac pacemaker: Secondary | ICD-10-CM | POA: Diagnosis not present

## 2018-12-20 DIAGNOSIS — R0602 Shortness of breath: Secondary | ICD-10-CM | POA: Diagnosis not present

## 2018-12-20 DIAGNOSIS — I4819 Other persistent atrial fibrillation: Secondary | ICD-10-CM | POA: Diagnosis not present

## 2018-12-20 DIAGNOSIS — I088 Other rheumatic multiple valve diseases: Secondary | ICD-10-CM | POA: Diagnosis not present

## 2018-12-20 DIAGNOSIS — R001 Bradycardia, unspecified: Secondary | ICD-10-CM | POA: Diagnosis not present

## 2018-12-20 DIAGNOSIS — K222 Esophageal obstruction: Secondary | ICD-10-CM | POA: Diagnosis not present

## 2018-12-20 DIAGNOSIS — R002 Palpitations: Secondary | ICD-10-CM | POA: Diagnosis not present

## 2018-12-20 DIAGNOSIS — R0789 Other chest pain: Secondary | ICD-10-CM | POA: Diagnosis not present

## 2018-12-23 DIAGNOSIS — R0789 Other chest pain: Secondary | ICD-10-CM

## 2018-12-23 HISTORY — DX: Other chest pain: R07.89

## 2018-12-26 DIAGNOSIS — H524 Presbyopia: Secondary | ICD-10-CM | POA: Diagnosis not present

## 2018-12-26 DIAGNOSIS — I480 Paroxysmal atrial fibrillation: Secondary | ICD-10-CM | POA: Diagnosis not present

## 2018-12-26 DIAGNOSIS — H04123 Dry eye syndrome of bilateral lacrimal glands: Secondary | ICD-10-CM | POA: Diagnosis not present

## 2018-12-26 DIAGNOSIS — Z09 Encounter for follow-up examination after completed treatment for conditions other than malignant neoplasm: Secondary | ICD-10-CM | POA: Diagnosis not present

## 2019-03-07 DIAGNOSIS — I4819 Other persistent atrial fibrillation: Secondary | ICD-10-CM | POA: Diagnosis not present

## 2019-03-27 DIAGNOSIS — I10 Essential (primary) hypertension: Secondary | ICD-10-CM | POA: Diagnosis not present

## 2019-03-27 DIAGNOSIS — R5383 Other fatigue: Secondary | ICD-10-CM | POA: Diagnosis not present

## 2019-03-27 DIAGNOSIS — E039 Hypothyroidism, unspecified: Secondary | ICD-10-CM | POA: Diagnosis not present

## 2019-03-27 DIAGNOSIS — R5381 Other malaise: Secondary | ICD-10-CM | POA: Diagnosis not present

## 2019-04-01 DIAGNOSIS — E538 Deficiency of other specified B group vitamins: Secondary | ICD-10-CM | POA: Diagnosis not present

## 2019-05-07 DIAGNOSIS — E538 Deficiency of other specified B group vitamins: Secondary | ICD-10-CM | POA: Diagnosis not present

## 2019-05-09 DIAGNOSIS — R300 Dysuria: Secondary | ICD-10-CM | POA: Diagnosis not present

## 2019-05-28 DIAGNOSIS — I10 Essential (primary) hypertension: Secondary | ICD-10-CM | POA: Diagnosis not present

## 2019-05-28 DIAGNOSIS — I480 Paroxysmal atrial fibrillation: Secondary | ICD-10-CM | POA: Diagnosis not present

## 2019-05-28 DIAGNOSIS — Z0000 Encounter for general adult medical examination without abnormal findings: Secondary | ICD-10-CM | POA: Diagnosis not present

## 2019-05-28 DIAGNOSIS — E039 Hypothyroidism, unspecified: Secondary | ICD-10-CM | POA: Diagnosis not present

## 2019-05-28 DIAGNOSIS — K210 Gastro-esophageal reflux disease with esophagitis: Secondary | ICD-10-CM | POA: Diagnosis not present

## 2019-05-28 DIAGNOSIS — E538 Deficiency of other specified B group vitamins: Secondary | ICD-10-CM | POA: Diagnosis not present

## 2019-05-28 DIAGNOSIS — E782 Mixed hyperlipidemia: Secondary | ICD-10-CM | POA: Diagnosis not present

## 2019-05-30 DIAGNOSIS — Z950 Presence of cardiac pacemaker: Secondary | ICD-10-CM | POA: Diagnosis not present

## 2019-05-30 DIAGNOSIS — Z45018 Encounter for adjustment and management of other part of cardiac pacemaker: Secondary | ICD-10-CM | POA: Diagnosis not present

## 2019-05-30 DIAGNOSIS — I4819 Other persistent atrial fibrillation: Secondary | ICD-10-CM | POA: Diagnosis not present

## 2019-06-11 DIAGNOSIS — E538 Deficiency of other specified B group vitamins: Secondary | ICD-10-CM | POA: Diagnosis not present

## 2019-06-13 ENCOUNTER — Other Ambulatory Visit: Payer: Self-pay

## 2019-06-17 ENCOUNTER — Encounter: Payer: Self-pay | Admitting: Endocrinology

## 2019-06-17 ENCOUNTER — Other Ambulatory Visit: Payer: Self-pay

## 2019-06-17 ENCOUNTER — Ambulatory Visit (INDEPENDENT_AMBULATORY_CARE_PROVIDER_SITE_OTHER): Payer: Medicare Other | Admitting: Endocrinology

## 2019-06-17 VITALS — BP 130/76 | HR 76 | Ht 64.0 in | Wt 167.6 lb

## 2019-06-17 DIAGNOSIS — E559 Vitamin D deficiency, unspecified: Secondary | ICD-10-CM

## 2019-06-17 DIAGNOSIS — E890 Postprocedural hypothyroidism: Secondary | ICD-10-CM | POA: Diagnosis not present

## 2019-06-17 DIAGNOSIS — E210 Primary hyperparathyroidism: Secondary | ICD-10-CM | POA: Diagnosis not present

## 2019-06-17 NOTE — Progress Notes (Signed)
Patient ID: April Padilla, female   DOB: 06/22/1946, 73 y.o.   MRN: [id number]               Reason for Appointment: Endocrinology follow-up visit    History of Present Illness:   Problem 1:  Hypothyroidism was first diagnosed in the 1990s after treatment of hyperthyroidism with radioactive iodine Details are not available She thinks that for several years she had been stable on 88 g of levothyroxine  In 07/2017 she had a routine TSH test and this was found to be high at 7.9 Subsequently her levothyroxine doses have been increased and she has been taking 112 mcg since late 2018 On her last visit in 05/2018 the dose was continued unchanged  However appears that her PCP has reduced her dose to 100 mcg and not clear when, no low TSH levels seen in her record  She is feeling fairly good with her energy level with no new or unusual fatigue Her weight tends to fluctuate No heat or cold intolerance or shakiness She has had issues with atrial arrhythmias and variable blood pressure lately  She is quite regular with taking her levothyroxine in the morning and may not eat for a couple of hours after  Thyroid level was normal on her last visit with her PCP in 03/2019         Patient's weight history is as follows:  Wt Readings from Last 3 Encounters:  06/17/19 167 lb 9.6 oz (76 kg)  06/05/18 161 lb 12.8 oz (73.4 kg)  11/24/17 165 lb (74.8 kg)    Thyroid function results have been as follows:  Lab Results  Component Value Date   TSH 2.36 05/31/2018   TSH 0.93 11/24/2017   FREET4 1.10 05/31/2018   FREET4 1.23 11/24/2017    HYPERCALCEMIA: See review of systems    Past Medical History:  Diagnosis Date  . Dysrhythmia    on toprol for fast heart beat  . H/O hiatal hernia   . Hypothyroidism    pt. states she took radioactive pill and has no thyroid  . Shortness of breath    with exertion    Past Surgical History:  Procedure Laterality Date  . BUNIONECTOMY  yrs. ago    left foot  . FOOT SURGERY  2009   took knot out  and put in screws  . KNEE ARTHROSCOPY W/ MENISCAL REPAIR  2009   left  . TOTAL KNEE ARTHROPLASTY  01/16/2012   Procedure: TOTAL KNEE ARTHROPLASTY;  Surgeon: Rudean Haskell, MD;  Location: [place];  Service: Orthopedics;  Laterality: Left;    Family History  Problem Relation Age of Onset  . Cancer Mother   . Thyroid disease Mother   . Stroke Father   . Anesthesia problems Neg Hx   . Hypotension Neg Hx   . Malignant hyperthermia Neg Hx   . Pseudochol deficiency Neg Hx     Social History:  reports that she has never smoked. She has never used smokeless tobacco. She reports that she does not drink alcohol or use drugs.  Allergies:  Allergies  Allergen Reactions  . Clindamycin/Lincomycin Rash  . Nitrofurantoin Monohyd Macro Rash  . Silver Sulfadiazine Rash    Allergies as of 06/17/2019      Reactions   Clindamycin/lincomycin Rash   Nitrofurantoin Monohyd Macro Rash   Silver Sulfadiazine Rash      Medication List       Accurate as of June 17, 2019  3:09 PM. If you have any questions, ask your nurse or doctor.        ALPRAZolam 0.5 MG tablet Commonly known as: XANAX Take 0.5 mg by mouth daily as needed.   dilTIAZem CD 120 MG 24 hr capsule Generic drug: diltiazem Take 120 mg by mouth 2 (two) times daily.   dofetilide 250 MCG capsule Commonly known as: TIKOSYN dofetilide 250 mcg capsule   Eliquis 5 MG Tabs tablet Generic drug: apixaban Take 5 mg by mouth 2 (two) times daily.   furosemide 20 MG tablet Commonly known as: LASIX Take 20 mg by mouth.   levothyroxine 112 MCG tablet Commonly known as: SYNTHROID Take 100 mcg by mouth daily.   magnesium oxide 400 MG tablet Commonly known as: MAG-OX Take 400 mg by mouth daily.   ranitidine 150 MG tablet Commonly known as: ZANTAC Take by mouth.   rosuvastatin 5 MG tablet Commonly known as: CRESTOR rosuvastatin 5 mg tablet          Review of Systems   HYPERCALCEMIA: She has mild hyperparathyroidism with variable increase in calcium levels Previously had increased PTH level  also This year her calcium levels have been either upper normal or once as high as 10.7 with her PCP   Lab Results  Component Value Date   PTH 76 (H) 11/24/2017   CALCIUM 10.2 05/31/2018    OSTEOPENIA: In 2019 she was found to have osteopenia as follows, this was her first bone density  Bone density in 05/2018:   Lumbar spine L1-L4 Femoral neck (FN) 33% distal radius  T-score -1.6 RFN: -1.5 LFN: -2.1 -2.5  Change in BMD from previous DXA test (%) n/a n/a n/a    VITAMIN D deficiency:  She was found to have a low vitamin D level of 15 in 10/2017  She is taking the OTC vitamin D3, 2000 units daily as recommended  No recent labs available  Lab Results  Component Value Date   VD25OH 33.12 05/31/2018   VD25OH 15.30 (L) 11/24/2017               Examination:    BP 130/76 (BP Location: Left Arm, Patient Position: Sitting, Cuff Size: Normal)   Pulse 76   Ht 5\' 4"  (1.626 m)   Wt 167 lb 9.6 oz (76 kg)   SpO2 96%   BMI 28.77 kg/m     Assessment:  HYPOTHYROIDISM, secondary to I-131 ablation of thyroid for hyperthyroidism  She has had some inconsistency with her levothyroxine requirement and apparently now taking 100 mcg Subjectively doing well Weight is stable We will recheck her thyroid labs today since it has been 3 months since her last lab work   HYPERPARATHYROIDISM: She continues to have mild hypercalcemia which is longstanding related to hyperparathyroidism Again asymptomatic and last calcium level upper normal Has only osteopenia at the hip and borderline osteoporosis at the wrist  VITAMIN D deficiency: This is now being supplemented with 2000 units vitamin D3 Needs follow-up levels  OSTEOPENIA: Since she does not have any other baseline bone density available not clear if she has had any progression Considering her age of 74 her  degree of osteopenia does not warrant any treatment unless she is showing some progression   PLAN:   Labs to be done today as above Unlikely that she will need any surgical intervention for hyperparathyroidism with only mild hypercalcemia and no significant systemic effects   Will need bone density again next year   Elayne Snare 06/17/2019,  3:09 PM     Note: This office note was prepared with Estate agent. Any transcriptional errors that result from this process are unintentional.

## 2019-06-18 LAB — BASIC METABOLIC PANEL
BUN: 14 mg/dL (ref 6–23)
CO2: 31 mEq/L (ref 19–32)
Calcium: 10 mg/dL (ref 8.4–10.5)
Chloride: 101 mEq/L (ref 96–112)
Creatinine, Ser: 1 mg/dL (ref 0.40–1.20)
GFR: 54.02 mL/min — ABNORMAL LOW (ref >60.00)
Glucose, Bld: 86 mg/dL (ref 70–99)
Potassium: 4.6 mEq/L (ref 3.5–5.1)
Sodium: 137 mEq/L (ref 135–145)

## 2019-06-18 LAB — VITAMIN D 25 HYDROXY (VIT D DEFICIENCY, FRACTURES): VITD: 37.79 ng/mL (ref 30.00–100.00)

## 2019-06-18 LAB — TSH: TSH: 4.39 u[IU]/mL (ref 0.35–4.50)

## 2019-06-18 LAB — T4, FREE: Free T4: 1.06 ng/dL (ref 0.60–1.60)

## 2019-06-19 NOTE — Progress Notes (Signed)
Please call to let patient know that the calcium, thyroid and vitamin D results are normal and no further action needed.  Labs sent to PCP

## 2019-07-15 DIAGNOSIS — E538 Deficiency of other specified B group vitamins: Secondary | ICD-10-CM | POA: Diagnosis not present

## 2019-08-12 DIAGNOSIS — Z950 Presence of cardiac pacemaker: Secondary | ICD-10-CM | POA: Diagnosis not present

## 2019-08-12 DIAGNOSIS — Z7901 Long term (current) use of anticoagulants: Secondary | ICD-10-CM | POA: Diagnosis not present

## 2019-08-12 DIAGNOSIS — I4891 Unspecified atrial fibrillation: Secondary | ICD-10-CM | POA: Diagnosis not present

## 2019-08-12 DIAGNOSIS — Z1212 Encounter for screening for malignant neoplasm of rectum: Secondary | ICD-10-CM | POA: Diagnosis not present

## 2019-08-12 DIAGNOSIS — Z45018 Encounter for adjustment and management of other part of cardiac pacemaker: Secondary | ICD-10-CM | POA: Diagnosis not present

## 2019-08-12 DIAGNOSIS — Z1211 Encounter for screening for malignant neoplasm of colon: Secondary | ICD-10-CM | POA: Diagnosis not present

## 2019-08-15 DIAGNOSIS — E538 Deficiency of other specified B group vitamins: Secondary | ICD-10-CM | POA: Diagnosis not present

## 2019-09-05 DIAGNOSIS — I4819 Other persistent atrial fibrillation: Secondary | ICD-10-CM | POA: Diagnosis not present

## 2019-09-05 DIAGNOSIS — Z45018 Encounter for adjustment and management of other part of cardiac pacemaker: Secondary | ICD-10-CM | POA: Diagnosis not present

## 2019-09-05 DIAGNOSIS — Z950 Presence of cardiac pacemaker: Secondary | ICD-10-CM | POA: Diagnosis not present

## 2019-09-16 DIAGNOSIS — E538 Deficiency of other specified B group vitamins: Secondary | ICD-10-CM | POA: Diagnosis not present

## 2019-09-25 ENCOUNTER — Other Ambulatory Visit: Payer: Self-pay

## 2019-11-14 ENCOUNTER — Ambulatory Visit: Payer: Medicare Other | Admitting: Cardiology

## 2020-05-29 ENCOUNTER — Other Ambulatory Visit: Payer: Self-pay

## 2020-05-29 DIAGNOSIS — R0602 Shortness of breath: Secondary | ICD-10-CM | POA: Insufficient documentation

## 2020-05-29 DIAGNOSIS — E039 Hypothyroidism, unspecified: Secondary | ICD-10-CM | POA: Insufficient documentation

## 2020-05-29 DIAGNOSIS — Z8719 Personal history of other diseases of the digestive system: Secondary | ICD-10-CM | POA: Insufficient documentation

## 2020-05-29 DIAGNOSIS — I499 Cardiac arrhythmia, unspecified: Secondary | ICD-10-CM | POA: Insufficient documentation

## 2020-06-01 NOTE — Progress Notes (Signed)
Cardiology Office Note:    Date:  06/02/2020   ID:  April Padilla, April Padilla 1946-05-13, MRN [id number]  PCP:  [name], MD  Cardiologist:  Shirlee More, MD   Referring MD: [name], MD  ASSESSMENT:    1. Longstanding persistent atrial fibrillation ([place])   2. Chronic anticoagulation   3. Sick sinus syndrome ([place])   4. Pacemaker   5. Hypertensive heart disease without heart failure   6. Mixed hyperlipidemia   7. Acquired hypothyroidism    PLAN:    In order of problems listed above:  1. Unfortunately has failed all endeavors to maintain sinus rhythm I told her my opinion her atrial fibrillation is 100% now off her antiarrhythmic drug.  With a low-dose calcium channel blocker still has rapid rates feeling badly and does not tolerate metoprolol with symptomatic hypotension.  She has had discussion about AV nodal ablation would like to avoid if possible we discussed alternatives for rate control I Georgina Peer put her on low-dose digoxin to see if it helps along with a calcium channel blocker check a level in 1 week and if ineffective try different beta-blocker Sectral which often has both less CNS side effects bradycardia and blood pressure lowering. 2. Continue her current anticoagulant moderate stroke risk age female sex hypertension 3. Stable pacemaker function leadless single-chamber she does not have cardiomyopathy and would be a candidate for AV nodal ablation as a last effort to improve the quality of her life with atrial fibrillation if unable to achieve tolerable rate control with standard medications 4. Stable BP at target 5. Stable continue her statin 6. Stable on her current thyroid supplement  Next appointment   Medication Adjustments/Labs and Tests Ordered: Current medicines are reviewed at length with the patient today.  Concerns regarding medicines are outlined above.  No orders of the defined types were placed in this encounter.  No orders of the defined  types were placed in this encounter.    No chief complaint on file. 4 weeks  History of Present Illness:    April Padilla is a 74 y.o. female with a history of atrial fibrillation with multiple EP catheter ablation has failed antiarrhythmic drug therapy including flecainide dronedarone Ranexa and dofetilide.  With bradycardia she had a leadless ventricular pacemaker implanted programmed asynchronous mode VVI rate of 50.  Other problems include hypertension and hyperlipidemia.  She is being seen today for atrial fibrillation and sick sinus syndrome with permanent pacemaker at the request of [name], MD.  She had been seeing Dr. Norm Salt at [facility] and has office visit 04/02/2020 a decision was made to withdraw antiarrhythmic drug therapy and accept rate controlled chronic atrial fibrillation.  Recent labs primary care physician [facility] service 05/12/2020: CMP normal except for GFR 58 cc potassium 4.3 normal LFT  lipid profile at target cholesterol 184 LDL 88 triglycerides 100 HDL 79 Thyroid panel TSH mildly elevated 7.54 T3 uptake total T4 were normal. Echocardiogram performed at [facility] 12/20/2018 shows normal left and right ventricular function INTERPRETATION ---------------------------------------------------------------   NORMAL LEFT VENTRICULAR SYSTOLIC FUNCTION   NORMAL RIGHT VENTRICULAR SYSTOLIC FUNCTION  VALVULAR REGURGITATION: MILD MR, MILD PR, MILD TR  Her pacemaker is followed in device clinic in [place] her last download 04/29/2020 showed normal pacemaker parameters and function 7% RV pacing atrial fibrillation burden 80% and predominant rates controlled 80 to 100 bpm..  NO VALVULAR STENOSIS   MILD TO MODERATE TR   We remember each other I  had actually seen her years ago when I was part of [facility] regional cardiology and had referred her to Dr. Glennon Mac at [facility].  She is going to continue to see him but would like to see me locally and struggle still feeling  badly with atrial fibrillation.  Her predominant complaint is weakness fatigue and rapid heart rate and she often finds heart rates in the 110 to 120 bpm.  She has had trouble tolerating metoprolol with hypotension.  She has peripheral edema with her calcium channel blocker.  We discussed options some of the [place] data shows that digoxin does indeed have a role and I Georgina Peer put her on low-dose 0.125 mg daily fasting level in 1 week with a goal of a level of less than 1 to avoid excess adverse outcome with atrial fibrillation and if that is ineffective try a different beta-blocker Sectral to see if we can achieve tolerable rate control.  If not she may well require AV nodal ablation and she has had some discussion with her electrophysiologist about this.  She is not having shortness of breath or syncope and tolerates her anticoagulant without bleeding.  Reviewed her records from [place] with the patient.  See her back in the office in 1 month to assess her response and she will continue to follow her device in the [place] clinic Past Medical History:  Diagnosis Date  . 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  . Chest discomfort 12/23/2018  . Chronic atrial fibrillation ([place]) 01/26/2016   Last Assessment & Plan:  Has had ablation through [facility] , her BP here was stable , discussed her current meds and the S/E , she is off of the lasix now and will see her CMP for her as well as CK today for her muscle cramps she has had long before her ablation with possible pain from the statin  . Chronic fatigue 07/25/2016  . Chronic idiopathic constipation 01/26/2016  . Dysrhythmia    on toprol for fast heart beat  . Encounter for care of pacemaker 12/19/2018  . Essential (primary) hypertension 01/26/2016  . Family history of colon cancer in mother 07/17/2017  . GERD (gastroesophageal reflux disease) 07/17/2017  . H/O hiatal hernia   . High risk medication use 01/26/2016  . History of  adenomatous polyp of colon 07/17/2017  . Hypercalcemia 01/26/2016  . Hypothyroidism    pt. states she took radioactive pill and has no thyroid  . Irregular heart rate 07/06/2018  . Left shoulder pain 07/06/2018  . Long term current use of anticoagulant 07/06/2018  . Mixed hyperlipidemia 01/26/2016  . Myalgia 05/18/2016   Last Assessment & Plan:  Formatting of this note might be different from the original. Will update her CMP for her and ensure that her potassium and her magnesium are stable for her with the recent myalgias and fatigue post procedure for her  . On continuous oral anticoagulation 08/09/2016  . On dofetilide therapy 01/29/2018  . Pain of left hip joint 11/22/2018  . 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 Presence of left artificial knee joint 01/22/2015  . Primary generalized (osteo)arthritis 01/26/2016  . Primary hyperparathyroidism ([place]) 11/26/2017  . Primary osteoarthritis involving multiple joints 01/26/2016  . PSVT (paroxysmal supraventricular tachycardia) ([place]) 01/26/2016  . Recurrent urticaria 01/26/2016  . S/P TKR (total knee replacement) 12/27/2012  . Shortness of breath    with exertion  . Sinobronchitis 09/07/2018  . Situational anxiety 09/07/2018  . Thyroid function test abnormal 08/19/2016  . Vitamin  D deficiency 11/26/2017    Past Surgical History:  Procedure Laterality Date  . BUNIONECTOMY  yrs. ago   left foot  . FOOT SURGERY  2009   took knot out  and put in screws  . KNEE ARTHROSCOPY W/ MENISCAL REPAIR  2009   left  . TOTAL KNEE ARTHROPLASTY  01/16/2012   Procedure: TOTAL KNEE ARTHROPLASTY;  Surgeon: Rudean Haskell, MD;  Location: [facility];  Service: Orthopedics;  Laterality: Left;    Current Medications: Current Meds  Medication Sig  . ALPRAZolam (XANAX) 0.5 MG tablet Take 0.5 mg by mouth daily as needed.   Marland Kitchen apixaban (ELIQUIS) 5 MG TABS tablet Take 5 mg by mouth 2 (two) times daily.   Marland Kitchen diltiazem (CARDIZEM SR) 60 MG 12 hr capsule Take 60 mg by mouth in the  morning and at bedtime. TAKE AT 3PM AND 8PM  . diltiazem (DILTIAZEM CD) 120 MG 24 hr capsule Take 120 mg by mouth daily. ONLY IN THE AM  . famotidine (PEPCID) 20 MG tablet Take 1 tablet by mouth in the morning and at bedtime.  . furosemide (LASIX) 20 MG tablet Take 20 mg by mouth.  . levothyroxine (SYNTHROID, LEVOTHROID) 112 MCG tablet Take 100 mcg by mouth daily.   . magnesium oxide (MAG-OX) 400 MG tablet Take 400 mg by mouth daily.  . metoprolol tartrate (LOPRESSOR) 25 MG tablet Take 1 tablet by mouth every 6 (six) hours as needed.  . mupirocin ointment (BACTROBAN) 2 % mupirocin 2 % topical ointment  APPLY TOPICALLY 2 (TWO) TIMES DAILY AS NEEDED.  Marland Kitchen rosuvastatin (CRESTOR) 5 MG tablet rosuvastatin 5 mg tablet  . [DISCONTINUED] cephALEXin (KEFLEX) 500 MG capsule Take 1 capsule by mouth 3 (three) times daily.  . [DISCONTINUED] dofetilide (TIKOSYN) 250 MCG capsule dofetilide 250 mcg capsule  . [DISCONTINUED] predniSONE (DELTASONE) 50 MG tablet prednisone 50 mg tablet  TAKE 1 TABLET BY MOUTH 13 HOURS PRIOR, 7 HOURS PRIOR, AND 1 HOUR PRIOR TO CT SCAN  . [DISCONTINUED] ranitidine (ZANTAC) 150 MG tablet Take by mouth.     Allergies:   Clindamycin/lincomycin, Nitrofurantoin monohyd macro, and Silver sulfadiazine   Social History   Socioeconomic History  . Marital status: Married    Spouse name: Not on file  . Number of children: Not on file  . Years of education: Not on file  . Highest education level: Not on file  Occupational History  . Not on file  Tobacco Use  . Smoking status: Never Smoker  . Smokeless tobacco: Never Used  Substance and Sexual Activity  . Alcohol use: No    Comment: occasional  . Drug use: No  . Sexual activity: Yes  Other Topics Concern  . Not on file  Social History Narrative  . Not on file   Social Determinants of Health   Financial Resource Strain:   . Difficulty of Paying Living Expenses:   Food Insecurity:   . Worried About Charity fundraiser in  the Last Year:   . Arboriculturist in the Last Year:   Transportation Needs:   . Film/video editor (Medical):   Marland Kitchen Lack of Transportation (Non-Medical):   Physical Activity:   . Days of Exercise per Week:   . Minutes of Exercise per Session:   Stress:   . Feeling of Stress :   Social Connections:   . Frequency of Communication with Friends and Family:   . Frequency of Social Gatherings with Friends and Family:   .  Attends Religious Services:   . Active Member of Clubs or Organizations:   . Attends Archivist Meetings:   Marland Kitchen Marital Status:      Family History: The patient's family history includes Cancer in her mother; Stroke in her father; Thyroid disease in her mother. There is no history of Anesthesia problems, Hypotension, Malignant hyperthermia, or Pseudochol deficiency.  ROS:   Review of Systems  Constitutional: Positive for malaise/fatigue.  HENT: Negative.   Eyes: Negative.   Cardiovascular: Positive for leg swelling and palpitations.  Respiratory: Negative.   Endocrine: Negative.   Hematologic/Lymphatic: Negative.   Skin: Negative.   Musculoskeletal: Negative.   Gastrointestinal: Negative.   Genitourinary: Negative.   Neurological: Negative.   Psychiatric/Behavioral: Negative.   Allergic/Immunologic: Negative.    Please see the history of present illness.     All other systems reviewed and are negative.  EKGs/Labs/Other Studies Reviewed:    The following studies were reviewed today:   EKG:  EKG is  ordered today.  The ekg ordered today is personally reviewed and demonstrates Atrial fibrillation rate is controlled at rest 83 bpm QS V1 to V3 lead placement versus old anterior septal MI    Physical Exam:    VS:  BP 124/66   Pulse 83   Ht 5\' 6"  (1.676 m)   Wt 164 lb (74.4 kg)   SpO2 98%   BMI 26.47 kg/m     Wt Readings from Last 3 Encounters:  06/02/20 164 lb (74.4 kg)  06/17/19 167 lb 9.6 oz (76 kg)  06/05/18 161 lb 12.8 oz (73.4 kg)      GEN: Looks younger than her age not chronically ill well nourished, well developed in no acute distress HEENT: Normal NECK: No JVD; No carotid bruits LYMPHATICS: No lymphadenopathy CARDIAC: Irregular S1 variable S2 normal no murmur RESPIRATORY:  Clear to auscultation without rales, wheezing or rhonchi  ABDOMEN: Soft, non-tender, non-distended MUSCULOSKELETAL: Trace bilateral at the ankle edema; No deformity  SKIN: Warm and dry NEUROLOGIC:  Alert and oriented x 3 PSYCHIATRIC:  Normal affect     Signed, Shirlee More, MD  06/02/2020 9:06 AM    [place]

## 2020-06-02 ENCOUNTER — Ambulatory Visit (INDEPENDENT_AMBULATORY_CARE_PROVIDER_SITE_OTHER): Payer: Medicare Other | Admitting: Cardiology

## 2020-06-02 ENCOUNTER — Other Ambulatory Visit: Payer: Self-pay

## 2020-06-02 VITALS — BP 124/66 | HR 83 | Ht 66.0 in | Wt 164.0 lb

## 2020-06-02 DIAGNOSIS — Z7901 Long term (current) use of anticoagulants: Secondary | ICD-10-CM | POA: Diagnosis not present

## 2020-06-02 DIAGNOSIS — I4811 Longstanding persistent atrial fibrillation: Secondary | ICD-10-CM | POA: Diagnosis not present

## 2020-06-02 DIAGNOSIS — Z950 Presence of cardiac pacemaker: Secondary | ICD-10-CM

## 2020-06-02 DIAGNOSIS — I119 Hypertensive heart disease without heart failure: Secondary | ICD-10-CM

## 2020-06-02 DIAGNOSIS — E782 Mixed hyperlipidemia: Secondary | ICD-10-CM

## 2020-06-02 DIAGNOSIS — I495 Sick sinus syndrome: Secondary | ICD-10-CM

## 2020-06-02 DIAGNOSIS — E039 Hypothyroidism, unspecified: Secondary | ICD-10-CM

## 2020-06-02 DIAGNOSIS — Z79899 Other long term (current) drug therapy: Secondary | ICD-10-CM

## 2020-06-02 HISTORY — DX: Hypertensive heart disease without heart failure: I11.9

## 2020-06-02 MED ORDER — DIGOXIN 125 MCG PO TABS: 0.1250 mg | ORAL_TABLET | Freq: Every day | ORAL | 0 refills | Status: DC

## 2020-06-02 MED ORDER — MUPIROCIN 2 % EX OINT: TOPICAL_OINTMENT | CUTANEOUS | 0 refills | Status: DC

## 2020-06-02 NOTE — Patient Instructions (Signed)
Medication Instructions:  Your physician has recommended you make the following change in your medication:  START: Digoxin 0.125 mg take one tablet by mouth daily *If you need a refill on your cardiac medications before your next appointment, please call your pharmacy*   Lab Work: Your physician recommends that you return for lab work in: 1 week Please come in fasting to have this lab work done: Digoxin level If you have labs (blood work) drawn today and your tests are completely normal, you will receive your results only by: Marland Kitchen MyChart Message (if you have MyChart) OR . A paper copy in the mail If you have any lab test that is abnormal or we need to change your treatment, we will call you to review the results.   Testing/Procedures: None   Follow-Up: At [facility], you and your health needs are our priority.  As part of our continuing mission to provide you with exceptional heart care, we have created designated Provider Care Teams.  These Care Teams include your primary Cardiologist (physician) and Advanced Practice Providers (APPs -  Physician Assistants and Nurse Practitioners) who all work together to provide you with the care you need, when you need it.  We recommend signing up for the patient portal called "MyChart".  Sign up information is provided on this After Visit Summary.  MyChart is used to connect with patients for Virtual Visits (Telemedicine).  Patients are able to view lab/test results, encounter notes, upcoming appointments, etc.  Non-urgent messages can be sent to your provider as well.   To learn more about what you can do with MyChart, go to [email or web address].    Your next appointment:   4 week(s)  The format for your next appointment:   In Person  Provider:   Shirlee More, MD   Other Instructions

## 2020-06-08 LAB — DIGOXIN LEVEL: Digoxin, Serum: 0.6 ng/mL (ref 0.5–0.9)

## 2020-06-09 ENCOUNTER — Telehealth: Payer: Self-pay

## 2020-06-09 NOTE — Telephone Encounter (Signed)
Spoke with patient regarding results and recommendation.  Patient verbalizes understanding and is agreeable to plan of care. Advised patient to call back with any issues or concerns.  

## 2020-06-09 NOTE — Telephone Encounter (Signed)
-----   Message from Berniece Salines, DO sent at 06/09/2020  8:31 AM EDT ----- Digoxin level level is normal

## 2020-06-15 ENCOUNTER — Ambulatory Visit: Payer: Medicare Other | Admitting: Endocrinology

## 2020-06-16 ENCOUNTER — Ambulatory Visit: Payer: Medicare Other | Admitting: Endocrinology

## 2020-06-24 ENCOUNTER — Other Ambulatory Visit: Payer: Self-pay | Admitting: Cardiology

## 2020-06-28 NOTE — Progress Notes (Signed)
Cardiology Office Note:    Date:  06/29/2020   ID:  April Padilla, April Padilla Feb 01, 1946, MRN [id number]  PCP:  [name], MD  Cardiologist:  Shirlee More, MD    Referring MD: [name], MD    ASSESSMENT:    1. Longstanding persistent atrial fibrillation ([place])   2. High risk medication use   3. Chronic anticoagulation   4. Sick sinus syndrome ([place])   5. Pacemaker   6. Hypertensive heart disease without heart failure    PLAN:    In order of problems listed above:  1. Unfortunately she has a poor quality of life from her atrial fibrillation which is longstanding persistent.  She has failed antiarrhythmic drug therapy failed EP interventions has difficulty tolerating medications with hypotension edema fatigue and not significantly improved with the addition of digitalis.  Her level was not toxic.  Applied 3-day ZIO to assess her heart rate control I told her I think she should consider AV nodal ablation and if it has 1 role in the management of these diseases to improve the quality of life in individuals cost between rapid rates and side effects of the medications.  I have asked her to continue digoxin and her current anticoagulant.  Her pacemaker is followed at [facility] and is leadless with limited telemetry. 2. BP at target does not tolerate the beta-blocker when she has rapid rate   Next appointment: 6 months   Medication Adjustments/Labs and Tests Ordered: Current medicines are reviewed at length with the patient today.  Concerns regarding medicines are outlined above.  Orders Placed This Encounter  Procedures  . Basic metabolic panel  . CBC  . LONG TERM MONITOR (3-14 DAYS)   No orders of the defined types were placed in this encounter.   Chief Complaint  Patient presents with  . Follow-up  . Atrial Fibrillation    History of Present Illness:    April Padilla is a 74 y.o. female with a hx of  atrial fibrillation with multiple EP catheter ablation has  failed antiarrhythmic drug therapy including flecainide dronedarone Ranexa and dofetilide.  With bradycardia she had a leadless ventricular pacemaker implanted programmed asynchronous mode VVI rate of 50.  Other problems include hypertension and hyperlipidemia.  She is being seen today for atrial fibrillation and sick sinus syndrome with permanent pacemaker at the request of [name], MD.  She had been seeing Dr. Norm Salt at [facility] and has office visit 04/02/2020 a decision was made to withdraw antiarrhythmic drug therapy and accept rate controlled chronic atrial fibrillation.  She had an echocardiogram performed at [facility] 12/20/2018 showed normal left and right ventricular function mild mitral regurgitation mild tricuspid regurgitation. She was last seen by me 06/02/2020 to reestablish cardiology and initiated on digoxin..  She continues EP care through Dr. Norm Salt at [facility].  Digoxin level was at target less than 1 when performed 06/08/2020 Compliance with diet, lifestyle and medications: Yes  Initially with retrosternal she felt better however she is not feeling well her heart rates generally are less than 100 but at times are greater than 100 and she cannot tolerate metoprolol because of hypotension and she also has peripheral edema due to calcium channel blocker.  Her biggest problem is malaise and fatigue.  She is considering AV nodal ablation I told her I think she is at the point we were to improve the quality of her life.  She questions whether other problems are present anemia electrolyte abnormality  we will check labs today her pacemaker does not give a good heart rate sensor and will apply a 3-day ZIO monitor to assess heart rate control.  She will continue to follow-up at [place] not having edema shortness of breath or bleeding with her anticoagulant. Past Medical History:  Diagnosis Date  . 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  . Chest  discomfort 12/23/2018  . Chronic atrial fibrillation ([place]) 01/26/2016   Last Assessment & Plan:  Has had ablation through [facility] , her BP here was stable , discussed her current meds and the S/E , she is off of the lasix now and will see her CMP for her as well as CK today for her muscle cramps she has had long before her ablation with possible pain from the statin  . Chronic fatigue 07/25/2016  . Chronic idiopathic constipation 01/26/2016  . Dysrhythmia    on toprol for fast heart beat  . Encounter for care of pacemaker 12/19/2018  . Essential (primary) hypertension 01/26/2016  . Family history of colon cancer in mother 07/17/2017  . GERD (gastroesophageal reflux disease) 07/17/2017  . H/O hiatal hernia   . High risk medication use 01/26/2016  . History of adenomatous polyp of colon 07/17/2017  . Hypercalcemia 01/26/2016  . Hypothyroidism    pt. states she took radioactive pill and has no thyroid  . Irregular heart rate 07/06/2018  . Left shoulder pain 07/06/2018  . Long term current use of anticoagulant 07/06/2018  . Mixed hyperlipidemia 01/26/2016  . Myalgia 05/18/2016   Last Assessment & Plan:  Formatting of this note might be different from the original. Will update her CMP for her and ensure that her potassium and her magnesium are stable for her with the recent myalgias and fatigue post procedure for her  . On continuous oral anticoagulation 08/09/2016  . On dofetilide therapy 01/29/2018  . Pain of left hip joint 11/22/2018  . 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 Presence of left artificial knee joint 01/22/2015  . Primary generalized (osteo)arthritis 01/26/2016  . Primary hyperparathyroidism ([place]) 11/26/2017  . Primary osteoarthritis involving multiple joints 01/26/2016  . PSVT (paroxysmal supraventricular tachycardia) ([place]) 01/26/2016  . Recurrent urticaria 01/26/2016  . S/P TKR (total knee replacement) 12/27/2012  . Shortness of breath    with exertion  . Sinobronchitis 09/07/2018  . Situational  anxiety 09/07/2018  . Thyroid function test abnormal 08/19/2016  . Vitamin D deficiency 11/26/2017    Past Surgical History:  Procedure Laterality Date  . BUNIONECTOMY  yrs. ago   left foot  . FOOT SURGERY  2009   took knot out  and put in screws  . KNEE ARTHROSCOPY W/ MENISCAL REPAIR  2009   left  . TOTAL KNEE ARTHROPLASTY  01/16/2012   Procedure: TOTAL KNEE ARTHROPLASTY;  Surgeon: Rudean Haskell, MD;  Location: [place];  Service: Orthopedics;  Laterality: Left;    Current Medications: Current Meds  Medication Sig  . ALPRAZolam (XANAX) 0.5 MG tablet Take 0.5 mg by mouth daily as needed.   Marland Kitchen apixaban (ELIQUIS) 5 MG TABS tablet Take 5 mg by mouth 2 (two) times daily.   . digoxin (LANOXIN) 0.125 MG tablet TAKE 1 TABLET BY MOUTH DAILY  . diltiazem (CARDIZEM SR) 60 MG 12 hr capsule Take 60 mg by mouth in the morning and at bedtime. TAKE AT 3PM AND 8PM  . diltiazem (DILTIAZEM CD) 120 MG 24 hr capsule Take 120 mg by mouth daily. ONLY IN THE AM  .  famotidine (PEPCID) 20 MG tablet Take 1 tablet by mouth in the morning and at bedtime.  . furosemide (LASIX) 20 MG tablet Take 20 mg by mouth.  . levothyroxine (SYNTHROID) 100 MCG tablet Take 100 mcg by mouth every morning.  . magnesium oxide (MAG-OX) 400 MG tablet Take 400 mg by mouth daily.  . metoprolol tartrate (LOPRESSOR) 25 MG tablet Take 1 tablet by mouth every 6 (six) hours as needed.  . mupirocin ointment (BACTROBAN) 2 % mupirocin 2 % topical ointment  APPLY TOPICALLY 2 (TWO) TIMES DAILY AS NEEDED.  Marland Kitchen rosuvastatin (CRESTOR) 5 MG tablet rosuvastatin 5 mg tablet     Allergies:   Clindamycin/lincomycin, Nitrofurantoin monohyd macro, and Silver sulfadiazine   Social History   Socioeconomic History  . Marital status: Married    Spouse name: Not on file  . Number of children: Not on file  . Years of education: Not on file  . Highest education level: Not on file  Occupational History  . Not on file  Tobacco Use  . Smoking status:  Never Smoker  . Smokeless tobacco: Never Used  Substance and Sexual Activity  . Alcohol use: No    Comment: occasional  . Drug use: No  . Sexual activity: Yes  Other Topics Concern  . Not on file  Social History Narrative  . Not on file   Social Determinants of Health   Financial Resource Strain:   . Difficulty of Paying Living Expenses: Not on file  Food Insecurity:   . Worried About Charity fundraiser in the Last Year: Not on file  . Ran Out of Food in the Last Year: Not on file  Transportation Needs:   . Lack of Transportation (Medical): Not on file  . Lack of Transportation (Non-Medical): Not on file  Physical Activity:   . Days of Exercise per Week: Not on file  . Minutes of Exercise per Session: Not on file  Stress:   . Feeling of Stress : Not on file  Social Connections:   . Frequency of Communication with Friends and Family: Not on file  . Frequency of Social Gatherings with Friends and Family: Not on file  . Attends Religious Services: Not on file  . Active Member of Clubs or Organizations: Not on file  . Attends Archivist Meetings: Not on file  . Marital Status: Not on file     Family History: The patient's family history includes Cancer in her mother; Stroke in her father; Thyroid disease in her mother. There is no history of Anesthesia problems, Hypotension, Malignant hyperthermia, or Pseudochol deficiency. ROS:   Please see the history of present illness.    All other systems reviewed and are negative.  EKGs/Labs/Other Studies Reviewed:    The following studies were reviewed today:    Recent Labs: No results found for requested labs within last 8760 hours.  Recent Lipid Panel No results found for: CHOL, TRIG, HDL, CHOLHDL, VLDL, LDLCALC, LDLDIRECT  Physical Exam:    VS:  BP 112/64   Pulse 95   Ht 5\' 6"  (1.676 m)   Wt 169 lb (76.7 kg)   SpO2 94%   BMI 27.28 kg/m     Wt Readings from Last 3 Encounters:  06/29/20 169 lb (76.7 kg)    06/02/20 164 lb (74.4 kg)  06/17/19 167 lb 9.6 oz (76 kg)     GEN:  Well nourished, well developed in no acute distress HEENT: Normal NECK: No JVD; No carotid  bruits LYMPHATICS: No lymphadenopathy CARDIAC: Irregular S1 variable RRR, no murmurs, rubs, gallops RESPIRATORY:  Clear to auscultation without rales, wheezing or rhonchi  ABDOMEN: Soft, non-tender, non-distended MUSCULOSKELETAL: 1-2+ lower extremity nonpitting bilateral edema; No deformity  SKIN: Warm and dry NEUROLOGIC:  Alert and oriented x 3 PSYCHIATRIC:  Normal affect    Signed, Shirlee More, MD  06/29/2020 8:54 AM    [place]

## 2020-06-29 ENCOUNTER — Ambulatory Visit (INDEPENDENT_AMBULATORY_CARE_PROVIDER_SITE_OTHER): Payer: Medicare Other

## 2020-06-29 ENCOUNTER — Ambulatory Visit (INDEPENDENT_AMBULATORY_CARE_PROVIDER_SITE_OTHER): Payer: Medicare Other | Admitting: Cardiology

## 2020-06-29 ENCOUNTER — Other Ambulatory Visit: Payer: Self-pay

## 2020-06-29 ENCOUNTER — Encounter: Payer: Self-pay | Admitting: Cardiology

## 2020-06-29 VITALS — BP 112/64 | HR 95 | Ht 66.0 in | Wt 169.0 lb

## 2020-06-29 DIAGNOSIS — I495 Sick sinus syndrome: Secondary | ICD-10-CM | POA: Diagnosis not present

## 2020-06-29 DIAGNOSIS — Z79899 Other long term (current) drug therapy: Secondary | ICD-10-CM

## 2020-06-29 DIAGNOSIS — Z7901 Long term (current) use of anticoagulants: Secondary | ICD-10-CM

## 2020-06-29 DIAGNOSIS — I4811 Longstanding persistent atrial fibrillation: Secondary | ICD-10-CM

## 2020-06-29 DIAGNOSIS — I119 Hypertensive heart disease without heart failure: Secondary | ICD-10-CM

## 2020-06-29 DIAGNOSIS — Z950 Presence of cardiac pacemaker: Secondary | ICD-10-CM

## 2020-06-29 NOTE — Patient Instructions (Addendum)
Medication Instructions:  Your physician recommends that you continue on your current medications as directed. Please refer to the Current Medication list given to you today.  *If you need a refill on your cardiac medications before your next appointment, please call your pharmacy*   Lab Work: Your physician recommends that you return for lab work in: TODAY CBC, BMP If you have labs (blood work) drawn today and your tests are completely normal, you will receive your results only by: Marland Kitchen MyChart Message (if you have MyChart) OR . A paper copy in the mail If you have any lab test that is abnormal or we need to change your treatment, we will call you to review the results.   Testing/Procedures: A zio monitor was ordered today. It will remain on for 3 days. You will then return monitor and event diary in provided box. It takes 1-2 weeks for report to be downloaded and returned to [place]. We will call you with the results. If monitor falls off or has orange flashing light, please call Zio for further instructions.      Follow-Up: At [facility], you and your health needs are our priority.  As part of our continuing mission to provide you with exceptional heart care, we have created designated Provider Care Teams.  These Care Teams include your primary Cardiologist (physician) and Advanced Practice Providers (APPs -  Physician Assistants and Nurse Practitioners) who all work together to provide you with the care you need, when you need it.  We recommend signing up for the patient portal called "MyChart".  Sign up information is provided on this After Visit Summary.  MyChart is used to connect with patients for Virtual Visits (Telemedicine).  Patients are able to view lab/test results, encounter notes, upcoming appointments, etc.  Non-urgent messages can be sent to your provider as well.   To learn more about what you can do with MyChart, go to [email or web address].    Your next appointment:   6  month(s)  The format for your next appointment:   In Person  Provider:   Shirlee More, MD   Other Instructions

## 2020-06-30 LAB — BASIC METABOLIC PANEL
BUN/Creatinine Ratio: 16 (ref 12–28)
BUN: 16 mg/dL (ref 8–27)
CO2: 24 mmol/L (ref 20–29)
Calcium: 10.2 mg/dL (ref 8.7–10.3)
Chloride: 103 mmol/L (ref 96–106)
Creatinine, Ser: 0.97 mg/dL (ref 0.57–1.00)
GFR calc Af Amer: 66 mL/min/{1.73_m2} (ref >59)
GFR calc non Af Amer: 57 mL/min/{1.73_m2} — ABNORMAL LOW (ref >59)
Glucose: 127 mg/dL — ABNORMAL HIGH (ref 65–99)
Potassium: 4.6 mmol/L (ref 3.5–5.2)
Sodium: 141 mmol/L (ref 134–144)

## 2020-06-30 LAB — CBC
Hematocrit: 37.9 % (ref 34.0–46.6)
Hemoglobin: 12.6 g/dL (ref 11.1–15.9)
MCH: 30.9 pg (ref 26.6–33.0)
MCHC: 33.2 g/dL (ref 31.5–35.7)
MCV: 93 fL (ref 79–97)
Platelets: 249 10*3/uL (ref 150–450)
RBC: 4.08 x10E6/uL (ref 3.77–5.28)
RDW: 11.9 % (ref 11.7–15.4)
WBC: 4.8 10*3/uL (ref 3.4–10.8)

## 2020-07-08 ENCOUNTER — Ambulatory Visit: Payer: Medicare Other | Admitting: Endocrinology

## 2020-07-13 ENCOUNTER — Other Ambulatory Visit: Payer: Self-pay

## 2020-07-13 ENCOUNTER — Ambulatory Visit (INDEPENDENT_AMBULATORY_CARE_PROVIDER_SITE_OTHER): Payer: Medicare Other | Admitting: Endocrinology

## 2020-07-13 ENCOUNTER — Encounter: Payer: Self-pay | Admitting: Endocrinology

## 2020-07-13 VITALS — BP 118/68 | HR 100 | Ht 65.0 in | Wt 167.0 lb

## 2020-07-13 DIAGNOSIS — E559 Vitamin D deficiency, unspecified: Secondary | ICD-10-CM | POA: Diagnosis not present

## 2020-07-13 DIAGNOSIS — E890 Postprocedural hypothyroidism: Secondary | ICD-10-CM

## 2020-07-13 DIAGNOSIS — M8580 Other specified disorders of bone density and structure, unspecified site: Secondary | ICD-10-CM

## 2020-07-13 DIAGNOSIS — E210 Primary hyperparathyroidism: Secondary | ICD-10-CM | POA: Diagnosis not present

## 2020-07-13 NOTE — Progress Notes (Signed)
Patient ID: April Padilla, female   DOB: 04-Oct-1946, 74 y.o.   MRN: [id number]               Reason for Appointment: Endocrinology follow-up visit    History of Present Illness:   Problem 1:  Hypothyroidism was first diagnosed in the 1990s after treatment of hyperthyroidism with radioactive iodine Details are not available She thinks that for several years she had been stable on 88 g of levothyroxine  In 07/2017 she had a routine TSH test and this was found to be high at 7.9 Subsequently her levothyroxine doses have been increased and she has been taking 112 mcg since late 2018  Subsequently her dose was reduced to 100 mcg levothyroxine by her PCP With this her TSH was normal in 06/2019  More recently has not complained of any unusual fatigue, she thinks that she stays tired because of atrial fibrillation No heat or cold intolerance, no recent significant weight change  She is quite regular with taking her levothyroxine in the morning at least an hour before breakfast  Thyroid level needs to be checked today         Patient's weight history is as follows:  Wt Readings from Last 3 Encounters:  07/13/20 167 lb (75.8 kg)  06/29/20 169 lb (76.7 kg)  06/02/20 164 lb (74.4 kg)    Thyroid function results have been as follows:  Lab Results  Component Value Date   TSH 4.39 06/17/2019   TSH 2.36 05/31/2018   TSH 0.93 11/24/2017   FREET4 1.06 06/17/2019   FREET4 1.10 05/31/2018   FREET4 1.23 11/24/2017    HYPERCALCEMIA: See review of systems    Past Medical History:  Diagnosis Date  . 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  . Chest discomfort 12/23/2018  . Chronic atrial fibrillation ([place]) 01/26/2016   Last Assessment & Plan:  Has had ablation through [facility] , her BP here was stable , discussed her current meds and the S/E , she is off of the lasix now and will see her CMP for her as well as CK today for her muscle cramps she has had  long before her ablation with possible pain from the statin  . Chronic fatigue 07/25/2016  . Chronic idiopathic constipation 01/26/2016  . Dysrhythmia    on toprol for fast heart beat  . Encounter for care of pacemaker 12/19/2018  . Essential (primary) hypertension 01/26/2016  . Family history of colon cancer in mother 07/17/2017  . GERD (gastroesophageal reflux disease) 07/17/2017  . H/O hiatal hernia   . High risk medication use 01/26/2016  . History of adenomatous polyp of colon 07/17/2017  . Hypercalcemia 01/26/2016  . Hypothyroidism    pt. states she took radioactive pill and has no thyroid  . Irregular heart rate 07/06/2018  . Left shoulder pain 07/06/2018  . Long term current use of anticoagulant 07/06/2018  . Mixed hyperlipidemia 01/26/2016  . Myalgia 05/18/2016   Last Assessment & Plan:  Formatting of this note might be different from the original. Will update her CMP for her and ensure that her potassium and her magnesium are stable for her with the recent myalgias and fatigue post procedure for her  . On continuous oral anticoagulation 08/09/2016  . On dofetilide therapy 01/29/2018  . Pain of left hip joint 11/22/2018  . 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 Presence of left artificial knee joint 01/22/2015  . Primary generalized (osteo)arthritis 01/26/2016  . Primary hyperparathyroidism ([place])  11/26/2017  . Primary osteoarthritis involving multiple joints 01/26/2016  . PSVT (paroxysmal supraventricular tachycardia) ([place]) 01/26/2016  . Recurrent urticaria 01/26/2016  . S/P TKR (total knee replacement) 12/27/2012  . Shortness of breath    with exertion  . Sinobronchitis 09/07/2018  . Situational anxiety 09/07/2018  . Thyroid function test abnormal 08/19/2016  . Vitamin D deficiency 11/26/2017    Past Surgical History:  Procedure Laterality Date  . BUNIONECTOMY  yrs. ago   left foot  . FOOT SURGERY  2009   took knot out  and put in screws  . KNEE ARTHROSCOPY W/ MENISCAL REPAIR  2009    left  . TOTAL KNEE ARTHROPLASTY  01/16/2012   Procedure: TOTAL KNEE ARTHROPLASTY;  Surgeon: Rudean Haskell, MD;  Location: [place];  Service: Orthopedics;  Laterality: Left;    Family History  Problem Relation Age of Onset  . Cancer Mother   . Thyroid disease Mother   . Stroke Father   . Anesthesia problems Neg Hx   . Hypotension Neg Hx   . Malignant hyperthermia Neg Hx   . Pseudochol deficiency Neg Hx     Social History:  reports that she has never smoked. She has never used smokeless tobacco. She reports that she does not drink alcohol and does not use drugs.  Allergies:  Allergies  Allergen Reactions  . Clindamycin/Lincomycin Rash  . Nitrofurantoin Monohyd Macro Rash  . Silver Sulfadiazine Rash    Allergies as of 07/13/2020      Reactions   Clindamycin/lincomycin Rash   Nitrofurantoin Monohyd Macro Rash   Silver Sulfadiazine Rash      Medication List       Accurate as of July 13, 2020  3:16 PM. If you have any questions, ask your nurse or doctor.        ALPRAZolam 0.5 MG tablet Commonly known as: XANAX Take 0.5 mg by mouth daily as needed.   digoxin 0.125 MG tablet Commonly known as: LANOXIN TAKE 1 TABLET BY MOUTH DAILY   diltiazem 60 MG 12 hr capsule Commonly known as: CARDIZEM SR Take 60 mg by mouth in the morning and at bedtime. TAKE AT 12PM AND 8PM   dilTIAZem CD 120 MG 24 hr capsule Generic drug: diltiazem Take 120 mg by mouth daily. ONLY IN THE AM   Eliquis 5 MG Tabs tablet Generic drug: apixaban Take 5 mg by mouth 2 (two) times daily.   famotidine 20 MG tablet Commonly known as: PEPCID Take 1 tablet by mouth in the morning and at bedtime.   furosemide 20 MG tablet Commonly known as: LASIX Take 20 mg by mouth.   levothyroxine 100 MCG tablet Commonly known as: SYNTHROID Take 100 mcg by mouth every morning.   magnesium oxide 400 MG tablet Commonly known as: MAG-OX Take 400 mg by mouth daily.   metoprolol tartrate 25 MG  tablet Commonly known as: LOPRESSOR Take 1 tablet by mouth every 6 (six) hours as needed.   mupirocin ointment 2 % Commonly known as: BACTROBAN mupirocin 2 % topical ointment  APPLY TOPICALLY 2 (TWO) TIMES DAILY AS NEEDED.   rosuvastatin 5 MG tablet Commonly known as: CRESTOR rosuvastatin 5 mg tablet          Review of Systems  HYPERCALCEMIA: She has mild hyperparathyroidism with variable increase in calcium levels Previously had increased PTH level  also  Highest TSH has been 10.7 with her PCP   Lab Results  Component Value Date   PTH 76 (H)  11/24/2017   CALCIUM 10.2 06/29/2020    OSTEOPENIA: In 2019 she was found to have osteopenia as follows, this was her first bone density Has not been on any treatment  Bone density in 05/2018:   Lumbar spine L1-L4 Femoral neck (FN) 33% distal radius  T-score -1.6 RFN: -1.5 LFN: -2.1 -2.5  Change in BMD from previous DXA test (%) n/a n/a n/a    VITAMIN D deficiency:  She was found to have a low vitamin D level of 15 in 10/2017  She is taking the OTC vitamin D3, 2000 units daily as before, level has not been checked by PCP   Lab Results  Component Value Date   VD25OH 37.79 06/17/2019   VD25OH 33.12 05/31/2018   VD25OH 15.30 (L) 11/24/2017               Examination:    BP 118/68   Pulse 100   Ht 5\' 5"  (1.651 m)   Wt 167 lb (75.8 kg)   SpO2 97%   BMI 27.79 kg/m     Assessment:  HYPOTHYROIDISM, secondary to I-131 ablation of thyroid for hyperthyroidism  She has been on 100 mcg levothyroxine She does not think she has felt any different and no change in her mild chronic fatigue  We will recheck her TSH and decide on dosage   HYPERPARATHYROIDISM: Has had mild hypercalcemia which is longstanding Again asymptomatic and generally calcium level upper normal Has only osteopenia at the hip and borderline osteoporosis at the wrist  VITAMIN D deficiency: This is now being supplemented with 2000 units vitamin  D3 Needs follow-up levels  OSTEOPENIA: Last checked in 2019   PLAN:   Labs to be done today as above   Will need bone density rechecked and will be scheduled, if she is showing significant worsening may consider bisphosphonate treatment   Elayne Snare 07/13/2020, 3:16 PM     Note: This office note was prepared with Dragon voice recognition system technology. Any transcriptional errors that result from this process are unintentional.

## 2020-07-14 ENCOUNTER — Other Ambulatory Visit (INDEPENDENT_AMBULATORY_CARE_PROVIDER_SITE_OTHER): Payer: Medicare Other

## 2020-07-14 DIAGNOSIS — E890 Postprocedural hypothyroidism: Secondary | ICD-10-CM

## 2020-07-14 DIAGNOSIS — E559 Vitamin D deficiency, unspecified: Secondary | ICD-10-CM

## 2020-07-14 LAB — VITAMIN D 25 HYDROXY (VIT D DEFICIENCY, FRACTURES): VITD: 31.6 ng/mL (ref 30.00–100.00)

## 2020-07-14 LAB — TSH: TSH: 5.87 u[IU]/mL — ABNORMAL HIGH (ref 0.35–4.50)

## 2020-07-14 LAB — T4, FREE: Free T4: 1.05 ng/dL (ref 0.60–1.60)

## 2020-07-15 NOTE — Progress Notes (Signed)
Please call to let patient know that the thyroid is low, go up to 112ug qd, send new Rx and need appt. In 2 months, same day labs

## 2020-07-16 ENCOUNTER — Other Ambulatory Visit: Payer: Self-pay

## 2020-07-16 DIAGNOSIS — E890 Postprocedural hypothyroidism: Secondary | ICD-10-CM

## 2020-07-16 MED ORDER — LEVOTHYROXINE SODIUM 112 MCG PO TABS: 112.0000 ug | ORAL_TABLET | Freq: Every day | ORAL | 3 refills | Status: DC

## 2020-07-17 ENCOUNTER — Telehealth: Payer: Self-pay | Admitting: Cardiology

## 2020-07-17 NOTE — Telephone Encounter (Signed)
Spoke to patient just now and let her know that her PCP will need to adjust her oxygen for her if needed. Her O2 right now is at 95% which I explained was a good result. She verbalizes understanding and thanks me for the call back.

## 2020-07-17 NOTE — Telephone Encounter (Signed)
Pt c/o Shortness Of Breath: STAT if SOB developed within the last 24 hours or pt is noticeably SOB on the phone  1. Are you currently SOB (can you hear that pt is SOB on the phone)? No   2. How long have you been experiencing SOB? About 2 months per patient   3. Are you SOB when sitting or when up moving around? Patient states SOB is only in the morning when she wakes up and she assumes her oxygen may need to be adjusted  4. Are you currently experiencing any other symptoms? No

## 2020-09-01 ENCOUNTER — Telehealth: Payer: Self-pay | Admitting: Cardiology

## 2020-09-01 NOTE — Telephone Encounter (Signed)
° ° °  Pt c/o medication issue:  1. Name of Medication:   diltiazem (CARDIZEM SR) 60 MG 12 hr capsule    diltiazem (DILTIAZEM CD) 120 MG 24 hr capsule    2. How are you currently taking this medication (dosage and times per day)?   3. Are you having a reaction (difficulty breathing--STAT)?   4. What is your medication issue? Pt said she's having leg swelling after taking these medications.

## 2020-09-01 NOTE — Telephone Encounter (Signed)
Best to stop diltiazem

## 2020-09-01 NOTE — Telephone Encounter (Signed)
Spoke to the patient just now and let her know Dr. Joya Gaskins recommendations. She verbalizes understanding and thanks me for the call back.

## 2020-09-09 ENCOUNTER — Other Ambulatory Visit: Payer: Self-pay

## 2020-09-09 ENCOUNTER — Ambulatory Visit (INDEPENDENT_AMBULATORY_CARE_PROVIDER_SITE_OTHER)
Admission: RE | Admit: 2020-09-09 | Discharge: 2020-09-09 | Disposition: A | Discharge status: Home / Self Care | Payer: Medicare Other | Source: Ambulatory Visit | Attending: Endocrinology | Admitting: Endocrinology

## 2020-09-09 DIAGNOSIS — E210 Primary hyperparathyroidism: Secondary | ICD-10-CM

## 2020-09-09 DIAGNOSIS — M8580 Other specified disorders of bone density and structure, unspecified site: Secondary | ICD-10-CM

## 2020-09-16 ENCOUNTER — Ambulatory Visit (INDEPENDENT_AMBULATORY_CARE_PROVIDER_SITE_OTHER): Payer: Medicare Other | Admitting: Endocrinology

## 2020-09-16 ENCOUNTER — Other Ambulatory Visit: Payer: Self-pay

## 2020-09-16 ENCOUNTER — Encounter: Payer: Self-pay | Admitting: Endocrinology

## 2020-09-16 VITALS — BP 136/78 | HR 96 | Ht 65.0 in | Wt 164.4 lb

## 2020-09-16 DIAGNOSIS — E210 Primary hyperparathyroidism: Secondary | ICD-10-CM

## 2020-09-16 DIAGNOSIS — E890 Postprocedural hypothyroidism: Secondary | ICD-10-CM | POA: Diagnosis not present

## 2020-09-16 DIAGNOSIS — M8580 Other specified disorders of bone density and structure, unspecified site: Secondary | ICD-10-CM | POA: Diagnosis not present

## 2020-09-16 DIAGNOSIS — E559 Vitamin D deficiency, unspecified: Secondary | ICD-10-CM | POA: Diagnosis not present

## 2020-09-16 DIAGNOSIS — R400 Somnolence: Secondary | ICD-10-CM

## 2020-09-16 LAB — BASIC METABOLIC PANEL
BUN: 13 mg/dL (ref 6–23)
CO2: 30 mEq/L (ref 19–32)
Calcium: 10.3 mg/dL (ref 8.4–10.5)
Chloride: 101 mEq/L (ref 96–112)
Creatinine, Ser: 0.96 mg/dL (ref 0.40–1.20)
GFR: 57.49 mL/min — ABNORMAL LOW (ref >60.00)
Glucose, Bld: 102 mg/dL — ABNORMAL HIGH (ref 70–99)
Potassium: 4.1 mEq/L (ref 3.5–5.1)
Sodium: 137 mEq/L (ref 135–145)

## 2020-09-16 LAB — TSH: TSH: 5.29 u[IU]/mL — ABNORMAL HIGH (ref 0.35–4.50)

## 2020-09-16 LAB — T4, FREE: Free T4: 1.15 ng/dL (ref 0.60–1.60)

## 2020-09-16 LAB — T3, FREE: T3, Free: 2.5 pg/mL (ref 2.3–4.2)

## 2020-09-16 NOTE — Patient Instructions (Signed)
Take 2x Vitamin D3

## 2020-09-16 NOTE — Progress Notes (Signed)
Patient ID: April Padilla, female   DOB: 01-11-1946, 74 y.o.   MRN: [id number]               Reason for Appointment: Endocrinology follow-up visit    History of Present Illness:   Problem 1:  Hypothyroidism was first diagnosed in the 1990s after treatment of hyperthyroidism with radioactive iodine Details are not available She thinks that for several years she had been stable on 88 g of levothyroxine  In 07/2017 she had a routine TSH test and this was found to be high at 7.9 Subsequently her levothyroxine doses have been increased and she has been taking 112 mcg since late 2018  Subsequently her dose was reduced to 100 mcg levothyroxine by her PCP With this her TSH was normal in 06/2019 but her TSH was high in 9/21 and she is now taking [place]  She still has some fatigue related to atrial fibrillation Also is having some daytime somnolence now She tends to get hot and cold easily depending on the weather Her weight is slightly lower  She is quite consistent with taking her levothyroxine in the morning at least an hour before breakfast  Thyroid level needs to be checked today         Patient's weight history is as follows:  Wt Readings from Last 3 Encounters:  09/16/20 164 lb 6.4 oz (74.6 kg)  07/13/20 167 lb (75.8 kg)  06/29/20 169 lb (76.7 kg)    Thyroid function results have been as follows:  Lab Results  Component Value Date   TSH 5.87 (H) 07/14/2020   TSH 4.39 06/17/2019   TSH 2.36 05/31/2018   TSH 0.93 11/24/2017   FREET4 1.05 07/14/2020   FREET4 1.06 06/17/2019   FREET4 1.10 05/31/2018   FREET4 1.23 11/24/2017    HYPERCALCEMIA: See review of systems    Past Medical History:  Diagnosis Date  . 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  . Chest discomfort 12/23/2018  . Chronic atrial fibrillation ([place]) 01/26/2016   Last Assessment & Plan:  Has had ablation through [facility] , her BP here was stable , discussed her  current meds and the S/E , she is off of the lasix now and will see her CMP for her as well as CK today for her muscle cramps she has had long before her ablation with possible pain from the statin  . Chronic fatigue 07/25/2016  . Chronic idiopathic constipation 01/26/2016  . Dysrhythmia    on toprol for fast heart beat  . Encounter for care of pacemaker 12/19/2018  . Essential (primary) hypertension 01/26/2016  . Family history of colon cancer in mother 07/17/2017  . GERD (gastroesophageal reflux disease) 07/17/2017  . H/O hiatal hernia   . High risk medication use 01/26/2016  . History of adenomatous polyp of colon 07/17/2017  . Hypercalcemia 01/26/2016  . Hypothyroidism    pt. states she took radioactive pill and has no thyroid  . Irregular heart rate 07/06/2018  . Left shoulder pain 07/06/2018  . Long term current use of anticoagulant 07/06/2018  . Mixed hyperlipidemia 01/26/2016  . Myalgia 05/18/2016   Last Assessment & Plan:  Formatting of this note might be different from the original. Will update her CMP for her and ensure that her potassium and her magnesium are stable for her with the recent myalgias and fatigue post procedure for her  . On continuous oral anticoagulation 08/09/2016  . On dofetilide therapy 01/29/2018  . Pain  of left hip joint 11/22/2018  . 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 Presence of left artificial knee joint 01/22/2015  . Primary generalized (osteo)arthritis 01/26/2016  . Primary hyperparathyroidism ([place]) 11/26/2017  . Primary osteoarthritis involving multiple joints 01/26/2016  . PSVT (paroxysmal supraventricular tachycardia) ([place]) 01/26/2016  . Recurrent urticaria 01/26/2016  . S/P TKR (total knee replacement) 12/27/2012  . Shortness of breath    with exertion  . Sinobronchitis 09/07/2018  . Situational anxiety 09/07/2018  . Thyroid function test abnormal 08/19/2016  . Vitamin D deficiency 11/26/2017    Past Surgical History:  Procedure Laterality Date  .  BUNIONECTOMY  yrs. ago   left foot  . FOOT SURGERY  2009   took knot out  and put in screws  . KNEE ARTHROSCOPY W/ MENISCAL REPAIR  2009   left  . TOTAL KNEE ARTHROPLASTY  01/16/2012   Procedure: TOTAL KNEE ARTHROPLASTY;  Surgeon: Rudean Haskell, MD;  Location: [place];  Service: Orthopedics;  Laterality: Left;    Family History  Problem Relation Age of Onset  . Cancer Mother   . Thyroid disease Mother   . Stroke Father   . Anesthesia problems Neg Hx   . Hypotension Neg Hx   . Malignant hyperthermia Neg Hx   . Pseudochol deficiency Neg Hx     Social History:  reports that she has never smoked. She has never used smokeless tobacco. She reports that she does not drink alcohol and does not use drugs.  Allergies:  Allergies  Allergen Reactions  . Clindamycin/Lincomycin Rash  . Nitrofurantoin Monohyd Macro Rash  . Silver Sulfadiazine Rash    Allergies as of 09/16/2020      Reactions   Clindamycin/lincomycin Rash   Nitrofurantoin Monohyd Macro Rash   Silver Sulfadiazine Rash      Medication List       Accurate as of September 16, 2020  3:30 PM. If you have any questions, ask your nurse or doctor.        ALPRAZolam 0.5 MG tablet Commonly known as: XANAX Take 0.5 mg by mouth daily as needed.   cholecalciferol 25 MCG (1000 UNIT) tablet Commonly known as: VITAMIN D3 Take 2,000 Units by mouth daily.   digoxin 0.125 MG tablet Commonly known as: LANOXIN TAKE 1 TABLET BY MOUTH DAILY   diltiazem 60 MG 12 hr capsule Commonly known as: CARDIZEM SR Take 60 mg by mouth in the morning and at bedtime. TAKE AT 12PM AND 8PM   dilTIAZem CD 120 MG 24 hr capsule Generic drug: diltiazem Take 120 mg by mouth daily. ONLY IN THE AM Takes a total of 240 mg daily   Eliquis 5 MG Tabs tablet Generic drug: apixaban Take 5 mg by mouth 2 (two) times daily.   famotidine 20 MG tablet Commonly known as: PEPCID Take 1 tablet by mouth in the morning and at bedtime.   furosemide 20 MG  tablet Commonly known as: LASIX Take 20 mg by mouth.   levothyroxine 112 MCG tablet Commonly known as: SYNTHROID Take 1 tablet (112 mcg total) by mouth daily.   magnesium oxide 400 MG tablet Commonly known as: MAG-OX Take 400 mg by mouth daily.   metoprolol tartrate 25 MG tablet Commonly known as: LOPRESSOR Take 1 tablet by mouth every 6 (six) hours as needed.   mupirocin ointment 2 % Commonly known as: BACTROBAN mupirocin 2 % topical ointment  APPLY TOPICALLY 2 (TWO) TIMES DAILY AS NEEDED.   rosuvastatin 5 MG tablet Commonly known as:  CRESTOR rosuvastatin 5 mg tablet          Review of Systems  HYPERCALCEMIA: She has mild hyperparathyroidism with variable increase in calcium levels Previously had increased PTH level  also  Highest TSH has been 10.7 with her PCP   Lab Results  Component Value Date   PTH 76 (H) 11/24/2017   CALCIUM 10.2 06/29/2020    OSTEOPENIA: In 2019 she was found to have osteopenia as follows, this was her first bone density  Bone density in 08/2020 is stable to improved, has not been on treatment   Lumbar spine L1-L4 Femoral neck (FN) 33% distal radius  T-score   -1.3 RFN: -1.6 LFN: -1.8 -2.5  Change in BMD from previous DXA test (%) Up 3.5%* Up 2.0% Down 0.5%  (*) statistically significant  In 05/2018:   Lumbar spine L1-L4 Femoral neck (FN) 33% distal radius  T-score -1.6 RFN: -1.5 LFN: -2.1 -2.5   VITAMIN D deficiency:  She was found to have a low vitamin D level of 15 in 10/2017  She is taking the OTC vitamin D3, 2000 units daily although she is unclear about her dose today   Lab Results  Component Value Date   VD25OH 31.60 07/14/2020   VD25OH 37.79 06/17/2019   VD25OH 33.12 05/31/2018               Examination:    BP 136/78   Pulse 96   Ht 5\' 5"  (1.651 m)   Wt 164 lb 6.4 oz (74.6 kg)   SpO2 93%   BMI 27.36 kg/m     Assessment:  HYPOTHYROIDISM, secondary to I-131 ablation of thyroid for  hyperthyroidism  She has been on 112 mcg levothyroxine for the last 2 months She has had excessive somnolence which she did not have on her last visit as much even when her TSH was slightly high  We will recheck her TSH and decide on dosage  Daytime somnolence and history of snoring: She will discuss repeating her sleep study again with PCP  HYPERPARATHYROIDISM: Has had mild hypercalcemia with more recent calcium only upper normal and will recheck today  VITAMIN D deficiency: Her vitamin D level has been low normal  OSTEOPENIA: Stable to improved, indicating no need for treatment or intervention for her hyperparathyroidism   PLAN:   Labs to be done today as above   She will need to double up on her vitamin D since she has osteopenia and her levels need to be relatively higher  Patient Instructions  Take 2x Vitamin D3      Elayne Snare 09/16/2020, 3:30 PM     Note: This office note was prepared with Dragon voice recognition system technology. Any transcriptional errors that result from this process are unintentional.  Addendum: TSH still high, she will go up to 125 mcg levothyroxine and follow-up in 3 months

## 2020-09-17 ENCOUNTER — Other Ambulatory Visit: Payer: Self-pay | Admitting: *Deleted

## 2020-09-17 ENCOUNTER — Telehealth: Payer: Self-pay | Admitting: Endocrinology

## 2020-09-17 DIAGNOSIS — E890 Postprocedural hypothyroidism: Secondary | ICD-10-CM

## 2020-09-17 MED ORDER — LEVOTHYROXINE SODIUM 125 MCG PO TABS: 125.0000 ug | ORAL_TABLET | Freq: Every day | ORAL | 1 refills | Status: DC

## 2020-09-17 NOTE — Telephone Encounter (Signed)
Patient returned call about her lab results. I gave her Dr Jodelle Green message, sehe expressed understanding and acceptance of information provided. Advised that levothyroxine will be increased from 112 mcg to 125 mcg .  Patient reqeusted that we send new RX to [id number] [address], [place] [address] [place] [address] Phone:  [phone] Fax:  [phone]   DEA #:  [id number]

## 2020-10-20 ENCOUNTER — Telehealth: Payer: Self-pay | Admitting: Cardiology

## 2020-10-20 DIAGNOSIS — I119 Hypertensive heart disease without heart failure: Secondary | ICD-10-CM

## 2020-10-20 NOTE — Telephone Encounter (Signed)
New Message:      Please call Barnett Applebaum, concerning this pt please.

## 2020-10-20 NOTE — Telephone Encounter (Signed)
Patient called and said her Afib Specialist at [facility] , Dr. Norm Salt, ordered an Echocardiogram for the patient to have done at Dr. Joya Gaskins office. Dr. Glennon Mac wanted her to have the test done at [facility] but she asked that it be done in [place] due to the distance.  The patient wanted to know if Dr. Bettina Gavia had gotten the orders from Dr. Glennon Mac yet or not. Please advise   Dr. Glennon Mac can be reached by phone at: [phone]

## 2020-10-20 NOTE — Telephone Encounter (Signed)
The fax number was given to the patient as we have not received anything form Dr. Glennon Mac.

## 2020-10-27 DIAGNOSIS — G8929 Other chronic pain: Secondary | ICD-10-CM | POA: Insufficient documentation

## 2020-10-27 HISTORY — DX: Other chronic pain: G89.29

## 2020-11-02 NOTE — Addendum Note (Signed)
Addended by: Delorse Limber I on: 11/02/2020 11:20 AM   Modules accepted: Orders

## 2020-11-02 NOTE — Telephone Encounter (Signed)
Patient called to schedule echocardiogram. I made her aware that we do not currently have the order in for the echocardiogram. Do we have an update? Please advise.

## 2020-11-02 NOTE — Telephone Encounter (Signed)
Yes but I want to do it in our office

## 2020-11-11 ENCOUNTER — Other Ambulatory Visit: Payer: Self-pay

## 2020-11-11 ENCOUNTER — Ambulatory Visit (HOSPITAL_COMMUNITY): Payer: Medicare HMO | Attending: Cardiology

## 2020-11-11 DIAGNOSIS — I119 Hypertensive heart disease without heart failure: Secondary | ICD-10-CM

## 2020-11-11 LAB — ECHOCARDIOGRAM COMPLETE
Area-P 1/2: 3.62 cm2
S' Lateral: 2.1 cm

## 2020-11-12 ENCOUNTER — Telehealth: Payer: Self-pay

## 2020-11-12 LAB — TSH: TSH: 7.6 — AB (ref 0.41–5.90)

## 2020-11-12 LAB — LIPID PANEL
Cholesterol: 230 — AB (ref 0–200)
HDL: 95 — AB (ref 35–70)
LDL Cholesterol: 103
Triglycerides: 88 (ref 40–160)

## 2020-11-12 NOTE — Telephone Encounter (Signed)
Spoke with patient regarding results and recommendation.  Patient verbalizes understanding and is agreeable to plan of care. Advised patient to call back with any issues or concerns.  

## 2020-11-12 NOTE — Telephone Encounter (Signed)
-----   Message from Richardo Priest, MD sent at 11/11/2020  2:49 PM EST ----- This was ordered by EP at [facility]  Please route a copy Dr. Norm Salt, EP [facility]  Overall good report heart muscle function remains normal.

## 2020-11-12 NOTE — Telephone Encounter (Signed)
-----   Message from Brian J Munley, MD sent at 11/11/2020  2:49 PM EST ----- This was ordered by EP at [facility]  Please route a copy Dr. Kevin Jackson, EP [facility]  Overall good report heart muscle function remains normal. 

## 2020-11-12 NOTE — Telephone Encounter (Signed)
Left a message to return my call.

## 2020-11-19 ENCOUNTER — Telehealth: Payer: Self-pay | Admitting: *Deleted

## 2020-11-19 NOTE — Telephone Encounter (Signed)
Notes received sent to referral for scheduling [facility] [phone]

## 2020-11-20 ENCOUNTER — Telehealth: Payer: Self-pay | Admitting: Endocrinology

## 2020-11-20 NOTE — Telephone Encounter (Signed)
Patient called to advise that she is going to be faxing her lab results from a lab draw at [facility].  Is questions her TSH level and medication dosages.  Call back # is [phone] (home)

## 2020-11-24 NOTE — Telephone Encounter (Signed)
Patient calling back to see if we ever got the fax with her TSH results? I told her I did not see if we had gotten them or not so she said she would resend them today.

## 2020-11-25 NOTE — Telephone Encounter (Signed)
Yes we did get it and it's on Dr. Ronnie Derby desk.

## 2020-12-07 ENCOUNTER — Telehealth: Payer: Self-pay | Admitting: Cardiology

## 2020-12-07 ENCOUNTER — Telehealth: Payer: Self-pay | Admitting: Endocrinology

## 2020-12-07 DIAGNOSIS — I482 Chronic atrial fibrillation, unspecified: Secondary | ICD-10-CM

## 2020-12-07 NOTE — Telephone Encounter (Signed)
Patient wanted to check on a referral to Dr. Rayann Heman.   The patient has Afib and was referred to a Doctor at [facility]. The Doctor at [facility] said they referred the patient to Dr. Rayann Heman but she hadn't heard any information yet. Please follow up

## 2020-12-07 NOTE — Telephone Encounter (Signed)
Pt requests nurse give her a call regarding her medications. Pt says she has been taking her medication like the instructions say but is concerned it's creating some side effects. Pt says she is just worrying a little because she has lost some weight and wonders if that is due to the medication?  Ph# [phone]

## 2020-12-08 NOTE — Telephone Encounter (Signed)
Left message on patients voicemail to please return our call.   

## 2020-12-08 NOTE — Telephone Encounter (Signed)
Patient returning call.

## 2020-12-08 NOTE — Addendum Note (Signed)
Addended by: Resa Miner I on: 12/08/2020 01:40 PM   Modules accepted: Orders

## 2020-12-08 NOTE — Telephone Encounter (Signed)
She will likely need a higher dose of levothyroxine but will need to have her check with PCP about her palpitations first

## 2020-12-08 NOTE — Telephone Encounter (Signed)
Spoke to the patient just now and let her know that Dr. Rayann Heman is not in our office. She states that she knows this but is requesting that the referral be placed. I spoke to Dr. Bettina Gavia just now and he said that this was fine.   The referral was placed and the patient is aware.

## 2020-12-15 NOTE — Telephone Encounter (Signed)
Noted, patient is aware and said she's in a-fib so she's always having palpitations, she was just wondering if her tsh level would contribute to them?  She has an appointment to see you next week and will discuss treatment options with you then.

## 2020-12-15 NOTE — Telephone Encounter (Signed)
Palpitations are not related to her high TSH

## 2020-12-21 ENCOUNTER — Encounter: Payer: Self-pay | Admitting: Internal Medicine

## 2020-12-21 ENCOUNTER — Ambulatory Visit (INDEPENDENT_AMBULATORY_CARE_PROVIDER_SITE_OTHER): Payer: Medicare Other | Admitting: Internal Medicine

## 2020-12-21 ENCOUNTER — Encounter (INDEPENDENT_AMBULATORY_CARE_PROVIDER_SITE_OTHER): Payer: Self-pay

## 2020-12-21 ENCOUNTER — Other Ambulatory Visit: Payer: Self-pay

## 2020-12-21 VITALS — BP 126/74 | HR 79 | Ht 65.0 in | Wt 160.2 lb

## 2020-12-21 DIAGNOSIS — D6869 Other thrombophilia: Secondary | ICD-10-CM

## 2020-12-21 DIAGNOSIS — I482 Chronic atrial fibrillation, unspecified: Secondary | ICD-10-CM

## 2020-12-21 DIAGNOSIS — I480 Paroxysmal atrial fibrillation: Secondary | ICD-10-CM

## 2020-12-21 DIAGNOSIS — R001 Bradycardia, unspecified: Secondary | ICD-10-CM

## 2020-12-21 NOTE — Progress Notes (Signed)
Electrophysiology Office Note   Date:  12/21/2020   ID:  Shakerra, Red [birth year], MRN [id number]  PCP:  [name], MD  Cardiologist:  Dr Bettina Gavia Primary Electrophysiologist: Norm Salt, MD  CC: afib   History of Present Illness: April Padilla is a 75 y.o. female who presents today for electrophysiology evaluation.   She is referred by Dr Bettina Gavia for EP consultation regarding afib. The patient has a long history of afib.  She underwent cryo ablation by Dr Elonda Husky in 2016.  She did well initially following ablation but has developed recurrent events.  She has failed medical therapy with multaq, flecainide, propafenone, and ranexa.  She underwent repeat PVI 2017.  She was noted to have isolated PVs at the time.  She underwent posterior wall isolation (per Dr Eduard Roux notes).  She was found to have atrial tachycardias from both R and L atria.  She was subsequently started on tikosyn.  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  She subsequently had tikosyn discontinue due to refractory afib. She has been followed by Dr Norm Salt at [facility] since 2017.  Today, she denies symptoms of palpitations, chest pain, shortness of breath, orthopnea, PND, lower extremity edema, claudication, dizziness, presyncope, syncope, bleeding, or neurologic sequela. The patient is tolerating medications without difficulties and is otherwise without complaint today.    Past Medical History:  Diagnosis Date  . 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  . Chest discomfort 12/23/2018  . Chronic atrial fibrillation ([place]) 01/26/2016   Last Assessment & Plan:  Has had ablation through [facility] , her BP here was stable , discussed her current meds and the S/E , she is off of the lasix now and will see her CMP for her as well as CK today for her muscle cramps she has had long before her ablation with possible pain from the statin  . Chronic fatigue  07/25/2016  . Chronic idiopathic constipation 01/26/2016  . Dysrhythmia    on toprol for fast heart beat  . Encounter for care of pacemaker 12/19/2018  . Essential (primary) hypertension 01/26/2016  . Family history of colon cancer in mother 07/17/2017  . GERD (gastroesophageal reflux disease) 07/17/2017  . H/O hiatal hernia   . High risk medication use 01/26/2016  . History of adenomatous polyp of colon 07/17/2017  . Hypercalcemia 01/26/2016  . Hypothyroidism    pt. states she took radioactive pill and has no thyroid  . Irregular heart rate 07/06/2018  . Left shoulder pain 07/06/2018  . Long term current use of anticoagulant 07/06/2018  . Mixed hyperlipidemia 01/26/2016  . Myalgia 05/18/2016   Last Assessment & Plan:  Formatting of this note might be different from the original. Will update her CMP for her and ensure that her potassium and her magnesium are stable for her with the recent myalgias and fatigue post procedure for her  . On continuous oral anticoagulation 08/09/2016  . On dofetilide therapy 01/29/2018  . Pain of left hip joint 11/22/2018  . 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 Presence of left artificial knee joint 01/22/2015  . Primary generalized (osteo)arthritis 01/26/2016  . Primary hyperparathyroidism ([place]) 11/26/2017  . Primary osteoarthritis involving multiple joints 01/26/2016  . PSVT (paroxysmal supraventricular tachycardia) ([place]) 01/26/2016  . Recurrent urticaria 01/26/2016  . S/P TKR (total knee replacement) 12/27/2012  . Shortness of breath    with exertion  . Sinobronchitis 09/07/2018  . Situational anxiety 09/07/2018  . Thyroid function test  abnormal 08/19/2016  . Vitamin D deficiency 11/26/2017   Past Surgical History:  Procedure Laterality Date  . BUNIONECTOMY  yrs. ago   left foot  . FOOT SURGERY  2009   took knot out  and put in screws  . KNEE ARTHROSCOPY W/ MENISCAL REPAIR  2009   left  . TOTAL KNEE ARTHROPLASTY  01/16/2012   Procedure: TOTAL KNEE ARTHROPLASTY;   Surgeon: Rudean Haskell, MD;  Location: [place];  Service: Orthopedics;  Laterality: Left;     Current Outpatient Medications  Medication Sig Dispense Refill  . ALPRAZolam (XANAX) 0.5 MG tablet Take 0.5 mg by mouth daily as needed.     Marland Kitchen apixaban (ELIQUIS) 5 MG TABS tablet Take 5 mg by mouth 2 (two) times daily.     . cholecalciferol (VITAMIN D3) 25 MCG (1000 UNIT) tablet Take 2,000 Units by mouth daily.    Marland Kitchen diltiazem (CARDIZEM CD) 120 MG 24 hr capsule Take 120 mg by mouth daily. ONLY IN THE AM Takes a total of 240 mg daily    . diltiazem (CARDIZEM SR) 60 MG 12 hr capsule Take 60 mg by mouth in the morning and at bedtime. TAKE AT 12PM AND 8PM    . famotidine (PEPCID) 20 MG tablet Take 1 tablet by mouth in the morning and at bedtime.    . furosemide (LASIX) 20 MG tablet Take 20 mg by mouth.    . levothyroxine (SYNTHROID) 125 MCG tablet Take 1 tablet (125 mcg total) by mouth daily. 90 tablet 1  . magnesium oxide (MAG-OX) 400 MG tablet Take 400 mg by mouth daily.    . metoprolol tartrate (LOPRESSOR) 25 MG tablet Take 1 tablet by mouth every 6 (six) hours as needed.    . mupirocin ointment (BACTROBAN) 2 % mupirocin 2 % topical ointment  APPLY TOPICALLY 2 (TWO) TIMES DAILY AS NEEDED. 22 g 0  . rosuvastatin (CRESTOR) 5 MG tablet rosuvastatin 5 mg tablet     No current facility-administered medications for this visit.    Allergies:   Clindamycin/lincomycin, Nitrofurantoin monohyd macro, and Silver sulfadiazine   Social History:  The patient  reports that she has never smoked. She has never used smokeless tobacco. She reports that she does not drink alcohol and does not use drugs.   Family History:  The patient's  family history includes Cancer in her mother; Stroke in her father; Thyroid disease in her mother.    ROS:  Please see the history of present illness.   All other systems are personally reviewed and negative.    PHYSICAL EXAM: VS:  BP 126/74   Pulse 79   Ht 5\' 5"  (1.651 m)    Wt 160 lb 3.2 oz (72.7 kg)   SpO2 97%   BMI 26.66 kg/m  , BMI Body mass index is 26.66 kg/m. GEN: Well nourished, well developed, in no acute distress HEENT: normal Neck: no JVD, carotid bruits, or masses Cardiac: iRRR; no murmurs, rubs, or gallops,no edema  Respiratory:  clear to auscultation bilaterally, normal work of breathing GI: soft, nontender, nondistended, + BS MS: no deformity or atrophy Skin: warm and dry  Neuro:  Strength and sensation are intact Psych: euthymic mood, full affect  EKG:  EKG is ordered today. The ekg ordered today is personally reviewed and shows rate controlled afib   Recent Labs: 06/29/2020: Hemoglobin 12.6; Platelets 249 09/16/2020: BUN 13; Creatinine, Ser 0.96; Potassium 4.1; Sodium 137 11/12/2020: TSH 7.60  personally reviewed   Lipid Panel  Component Value Date/Time   CHOL 230 (A) 11/12/2020 0000   TRIG 88 11/12/2020 0000   HDL 95 (A) 11/12/2020 0000   LDLCALC 103 11/12/2020 0000   personally reviewed   Wt Readings from Last 3 Encounters:  12/21/20 160 lb 3.2 oz (72.7 kg)  09/16/20 164 lb 6.4 oz (74.6 kg)  07/13/20 167 lb (75.8 kg)      Other studies personally reviewed: Additional studies/ records that were reviewed today include: Dr Eduard Roux notes, prior echo  Review of the above records today demonstrates: echo 11/11/20- EF 65%, moderate LA enlargement, severe RA enlargement,    ASSESSMENT AND PLAN:  1.  Persistent afib The patient has symptomatic, recurrent persistent atrial fibrillation. she has failed medical therapy with multiple drugs as above. She is s/p ablation x 2.  On second procedure, PVs were quiescent and posterior wall isolation was performed.  She has rate controlled afib and preserved EF.   Her options are very limited.  Chads2vasc score is at least 4.  she is anticoagulated with eliquis .  We discussed amiodarone vs rate control at length.  She is clear that she would like to consider amiodarone. She is  currently rate controlled No changes are therefore required at this time.   2. Symptomatic bradycardia S/p Micra I will follow remotes going forward  Follow-up:  Dr Bettina Gavia in 6 months Dr Curt Bears in [place] in a year I will see as needed  Current medicines are reviewed at length with the patient today.   The patient does not have concerns regarding her medicines.  The following changes were made today:  none  Labs/ tests ordered today include:  No orders of the defined types were placed in this encounter.    Army Fossa, MD  12/21/2020 9:42 AM     [address] [place] [address] [phone] (office) [phone] (fax)

## 2020-12-21 NOTE — Patient Instructions (Addendum)
Medication Instructions:  Your physician recommends that you continue on your current medications as directed. Please refer to the Current Medication list given to you today.  Labwork: None ordered.  Testing/Procedures: None ordered.  Follow-Up: Your physician wants you to follow-up in: Dr. Bettina Gavia in 6 months and Dr. Curt Bears in 12 months at the [facility] location.   Any Other Special Instructions Will Be Listed Below (If Applicable).  If you need a refill on your cardiac medications before your next appointment, please call your pharmacy.

## 2020-12-22 ENCOUNTER — Encounter: Payer: Self-pay | Admitting: Endocrinology

## 2020-12-22 ENCOUNTER — Ambulatory Visit (INDEPENDENT_AMBULATORY_CARE_PROVIDER_SITE_OTHER): Payer: Medicare Other | Admitting: Endocrinology

## 2020-12-22 VITALS — BP 132/78 | HR 89 | Ht 65.0 in | Wt 159.4 lb

## 2020-12-22 DIAGNOSIS — E890 Postprocedural hypothyroidism: Secondary | ICD-10-CM

## 2020-12-22 LAB — TSH: TSH: 1.64 u[IU]/mL (ref 0.35–4.50)

## 2020-12-22 NOTE — Progress Notes (Signed)
Patient ID: April Padilla, female   DOB: 04/07/1946, 75 y.o.   MRN: [id number]               Reason for Appointment: Endocrinology follow-up visit    History of Present Illness:   Problem 1:  Hypothyroidism was first diagnosed in the 1990s after treatment of hyperthyroidism with radioactive iodine Details are not available She thinks that for several years she had been stable on 88 g of levothyroxine  In 07/2017 she had a routine TSH test and this was found to be high at 7.9 Subsequently her levothyroxine doses have been increased and she has been taking 112 mcg since late 2018  Subsequently her dose was reduced to 100 mcg levothyroxine by her PCP With this her TSH was normal in 06/2019 but her TSH has trended higher  She is now taking 125 mcg since 11/21 when her TSH was 5.3  She complains of excessive fatigue which is chronically present  Her weight is again lower  She is quite consistent with taking her levothyroxine in the morning an hour before breakfast, however she forgot the tablet last Sunday even though she has a pillbox  Thyroid level done last month shows TSH of 7.6 To be checked today         Patient's weight history is as follows:  Wt Readings from Last 3 Encounters:  12/22/20 159 lb 6.4 oz (72.3 kg)  12/21/20 160 lb 3.2 oz (72.7 kg)  09/16/20 164 lb 6.4 oz (74.6 kg)    Thyroid function results have been as follows:  Lab Results  Component Value Date   TSH 7.60 (A) 11/12/2020   TSH 5.29 (H) 09/16/2020   TSH 5.87 (H) 07/14/2020   TSH 4.39 06/17/2019   FREET4 1.15 09/16/2020   FREET4 1.05 07/14/2020   FREET4 1.06 06/17/2019   FREET4 1.10 05/31/2018   T3FREE 2.5 09/16/2020    HYPERCALCEMIA: See review of systems    Past Medical History:  Diagnosis Date  . 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  . Chest discomfort 12/23/2018  . Chronic atrial fibrillation ([place]) 01/26/2016   Last Assessment & Plan:  Has  had ablation through [facility] , her BP here was stable , discussed her current meds and the S/E , she is off of the lasix now and will see her CMP for her as well as CK today for her muscle cramps she has had long before her ablation with possible pain from the statin  . Chronic fatigue 07/25/2016  . Chronic idiopathic constipation 01/26/2016  . Dysrhythmia    on toprol for fast heart beat  . Encounter for care of pacemaker 12/19/2018  . Essential (primary) hypertension 01/26/2016  . Family history of colon cancer in mother 07/17/2017  . GERD (gastroesophageal reflux disease) 07/17/2017  . H/O hiatal hernia   . High risk medication use 01/26/2016  . History of adenomatous polyp of colon 07/17/2017  . Hypercalcemia 01/26/2016  . Hypothyroidism    pt. states she took radioactive pill and has no thyroid  . Irregular heart rate 07/06/2018  . Left shoulder pain 07/06/2018  . Long term current use of anticoagulant 07/06/2018  . Mixed hyperlipidemia 01/26/2016  . Myalgia 05/18/2016   Last Assessment & Plan:  Formatting of this note might be different from the original. Will update her CMP for her and ensure that her potassium and her magnesium are stable for her with the recent myalgias and fatigue post procedure for  her  . On continuous oral anticoagulation 08/09/2016  . On dofetilide therapy 01/29/2018  . Pain of left hip joint 11/22/2018  . 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 Presence of left artificial knee joint 01/22/2015  . Primary generalized (osteo)arthritis 01/26/2016  . Primary hyperparathyroidism ([place]) 11/26/2017  . Primary osteoarthritis involving multiple joints 01/26/2016  . PSVT (paroxysmal supraventricular tachycardia) ([place]) 01/26/2016  . Recurrent urticaria 01/26/2016  . S/P TKR (total knee replacement) 12/27/2012  . Shortness of breath    with exertion  . Sinobronchitis 09/07/2018  . Situational anxiety 09/07/2018  . Thyroid function test abnormal 08/19/2016  . Vitamin D deficiency 11/26/2017     Past Surgical History:  Procedure Laterality Date  . BUNIONECTOMY  yrs. ago   left foot  . FOOT SURGERY  2009   took knot out  and put in screws  . KNEE ARTHROSCOPY W/ MENISCAL REPAIR  2009   left  . TOTAL KNEE ARTHROPLASTY  01/16/2012   Procedure: TOTAL KNEE ARTHROPLASTY;  Surgeon: Rudean Haskell, MD;  Location: [facility];  Service: Orthopedics;  Laterality: Left;    Family History  Problem Relation Age of Onset  . Cancer Mother   . Thyroid disease Mother   . Stroke Father   . Anesthesia problems Neg Hx   . Hypotension Neg Hx   . Malignant hyperthermia Neg Hx   . Pseudochol deficiency Neg Hx     Social History:  reports that she has never smoked. She has never used smokeless tobacco. She reports that she does not drink alcohol and does not use drugs.  Allergies:  Allergies  Allergen Reactions  . Clindamycin/Lincomycin Rash  . Nitrofurantoin Monohyd Macro Rash  . Silver Sulfadiazine Rash    Allergies as of 12/22/2020      Reactions   Clindamycin/lincomycin Rash   Nitrofurantoin Monohyd Macro Rash   Silver Sulfadiazine Rash      Medication List       Accurate as of December 22, 2020  9:56 AM. If you have any questions, ask your nurse or doctor.        ALPRAZolam 0.5 MG tablet Commonly known as: XANAX Take 0.5 mg by mouth daily as needed.   apixaban 5 MG Tabs tablet Commonly known as: ELIQUIS Take 5 mg by mouth 2 (two) times daily.   cholecalciferol 25 MCG (1000 UNIT) tablet Commonly known as: VITAMIN D3 Take 2,000 Units by mouth daily.   diltiazem 120 MG 24 hr capsule Commonly known as: CARDIZEM CD Take 120 mg by mouth daily. ONLY IN THE AM Takes a total of 240 mg daily   diltiazem 60 MG 12 hr capsule Commonly known as: CARDIZEM SR Take 60 mg by mouth in the morning and at bedtime. TAKE AT 12PM AND 8PM   famotidine 20 MG tablet Commonly known as: PEPCID Take 1 tablet by mouth in the morning and at bedtime.   furosemide 20 MG tablet Commonly  known as: LASIX Take 20 mg by mouth.   levothyroxine 125 MCG tablet Commonly known as: SYNTHROID Take 1 tablet (125 mcg total) by mouth daily.   magnesium oxide 400 MG tablet Commonly known as: MAG-OX Take 400 mg by mouth daily.   metoprolol tartrate 25 MG tablet Commonly known as: LOPRESSOR Take 1 tablet by mouth every 6 (six) hours as needed.   mupirocin ointment 2 % Commonly known as: BACTROBAN mupirocin 2 % topical ointment  APPLY TOPICALLY 2 (TWO) TIMES DAILY AS NEEDED.   rosuvastatin 5 MG tablet  Commonly known as: CRESTOR rosuvastatin 5 mg tablet          Review of Systems  HYPERCALCEMIA: She has mild hyperparathyroidism with variable increase in calcium levels Previously had increased PTH level  also  Highest TSH has been 10.7 with her PCP   Lab Results  Component Value Date   PTH 76 (H) 11/24/2017   CALCIUM 10.3 09/16/2020    OSTEOPENIA: In 2019 she was found to have osteopenia as follows, this was her first bone density  Bone density in 08/2020 is stable to improved, has not been on treatment   Lumbar spine L1-L4 Femoral neck (FN) 33% distal radius  T-score   -1.3 RFN: -1.6 LFN: -1.8 -2.5  Change in BMD from previous DXA test (%) Up 3.5%* Up 2.0% Down 0.5%  (*) statistically significant  In 05/2018:   Lumbar spine L1-L4 Femoral neck (FN) 33% distal radius  T-score -1.6 RFN: -1.5 LFN: -2.1 -2.5   VITAMIN D deficiency:  She was found to have a low vitamin D level of 15 in 10/2017  She is taking the OTC vitamin D3, 2000 units daily although she is unclear about her dose today   Lab Results  Component Value Date   VD25OH 31.60 07/14/2020   VD25OH 37.79 06/17/2019   VD25OH 33.12 05/31/2018               Examination:    BP 132/78   Pulse 89   Ht 5\' 5"  (1.651 m)   Wt 159 lb 6.4 oz (72.3 kg)   SpO2 97%   BMI 26.53 kg/m     Assessment:  HYPOTHYROIDISM, secondary to I-131 ablation of thyroid for hyperthyroidism  She has been on  125 mcg levothyroxine for the last 3 months Her symptoms of fatigue do not correlate with her hypothyroidism Also appears to be needing relatively higher doses and even with increasing the dose on her last visit her TSH is higher at 7.6 last month  We will recheck her TSH to make sure it is consistent and decide on dosage  Fatigue: She likely needs to be evaluated for sleep apnea  HYPERPARATHYROIDISM: Has had mild hypercalcemia with more recent calcium 10.6 done by PCP    PLAN:   TSH to be done today as above, will factor in the fact that she missed her dose on Sunday Dosage adjustment to be done if needed   Follow-up to be decided  There are no Patient Instructions on file for this visit.    April Padilla 12/22/2020, 9:56 AM     Note: This office note was prepared with Dragon voice recognition system technology. Any transcriptional errors that result from this process are unintentional.  Addendum: TSH still high, she will go up to 125 mcg levothyroxine and follow-up in 3 months

## 2020-12-22 NOTE — Progress Notes (Signed)
Please call to let patient know that the thyroid results are normal and no change in medication needed, schedule follow-up for 4 months, same-day labs

## 2020-12-23 ENCOUNTER — Ambulatory Visit: Payer: Medicare Other | Admitting: Cardiology

## 2021-01-03 ENCOUNTER — Other Ambulatory Visit: Payer: Self-pay | Admitting: Endocrinology

## 2021-01-03 DIAGNOSIS — E890 Postprocedural hypothyroidism: Secondary | ICD-10-CM

## 2021-01-19 ENCOUNTER — Telehealth: Payer: Self-pay | Admitting: Cardiology

## 2021-01-19 NOTE — Telephone Encounter (Signed)
Pt c/o BP issue: STAT if pt c/o blurred vision, one-sided weakness or slurred speech  1. What are your last 5 BP readings? 158/93 this morning, 124/78, 130/70 HR 113  2. Are you having any other symptoms (ex. Dizziness, headache, blurred vision, passed out)? Slight headache, dizziness  3. What is your BP issue? Patient states her BP is high and she has been having dizziness. She states when she gets up in the morning she notices she pulls to the left. She states she had this several years ago, but it is not as bad. Please advise.

## 2021-01-19 NOTE — Telephone Encounter (Signed)
Spoke to the patient just now and she let me know that her blood pressure was 158/93 before taking her medications. After taking her medications this morning her blood pressure was 124/78. She took it again about an hour it was 130/70. I advised to her that this blood pressure is good and that she should always take her blood pressure about 1-2 hours after her medications. I advised to her that she should see her PCP in regards to this dizziness as her blood pressure is good. She verbalizes understanding.

## 2021-01-26 ENCOUNTER — Telehealth: Payer: Self-pay | Admitting: Cardiology

## 2021-01-26 MED ORDER — DILTIAZEM HCL ER COATED BEADS 120 MG PO CP24: 120.0000 mg | ORAL_CAPSULE | Freq: Every day | ORAL | 3 refills | Status: DC

## 2021-01-26 NOTE — Telephone Encounter (Signed)
Refill sent in per request.  

## 2021-01-26 NOTE — Telephone Encounter (Signed)
*  STAT* If patient is at the pharmacy, call can be transferred to refill team.   1. Which medications need to be refilled? (please list name of each medication and dose if known) diltiazem 120 mg 24  2. Which pharmacy/location (including street and city if local pharmacy) is [facility]  3. Do they need a 30 day or 90 day supply? [place]

## 2021-02-08 MED ORDER — DILTIAZEM HCL ER COATED BEADS 120 MG PO CP24: 120.0000 mg | ORAL_CAPSULE | Freq: Every day | ORAL | 3 refills | Status: DC

## 2021-02-08 NOTE — Telephone Encounter (Signed)
PT is calling back due to this prescription not being sent. PT only has 2 tablets left. Please advise

## 2021-02-08 NOTE — Addendum Note (Signed)
Addended by: Resa Miner I on: 02/08/2021 03:53 PM   Modules accepted: Orders

## 2021-02-08 NOTE — Telephone Encounter (Signed)
Spoke to the patient just now and let her know that I called this medication in for her again. She verbalizes understanding and will let us know if she has any issues.    Encouraged patient to call back with any questions or concerns.

## 2021-02-23 ENCOUNTER — Ambulatory Visit: Payer: Medicare Other | Admitting: Endocrinology

## 2021-03-16 ENCOUNTER — Ambulatory Visit: Payer: PRIVATE HEALTH INSURANCE | Admitting: Endocrinology

## 2021-04-27 ENCOUNTER — Ambulatory Visit (INDEPENDENT_AMBULATORY_CARE_PROVIDER_SITE_OTHER): Payer: Medicare Other | Admitting: Endocrinology

## 2021-04-27 ENCOUNTER — Other Ambulatory Visit: Payer: Self-pay

## 2021-04-27 VITALS — BP 130/78 | HR 83 | Ht 66.0 in | Wt 161.6 lb

## 2021-04-27 DIAGNOSIS — E890 Postprocedural hypothyroidism: Secondary | ICD-10-CM | POA: Diagnosis not present

## 2021-04-27 DIAGNOSIS — E210 Primary hyperparathyroidism: Secondary | ICD-10-CM

## 2021-04-27 LAB — BASIC METABOLIC PANEL
BUN: 16 mg/dL (ref 6–23)
CO2: 29 mEq/L (ref 19–32)
Calcium: 10.8 mg/dL — ABNORMAL HIGH (ref 8.4–10.5)
Chloride: 101 mEq/L (ref 96–112)
Creatinine, Ser: 0.93 mg/dL (ref 0.40–1.20)
GFR: 59.47 mL/min — ABNORMAL LOW (ref >60.00)
Glucose, Bld: 86 mg/dL (ref 70–99)
Potassium: 4.7 mEq/L (ref 3.5–5.1)
Sodium: 138 mEq/L (ref 135–145)

## 2021-04-27 LAB — T4, FREE: Free T4: 1.04 ng/dL (ref 0.60–1.60)

## 2021-04-27 LAB — TSH: TSH: 3.81 u[IU]/mL (ref 0.35–4.50)

## 2021-04-27 MED ORDER — EUTHYROX 100 MCG PO TABS: ORAL_TABLET | ORAL | 2 refills | Status: DC

## 2021-04-27 NOTE — Progress Notes (Signed)
Patient ID: April Padilla, female   DOB: April 10, 1946, 75 y.o.   MRN: [id number]               Reason for Appointment: Endocrinology follow-up visit    History of Present Illness:   Problem 1:  Hypothyroidism was first diagnosed in the 1990s after treatment of hyperthyroidism with radioactive iodine Details are not available She thinks that for several years she had been stable on 88 g of levothyroxine  In 07/2017 she had a routine TSH test and this was found to be high at 7.9 Subsequently her levothyroxine doses have been increased and she has been taking 112 mcg since late 2018  Subsequently her dose was reduced to 100 mcg levothyroxine by her PCP With this her TSH was normal in 06/2019, subsequently higher  She is she is supposed to be taking 125 mcg since 11/21 when her TSH was 5.3 However she now thinks that she has been taking 100 mcg all along  She complains of excessive fatigue which she thinks is from her cardiac arrhythmia In the last few weeks she may have had more hair loss  Her weight is leveled off  She is quite consistent with taking her levothyroxine in the morning an hour before breakfast, however she forgot the tablet last Sunday even though she has a pillbox  Thyroid level done elsewhere in April which showed TSH of 0.28 Labs be checked today         Patient's weight history is as follows:  Wt Readings from Last 3 Encounters:  04/27/21 161 lb 9.6 oz (73.3 kg)  12/22/20 159 lb 6.4 oz (72.3 kg)  12/21/20 160 lb 3.2 oz (72.7 kg)    Thyroid function results have been as follows:  Lab Results  Component Value Date   TSH 1.64 12/22/2020   TSH 7.60 (A) 11/12/2020   TSH 5.29 (H) 09/16/2020   TSH 5.87 (H) 07/14/2020   FREET4 1.15 09/16/2020   FREET4 1.05 07/14/2020   FREET4 1.06 06/17/2019   FREET4 1.10 05/31/2018   T3FREE 2.5 09/16/2020    HYPERCALCEMIA: See review of systems    Past Medical History:  Diagnosis Date   Cervical  intraepithelial neoplasia grade III with severe dysplasia 10/31/2000   Chest discomfort 12/23/2018   Chronic atrial fibrillation ([place]) 01/26/2016   Last Assessment & Plan:  Has had ablation through [facility] , her BP here was stable , discussed her current meds and the S/E , she is off of the lasix now and will see her CMP for her as well as CK today for her muscle cramps she has had long before her ablation with possible pain from the statin   Chronic fatigue 07/25/2016   Chronic idiopathic constipation 01/26/2016   Dysrhythmia    on toprol for fast heart beat   Encounter for care of pacemaker 12/19/2018   Essential (primary) hypertension 01/26/2016   Family history of colon cancer in mother 07/17/2017   GERD (gastroesophageal reflux disease) 07/17/2017   H/O hiatal hernia    High risk medication use 01/26/2016   History of adenomatous polyp of colon 07/17/2017   Hypercalcemia 01/26/2016   Hypothyroidism    pt. states she took radioactive pill and has no thyroid   Irregular heart rate 07/06/2018   Left shoulder pain 07/06/2018   Long term current use of anticoagulant 07/06/2018   Mixed hyperlipidemia 01/26/2016   Myalgia 05/18/2016   Last Assessment & Plan:  Formatting of this note might be  different from the original. 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   Presence of left artificial knee joint 01/22/2015   Primary generalized (osteo)arthritis 01/26/2016   Primary hyperparathyroidism ([place]) 11/26/2017   Primary osteoarthritis involving multiple joints 01/26/2016   PSVT (paroxysmal supraventricular tachycardia) ([place]) 01/26/2016   Recurrent urticaria 01/26/2016   S/P TKR (total knee replacement) 12/27/2012   Shortness of breath    with exertion    Sinobronchitis 09/07/2018   Situational anxiety 09/07/2018   Thyroid function test abnormal 08/19/2016   Vitamin D deficiency 11/26/2017    Past Surgical History:  Procedure Laterality Date   BUNIONECTOMY  yrs. ago   left foot   FOOT SURGERY  2009   took knot out  and put in screws   KNEE ARTHROSCOPY W/ MENISCAL REPAIR  2009   left   TOTAL KNEE ARTHROPLASTY  01/16/2012   Procedure: TOTAL KNEE ARTHROPLASTY;  Surgeon: Rudean Haskell, MD;  Location: [facility];  Service: Orthopedics;  Laterality: Left;    Family History  Problem Relation Age of Onset   Cancer Mother    Thyroid disease Mother    Stroke Father    Anesthesia problems Neg Hx    Hypotension Neg Hx    Malignant hyperthermia Neg Hx    Pseudochol deficiency Neg Hx     Social History:  reports that she has never smoked. She has never used smokeless tobacco. She reports that she does not drink alcohol and does not use drugs.  Allergies:  Allergies  Allergen Reactions   Clindamycin/Lincomycin Rash   Nitrofurantoin Monohyd Macro Rash   Silver Sulfadiazine Rash    Allergies as of 04/27/2021       Reactions   Clindamycin/lincomycin Rash   Nitrofurantoin Monohyd Macro Rash   Silver Sulfadiazine Rash        Medication List        Accurate as of April 27, 2021 10:32 AM. If you have any questions, ask your nurse or doctor.          ALPRAZolam 0.5 MG tablet Commonly known as: XANAX Take 0.5 mg by mouth daily as needed.   apixaban 5 MG Tabs tablet Commonly known as: ELIQUIS Take 5 mg by mouth 2 (two) times daily.   cholecalciferol 25 MCG (1000 UNIT) tablet Commonly known as: VITAMIN D3 Take 2,000 Units by mouth daily.   diltiazem 120 MG 24 hr capsule Commonly known as: CARDIZEM CD Take 1 capsule (120 mg total) by mouth daily. ONLY IN THE AM Takes a total of 240 mg daily   diltiazem 60 MG 12 hr capsule Commonly known as: CARDIZEM SR Take 60 mg by mouth in the morning and at bedtime. TAKE AT 12PM AND  8PM   famotidine 20 MG tablet Commonly known as: PEPCID Take 1 tablet by mouth in the morning and at bedtime.   furosemide 20 MG tablet Commonly known as: LASIX Take 20 mg by mouth.   levothyroxine 125 MCG tablet Commonly known as: SYNTHROID TAKE 1 TABLET BY MOUTH EVERY DAY   magnesium oxide 400 MG tablet Commonly known as: MAG-OX Take 400 mg by mouth daily.   metoprolol tartrate 25 MG tablet Commonly known as: LOPRESSOR Take 1 tablet by  mouth every 6 (six) hours as needed.   mupirocin ointment 2 % Commonly known as: BACTROBAN mupirocin 2 % topical ointment  APPLY TOPICALLY 2 (TWO) TIMES DAILY AS NEEDED.   rosuvastatin 5 MG tablet Commonly known as: CRESTOR rosuvastatin 5 mg tablet           Review of Systems  HYPERCALCEMIA: She has mild hyperparathyroidism with variable increase in calcium levels Previously had increased PTH level  also  Highest TSH has been 10.7 previously   Lab Results  Component Value Date   PTH 76 (H) 11/24/2017   CALCIUM 10.3 09/16/2020    OSTEOPENIA: In 2019 she was found to have osteopenia as follows, this was her first bone density  Bone density in 08/2020 is stable to improved, has not been on treatment    Lumbar spine L1-L4 Femoral neck (FN) 33% distal radius  T-score   -1.3 RFN: -1.6 LFN: -1.8 -2.5  Change in BMD from previous DXA test (%) Up 3.5%* Up 2.0% Down 0.5%  (*) statistically significant  In 05/2018:    Lumbar spine L1-L4 Femoral neck (FN) 33% distal radius  T-score -1.6 RFN: -1.5 LFN: -2.1 -2.5   VITAMIN D deficiency:  She was found to have a low vitamin D level of 15 in 10/2017  She is taking the OTC vitamin D3, 2000 units daily    Lab Results  Component Value Date   VD25OH 31.60 07/14/2020   VD25OH 37.79 06/17/2019   VD25OH 33.12 05/31/2018               Examination:    BP 130/78   Pulse 83   Ht 5\' 6"  (1.676 m)   Wt 161 lb 9.6 oz (73.3 kg)   SpO2 96%   BMI 26.08 kg/m      Assessment:  HYPOTHYROIDISM, secondary to I-131 ablation of thyroid for hyperthyroidism  She has had somewhat variable thyroid levels in the last few months  Her symptoms of fatigue do not correlate with her hypothyroidism Although she was supposed to be taking 125 mcg levothyroxine she thinks she has been taking the 100 mcg dose which is a yellow tablet [facility] She has been regular with taking her supplements before eating  Fatigue: Etiology unclear  HYPERPARATHYROIDISM: Has had mild hypercalcemia and will need follow-up labs    PLAN:   TSH to be done today May consider using brand-name product instead of generic for better consistency   Follow-up to be decided  There are no Patient Instructions on file for this visit.    Elayne Snare 04/27/2021, 10:32 AM     Note: This office note was prepared with Dragon voice recognition system technology. Any transcriptional errors that result from this process are unintentional.   ADDENDUM: TSH is 3.8, will switch her to Euthyrox 100 mcg, 7-1/2 tablets a week and follow-up in 2 months

## 2021-04-28 ENCOUNTER — Telehealth: Payer: Self-pay | Admitting: Endocrinology

## 2021-04-28 NOTE — Telephone Encounter (Signed)
Patient requests to be called at ph# [phone] re: Patient states Dr. Dwyane Dee sent Patient a message on MyChart but Patient does not use MyChart.

## 2021-04-29 NOTE — Telephone Encounter (Signed)
I called pt and spoke with her. Message she received was note from lab results. Went over with pt and gave changes to medication. Pt understood.

## 2021-05-11 ENCOUNTER — Ambulatory Visit (INDEPENDENT_AMBULATORY_CARE_PROVIDER_SITE_OTHER): Payer: Medicare Other

## 2021-05-11 DIAGNOSIS — I480 Paroxysmal atrial fibrillation: Secondary | ICD-10-CM | POA: Diagnosis not present

## 2021-05-12 DIAGNOSIS — R7309 Other abnormal glucose: Secondary | ICD-10-CM | POA: Insufficient documentation

## 2021-05-12 HISTORY — DX: Other abnormal glucose: R73.09

## 2021-05-13 LAB — CUP PACEART REMOTE DEVICE CHECK
Battery Remaining Longevity: 96 mo
Battery Voltage: 3 V
Brady Statistic AS VP Percent: 0.73 %
Brady Statistic AS VS Percent: 27.86 %
Brady Statistic RV Percent Paced: 2.05 %
Date Time Interrogation Session: 20220713180713
Implantable Pulse Generator Implant Date: 20200210
Lead Channel Impedance Value: 620 Ohm
Lead Channel Pacing Threshold Amplitude: 0.75 V
Lead Channel Pacing Threshold Pulse Width: 0.24 ms
Lead Channel Sensing Intrinsic Amplitude: 26.213 mV
Lead Channel Setting Pacing Amplitude: 1 V
Lead Channel Setting Pacing Pulse Width: 0.24 ms
Lead Channel Setting Sensing Sensitivity: 2 mV

## 2021-05-31 ENCOUNTER — Telehealth: Payer: Self-pay | Admitting: Cardiology

## 2021-05-31 NOTE — Telephone Encounter (Signed)
Pt c/o swelling: STAT is pt has developed SOB within 24 hours  If swelling, where is the swelling located? R leg  How much weight have you gained and in what time span? 3 pounds in a week  Have you gained 3 pounds in a day or 5 pounds in a week?   Do you have a log of your daily weights (if so, list)? no  Are you currently taking a fluid pill? Patient only takes it as needed. She only took 10 mg last week   Are you currently SOB? No.  She is only sometimes in the morning when she wakes up  Have you traveled recently? No  Pt c/o medication issue:  1. Name of Medication: diltiazem (CARDIZEM SR) 60 MG 12 hr capsule  2. How are you currently taking this medication (dosage and times per day)? Three times Daily   3. Are you having a reaction (difficulty breathing--STAT)? no  4. What is your medication issue? Patient was told to stop taking this medication  about a month or so ago, but she was not sure what to take in it's place, so she did not stop taking the medicine    Patient also sent a MyChart message to the office , but was told it could take up to 48 hours to get a response. She was hoping to hear something today

## 2021-05-31 NOTE — Telephone Encounter (Signed)
-  Per Dr. Bettina Gavia: First Cardizem does not cause cardiac arrest   It can cause swelling in the lower extremities but she needs to take it because of her atrial fibrillation   I would advise her to take furosemide daily with her swelling.   She should be scheduled soon to see me for yearly follow-up.   She is anticoagulated and I do not think we need to be concerned about DVT   -I spoke with the patient advise of the following information per Dr. Bettina Gavia. She asked how much of the lasix should she take, I advised to take her normal dose of '20mg'$  daily rather than as needed. Patient verbalized understanding.

## 2021-06-03 NOTE — Progress Notes (Signed)
Remote pacemaker transmission.   

## 2021-06-10 ENCOUNTER — Telehealth: Payer: Self-pay | Admitting: Cardiology

## 2021-06-10 MED ORDER — METOPROLOL TARTRATE 25 MG PO TABS: 25.0000 mg | ORAL_TABLET | Freq: Four times a day (QID) | ORAL | 3 refills | Status: DC | PRN

## 2021-06-10 NOTE — Telephone Encounter (Signed)
Pt c/o medication issue:  1. Name of Medication: furosemide (LASIX) 20 MG tablet  2. How are you currently taking this medication (dosage and times per day)? 20 mg daily   3. Are you having a reaction (difficulty breathing--STAT)?   4. What is your medication issue? Patient wanted to know if she needs to be taking potassium in addition to taking this medication. Please advise

## 2021-06-10 NOTE — Telephone Encounter (Signed)
*  STAT* If patient is at the pharmacy, call can be transferred to refill team.   1. Which medications need to be refilled? (please list name of each medication and dose if known)  metoprolol tartrate (LOPRESSOR) 25 MG tablet  2. Which pharmacy/location (including street and city if local pharmacy) is medication to be sent to? [id number] [place] [place] [facility] [address]  3. Do they need a 30 day or 90 day supply? 30 with refills

## 2021-06-10 NOTE — Telephone Encounter (Signed)
Called patient back about her message. Informed patient that Dr. Bettina Gavia did not prescribe any potassium with her Lasix. Informed patient her last lab work showed her potassium at 4.7, which is on the upper side of normal. Informed patient that Dr. Bettina Gavia could get a BMET at her office visit on 06/23/21 to recheck after she has been on Lasix for a little over a week. Patient agreed to plan.

## 2021-06-14 ENCOUNTER — Other Ambulatory Visit: Payer: Self-pay | Admitting: Endocrinology

## 2021-06-22 NOTE — Progress Notes (Signed)
Cardiology Office Note:    Date:  06/23/2021   ID:  April Padilla, April Padilla 06-28-1946, MRN [id number]  PCP:  [name], MD  Cardiologist:  Shirlee More, MD    Referring MD: [name], MD    ASSESSMENT:    1. Chronic atrial fibrillation ([place])   2. Hypertensive heart disease without heart failure   3. Chronic anticoagulation   4. Sick sinus syndrome ([place])   5. Pacemaker    PLAN:    In order of problems listed above:  She has atrial fibrillation remains quite symptomatic particularly of rapid rate but is poorly tolerant of rate control medication particularly diltiazem.  Unfortunately other physician stopped digoxin she does not want to try it again.  We will transition diltiazem to verapamil tosee if it is better tolerated but in the end I think she will need AV nodal ablation.  She request to see another EP doctor in our group and I will have her set up with Dr. Kirke Shaggy continue current treatment see above Manage our device clinic   Next appointment: 6 months   Medication Adjustments/Labs and Tests Ordered: Current medicines are reviewed at length with the patient today.  Concerns regarding medicines are outlined above.  No orders of the defined types were placed in this encounter.  No orders of the defined types were placed in this encounter.   Chief Complaint  Patient presents with   Follow-up    History of Present Illness:    April Padilla is a 75 y.o. female with a hx of atrial fibrillation failing to maintain sinus rhythm despite multiple EP catheter ablations and antiarrhythmic drug therapy and cardioversion including flecainide dronedarone Ranexa and dofetilide.  With bradycardia she had a leadless ventricular pacemaker implanted a synchronous rhythm 50 bpm.  Other problems include hypertension hyperlipidemia.  Her electrophysiologist Dr. Norm Salt at [facility] and incision was made in June 2021 to withdrawal antiarrhythmic drug therapy and  except for rate controlled atrial fibrillation.  Her echocardiogram at [facility] February 2020 showed normal right and left ventricular function mild mitral regurgitation mild tricuspid regurgitation.  She was last seen 06/19/2020 and continued to have a poor quality of life even with rate control. Compliance with diet, lifestyle and medications: Yes  3-day event monitor reported 07/18/2020 showed atrial fibrillation with a controlled ventricular rate the triggered events were generally associated with rates greater than 100 bpm.  She was seen at [facility] in EP follow-up December 2021 pacemaker function was normal with some minimal RV tracing.  The option of AV nodal ablation and insertion of conduction system pacing device was discussed.  An echocardiogram performed in our office or 11/11/2020 showing normal left ventricular size wall thickness was mild LVH and normal systolic function.  The right ventricle was moderately enlarged and mild elevation of pulmonary artery systolic pressure 41 mmHg left atrium is moderately enlarged right atrium severely enlarged.  Is a very complex case she had been following with EP specialist at [facility] and there is consideration of AV nodal ablation. Taking beta-blocker and calcium channel blocker the rate is controlled she gets a low blood pressure with oral Cardizem as edema and feels badly when she takes a diuretic. I have placed her on digoxin to help moderate her rate looking to decrease her calcium channel blocker dose however she had oxygen sats of 91 to 93% saw her primary care physician and they stopped digoxin. She has excessive daytime sleeping snore severely and her husband  sleeps in another bedroom. On the referral for sleep study is obvious that she has sleep apnea. We will transition from diltiazem to verapamil to try to control rate without the same degree of edema and low blood pressure but I think in the end she will need to have AV nodal ablation. No shortness  of breath syncope or chest pain no bleeding from her anticoagulant.  Recent labs 04/27/2021 TSH 3.8 Cholesterol 11/12/1998 22-30 LDL 103 triglycerides 88 HDL 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   Chest discomfort 12/23/2018   Chronic atrial fibrillation ([place]) 01/26/2016   Last Assessment & Plan:  Has had ablation through [facility] , her BP here was stable , discussed her current meds and the S/E , she is off of the lasix now and will see her CMP for her as well as CK today for her muscle cramps she has had long before her ablation with possible pain from the statin   Chronic fatigue 07/25/2016   Chronic idiopathic constipation 01/26/2016   Dysrhythmia    on toprol for fast heart beat   Encounter for care of pacemaker 12/19/2018   Essential (primary) hypertension 01/26/2016   Family history of colon cancer in mother 07/17/2017   GERD (gastroesophageal reflux disease) 07/17/2017   H/O hiatal hernia    High risk medication use 01/26/2016   History of adenomatous polyp of colon 07/17/2017   Hypercalcemia 01/26/2016   Hypothyroidism    pt. states she took radioactive pill and has no thyroid   Irregular heart rate 07/06/2018   Left shoulder pain 07/06/2018   Long term current use of anticoagulant 07/06/2018   Mixed hyperlipidemia 01/26/2016   Myalgia 05/18/2016   Last Assessment & Plan:  Formatting of this note might be different from the original. 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   Presence of left artificial knee joint 01/22/2015   Primary generalized (osteo)arthritis 01/26/2016   Primary hyperparathyroidism ([place]) 11/26/2017   Primary osteoarthritis involving  multiple joints 01/26/2016   PSVT (paroxysmal supraventricular tachycardia) ([place]) 01/26/2016   Recurrent urticaria 01/26/2016   S/P TKR (total knee replacement) 12/27/2012   Shortness of breath    with exertion   Sinobronchitis 09/07/2018   Situational anxiety 09/07/2018   Thyroid function test abnormal 08/19/2016   Vitamin D deficiency 11/26/2017    Past Surgical History:  Procedure Laterality Date   BUNIONECTOMY  yrs. ago   left foot   FOOT SURGERY  2009   took knot out  and put in screws   KNEE ARTHROSCOPY W/ MENISCAL REPAIR  2009   left   TOTAL KNEE ARTHROPLASTY  01/16/2012   Procedure: TOTAL KNEE ARTHROPLASTY;  Surgeon: Rudean Haskell, MD;  Location: [place];  Service: Orthopedics;  Laterality: Left;    Current Medications: Current Meds  Medication Sig   ALPRAZolam (XANAX) 0.5 MG tablet Take 0.5 mg by mouth daily as needed for anxiety or sleep.   apixaban (ELIQUIS) 5 MG TABS tablet Take 5 mg by mouth 2 (two) times daily.    cholecalciferol (VITAMIN D3) 25 MCG (1000 UNIT) tablet Take 2,000 Units by mouth daily.   diltiazem (CARDIZEM SR) 60 MG  12 hr capsule Take 60 mg by mouth in the morning and at bedtime. TAKE AT 12PM AND 8PM   EUTHYROX 100 MCG tablet 1 TABLET DAILY WITH EXTRA HALF TABLET ON SUNDAYS   famotidine (PEPCID) 20 MG tablet Take 1 tablet by mouth in the morning and at bedtime.   furosemide (LASIX) 20 MG tablet Take 20 mg by mouth.   magnesium oxide (MAG-OX) 400 MG tablet Take 400 mg by mouth daily.   metoprolol tartrate (LOPRESSOR) 25 MG tablet Take 1 tablet (25 mg total) by mouth every 6 (six) hours as needed (rapid heart rate.).   mupirocin ointment (BACTROBAN) 2 % Apply 1 application topically as needed (wound care).   rosuvastatin (CRESTOR) 5 MG tablet Take 5 mg by mouth daily.     Allergies:   Clindamycin/lincomycin, Nitrofurantoin monohyd macro, and Silver sulfadiazine   Social History   Socioeconomic History   Marital status: Married    Spouse name: Not on  file   Number of children: Not on file   Years of education: Not on file   Highest education level: Not on file  Occupational History   Not on file  Tobacco Use   Smoking status: Never   Smokeless tobacco: Never  Substance and Sexual Activity   Alcohol use: No    Comment: occasional   Drug use: No   Sexual activity: Yes  Other Topics Concern   Not on file  Social History Narrative   Not on file   Social Determinants of Health   Financial Resource Strain: Not on file  Food Insecurity: Not on file  Transportation Needs: Not on file  Physical Activity: Not on file  Stress: Not on file  Social Connections: Not on file     Family History: The patient's family history includes Cancer in her mother; Stroke in her father; Thyroid disease in her mother. There is no history of Anesthesia problems, Hypotension, Malignant hyperthermia, or Pseudochol deficiency. ROS:   Please see the history of present illness.    All other systems reviewed and are negative.  EKGs/Labs/Other Studies Reviewed:    The following studies were reviewed today:    Recent Labs: 06/29/2020: Hemoglobin 12.6; Platelets 249 04/27/2021: BUN 16; Creatinine, Ser 0.93; Potassium 4.7; Sodium 138; TSH 3.81  Recent Lipid Panel    Component Value Date/Time   CHOL 230 (A) 11/12/2020 0000   TRIG 88 11/12/2020 0000   HDL 95 (A) 11/12/2020 0000   LDLCALC 103 11/12/2020 0000    Physical Exam:    VS:  BP 110/80 (BP Location: Right Arm, Patient Position: Sitting, Cuff Size: Normal)   Pulse 94   Ht '5\' 6"'$  (1.676 m)   Wt 161 lb (73 kg)   SpO2 95%   BMI 25.99 kg/m     Wt Readings from Last 3 Encounters:  06/23/21 161 lb (73 kg)  04/27/21 161 lb 9.6 oz (73.3 kg)  12/22/20 159 lb 6.4 oz (72.3 kg)     GEN:  Well nourished, well developed in no acute distress HEENT: Normal NECK: No JVD; No carotid bruits LYMPHATICS: No lymphadenopathy CARDIAC: Irregular rate and rhythmno murmurs, rubs, gallops RESPIRATORY:   Clear to auscultation without rales, wheezing or rhonchi  ABDOMEN: Soft, non-tender, non-distended MUSCULOSKELETAL:  No edema; No deformity  SKIN: Warm and dry NEUROLOGIC:  Alert and oriented x 3 PSYCHIATRIC:  Normal affect    Signed, Shirlee More, MD  06/23/2021 1:06 PM    [facility]

## 2021-06-23 ENCOUNTER — Encounter: Payer: Self-pay | Admitting: Cardiology

## 2021-06-23 ENCOUNTER — Ambulatory Visit (INDEPENDENT_AMBULATORY_CARE_PROVIDER_SITE_OTHER): Payer: Medicare Other | Admitting: Cardiology

## 2021-06-23 ENCOUNTER — Other Ambulatory Visit: Payer: Self-pay

## 2021-06-23 VITALS — BP 110/80 | HR 94 | Ht 66.0 in | Wt 161.0 lb

## 2021-06-23 DIAGNOSIS — I495 Sick sinus syndrome: Secondary | ICD-10-CM | POA: Diagnosis not present

## 2021-06-23 DIAGNOSIS — Z7901 Long term (current) use of anticoagulants: Secondary | ICD-10-CM | POA: Diagnosis not present

## 2021-06-23 DIAGNOSIS — Z950 Presence of cardiac pacemaker: Secondary | ICD-10-CM

## 2021-06-23 DIAGNOSIS — I119 Hypertensive heart disease without heart failure: Secondary | ICD-10-CM | POA: Diagnosis not present

## 2021-06-23 DIAGNOSIS — I482 Chronic atrial fibrillation, unspecified: Secondary | ICD-10-CM

## 2021-06-23 MED ORDER — VERAPAMIL HCL 80 MG PO TABS: 80.0000 mg | ORAL_TABLET | Freq: Two times a day (BID) | ORAL | 3 refills | Status: DC

## 2021-06-23 NOTE — Patient Instructions (Addendum)
Medication Instructions:  Your physician has recommended you make the following change in your medication:   Stop diltiazem Start Verapamil 80 mg twice daily.  *If you need a refill on your cardiac medications before your next appointment, please call your pharmacy*   Lab Work: None ordered If you have labs (blood work) drawn today and your tests are completely normal, you will receive your results only by: [place] (if you have MyChart) OR A paper copy in the mail If you have any lab test that is abnormal or we need to change your treatment, we will call you to review the results.   Testing/Procedures: None ordered   Follow-Up: At [facility] [place], you and your health needs are our priority.  As part of our continuing mission to provide you with exceptional heart care, we have created designated Provider Care Teams.  These Care Teams include your primary Cardiologist (physician) and Advanced Practice Providers (APPs -  Physician Assistants and Nurse Practitioners) who all work together to provide you with the care you need, when you need it.  We recommend signing up for the patient portal called "MyChart".  Sign up information is provided on this After Visit Summary.  MyChart is used to connect with patients for Virtual Visits (Telemedicine).  Patients are able to view lab/test results, encounter notes, upcoming appointments, etc.  Non-urgent messages can be sent to your provider as well.   To learn more about what you can do with MyChart, go to [email or web address].    Your next appointment:   6 month(s)  The format for your next appointment:   In Person  Provider:   Shirlee More, MD   Other Instructions NA

## 2021-06-28 ENCOUNTER — Other Ambulatory Visit: Payer: Self-pay

## 2021-06-28 ENCOUNTER — Ambulatory Visit (INDEPENDENT_AMBULATORY_CARE_PROVIDER_SITE_OTHER): Payer: Medicare Other | Admitting: Endocrinology

## 2021-06-28 VITALS — BP 140/82 | HR 94 | Ht 66.0 in | Wt 164.0 lb

## 2021-06-28 DIAGNOSIS — G4730 Sleep apnea, unspecified: Secondary | ICD-10-CM

## 2021-06-28 DIAGNOSIS — E890 Postprocedural hypothyroidism: Secondary | ICD-10-CM

## 2021-06-28 DIAGNOSIS — R7303 Prediabetes: Secondary | ICD-10-CM | POA: Diagnosis not present

## 2021-06-28 LAB — T4, FREE: Free T4: 1.43 ng/dL (ref 0.60–1.60)

## 2021-06-28 LAB — GLUCOSE, RANDOM: Glucose, Bld: 83 mg/dL (ref 70–99)

## 2021-06-28 LAB — TSH: TSH: 0.43 u[IU]/mL (ref 0.35–5.50)

## 2021-06-28 NOTE — Progress Notes (Addendum)
Patient ID: April Padilla, female   DOB: [birth year], 75 y.o.   MRN: [id number]               Reason for Appointment: Endocrinology follow-up visit    History of Present Illness:   Problem 1:  Hypothyroidism was first diagnosed in the 1990s after treatment of hyperthyroidism with radioactive iodine Details are not available She thinks that for several years she had been stable on 88 g of levothyroxine  In 07/2017 she had a routine TSH test and this was found to be high at 7.9 Subsequently her levothyroxine doses have been very well between 112 and 125 mcg  In 6/22 when she was supposed to be taking 125 mcg since 11/21 she stated that she had not changed the dose and was on 100 mcg levothyroxine  With this her TSH was high normal at 3.8  She tends to have chronic symptoms of fatigue which she thinks is from her cardiac arrhythmia Also in the last few months she may have had more hair loss She has had some shakiness of her hands for quite some time  Her weight is unchanged  She was changed to EUTHYROX 100 mcg, taking an extra half tablet once a week with 1 tablet on other days since 6/22 She is getting coverage for this She did not feel any different with this  She is quite consistent with taking her levothyroxine in the morning an hour before breakfast, only once may have forgotten to take the extra half on a Sunday and she took it after eating  Labs be checked today         Patient's weight history is as follows:  Wt Readings from Last 3 Encounters:  06/28/21 164 lb (74.4 kg)  06/23/21 161 lb (73 kg)  04/27/21 161 lb 9.6 oz (73.3 kg)    Thyroid function results have been as follows:  Lab Results  Component Value Date   TSH 3.81 04/27/2021   TSH 1.64 12/22/2020   TSH 7.60 (A) 11/12/2020   TSH 5.29 (H) 09/16/2020   FREET4 1.04 04/27/2021   FREET4 1.15 09/16/2020   FREET4 1.05 07/14/2020   FREET4 1.06 06/17/2019   T3FREE 2.5 09/16/2020    HYPERCALCEMIA: See  review of systems    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   Chest discomfort 12/23/2018   Chronic atrial fibrillation ([place]) 01/26/2016   Last Assessment & Plan:  Has had ablation through [facility] , her BP here was stable , discussed her current meds and the S/E , she is off of the lasix now and will see her CMP for her as well as CK today for her muscle cramps she has had long before her ablation with possible pain from the statin   Chronic fatigue 07/25/2016   Chronic idiopathic constipation 01/26/2016   Dysrhythmia    on toprol for fast heart beat   Encounter for care of pacemaker 12/19/2018   Essential (primary) hypertension 01/26/2016   Family history of colon cancer in mother 07/17/2017   GERD (gastroesophageal reflux disease) 07/17/2017   H/O hiatal hernia    High risk medication use 01/26/2016   History of adenomatous polyp of colon 07/17/2017   Hypercalcemia 01/26/2016   Hypothyroidism    pt. states she took radioactive pill and has no thyroid   Irregular heart rate 07/06/2018   Left shoulder pain 07/06/2018   Long term current use of  anticoagulant 07/06/2018   Mixed hyperlipidemia 01/26/2016   Myalgia 05/18/2016   Last Assessment & Plan:  Formatting of this note might be different from the original. 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   Presence of left artificial knee joint 01/22/2015   Primary generalized (osteo)arthritis 01/26/2016   Primary hyperparathyroidism ([place]) 11/26/2017   Primary osteoarthritis involving multiple joints 01/26/2016   PSVT (paroxysmal supraventricular tachycardia) ([place]) 01/26/2016   Recurrent urticaria 01/26/2016   S/P TKR  (total knee replacement) 12/27/2012   Shortness of breath    with exertion   Sinobronchitis 09/07/2018   Situational anxiety 09/07/2018   Thyroid function test abnormal 08/19/2016   Vitamin D deficiency 11/26/2017    Past Surgical History:  Procedure Laterality Date   BUNIONECTOMY  yrs. ago   left foot   FOOT SURGERY  2009   took knot out  and put in screws   KNEE ARTHROSCOPY W/ MENISCAL REPAIR  2009   left   TOTAL KNEE ARTHROPLASTY  01/16/2012   Procedure: TOTAL KNEE ARTHROPLASTY;  Surgeon: Rudean Haskell, MD;  Location: [place];  Service: Orthopedics;  Laterality: Left;    Family History  Problem Relation Age of Onset   Cancer Mother    Thyroid disease Mother    Stroke Father    Anesthesia problems Neg Hx    Hypotension Neg Hx    Malignant hyperthermia Neg Hx    Pseudochol deficiency Neg Hx     Social History:  reports that she has never smoked. She has never used smokeless tobacco. She reports that she does not drink alcohol and does not use drugs.  Allergies:  Allergies  Allergen Reactions   Clindamycin/Lincomycin Rash   Nitrofurantoin Monohyd Macro Rash   Silver Sulfadiazine Rash    Allergies as of 06/28/2021       Reactions   Clindamycin/lincomycin Rash   Nitrofurantoin Monohyd Macro Rash   Silver Sulfadiazine Rash        Medication List        Accurate as of June 28, 2021  1:10 PM. If you have any questions, ask your nurse or doctor.          ALPRAZolam 0.5 MG tablet Commonly known as: XANAX Take 0.5 mg by mouth daily as needed for anxiety or sleep.   apixaban 5 MG Tabs tablet Commonly known as: ELIQUIS Take 5 mg by mouth 2 (two) times daily.   cholecalciferol 25 MCG (1000 UNIT) tablet Commonly known as: VITAMIN D3 Take 2,000 Units by mouth daily.   Euthyrox 100 MCG tablet Generic drug: levothyroxine 1 TABLET DAILY WITH EXTRA HALF TABLET ON SUNDAYS   famotidine 20 MG tablet Commonly known as: PEPCID Take 1 tablet by mouth in the  morning and at bedtime.   furosemide 20 MG tablet Commonly known as: LASIX Take 20 mg by mouth.   magnesium oxide 400 MG tablet Commonly known as: MAG-OX Take 400 mg by mouth daily.   metoprolol tartrate 25 MG tablet Commonly known as: LOPRESSOR Take 1 tablet (25 mg total) by mouth every 6 (six) hours as needed (rapid heart rate.).   mupirocin ointment 2 % Commonly known as: BACTROBAN Apply 1 application topically as needed (wound care).  rosuvastatin 5 MG tablet Commonly known as: CRESTOR Take 5 mg by mouth daily.   verapamil 80 MG tablet Commonly known as: Calan Take 1 tablet (80 mg total) by mouth 2 (two) times daily.           Review of Systems  Neurological:  Positive for tremors.   HYPERCALCEMIA: She has mild hyperparathyroidism with variable increase in calcium levels Previously had increased PTH level  also  Highest TSH has been 10.7 previously   Lab Results  Component Value Date   PTH 76 (H) 11/24/2017   CALCIUM 10.8 (H) 04/27/2021    OSTEOPENIA: In 2019 she was found to have osteopenia as follows, this was her first bone density  Bone density in 08/2020 is stable to improved, has not been on treatment    Lumbar spine L1-L4 Femoral neck (FN) 33% distal radius  T-score   -1.3 RFN: -1.6 LFN: -1.8 -2.5  Change in BMD from previous DXA test (%) Up 3.5%* Up 2.0% Down 0.5%  (*) statistically significant  In 05/2018:    Lumbar spine L1-L4 Femoral neck (FN) 33% distal radius  T-score -1.6 RFN: -1.5 LFN: -2.1 -2.5   VITAMIN D deficiency:  She was found to have a low vitamin D level of 15 in 10/2017  She is taking the OTC vitamin D3, 2000 units daily    Lab Results  Component Value Date   VD25OH 31.60 07/14/2020   VD25OH 37.79 06/17/2019   VD25OH 33.12 05/31/2018               Examination:    BP 140/82   Pulse 94   Ht '5\' 6"'$  (1.676 m)   Wt 164 lb (74.4 kg)   SpO2 100%   BMI 26.47 kg/m   No tremor present Biceps reflexes show normal  relaxation  Assessment:  HYPOTHYROIDISM, secondary to I-131 ablation of thyroid for hyperthyroidism  She has had somewhat variable thyroid levels for some time  She again has nonspecific symptoms especially fatigue, hair loss and tremor which do not appear to be related to the thyroid  She is now taking Euthyrox brand for avoiding inconsistency  She has been regular with taking her supplements before breakfast except once  Hair loss: Etiology unclear and she can continue to work with her dermatologist  HYPERPARATHYROIDISM: Has had mild hypercalcemia with last calcium 10.8  Recently has A1c of 6% but no fasting glucose available    PLAN:   TSH to be done today Her dose will be adjusted accordingly  Will check postprandial blood sugar today to correlate with her A1c   Follow-up to be decided based on her labs  There are no Patient Instructions on file for this visit.    Elayne Snare 06/28/2021, 1:10 PM     Note: This office note was prepared with Dragon voice recognition system technology. Any transcriptional errors that result from this process are unintentional.  Addendum: TSH is 0.43, she will take 7 tablets a week instead of 7-1/2 and follow-up in November

## 2021-06-28 NOTE — Progress Notes (Signed)
Order placed for sleep study and sleep clinic was called. I left a message for them to call me back to schedule this.

## 2021-06-29 ENCOUNTER — Telehealth: Payer: Self-pay

## 2021-06-29 NOTE — Telephone Encounter (Signed)
-----   Message from Truddie Hidden, RN sent at 06/28/2021 10:24 AM EDT ----- Regarding: FW: sleep study  ----- Message ----- From: Lauralee Evener, CMA Sent: 06/24/2021   1:53 PM EDT To: Truddie Hidden, RN Subject: RE: sleep study                                Patient's insurance does not require a PA. Call [phone] to schedule the appointment. ----- Message ----- From: Truddie Hidden, RN Sent: 06/23/2021   1:57 PM EDT To: [facility] Sleep [facility] Subject: sleep study                                    Pt needs a sleep study. Thanks Shonda

## 2021-06-29 NOTE — Telephone Encounter (Signed)
Spoke to the sleep study clinic just now and got the patient scheduled for 08/16/2021 at 8 pm.   I called the patient just now to let her know this information. She verbalized understanding and thanked me for calling her.    Encouraged patient to call back with any questions or concerns.

## 2021-08-03 ENCOUNTER — Other Ambulatory Visit: Payer: Self-pay | Admitting: Endocrinology

## 2021-08-03 ENCOUNTER — Other Ambulatory Visit: Payer: Self-pay | Admitting: Cardiology

## 2021-08-04 ENCOUNTER — Telehealth: Payer: Self-pay | Admitting: Cardiology

## 2021-08-04 MED ORDER — METOPROLOL TARTRATE 25 MG PO TABS: 25.0000 mg | ORAL_TABLET | Freq: Four times a day (QID) | ORAL | 3 refills | Status: DC | PRN

## 2021-08-04 MED ORDER — METOPROLOL TARTRATE 25 MG PO TABS: 25.0000 mg | ORAL_TABLET | Freq: Two times a day (BID) | ORAL | 3 refills | Status: DC

## 2021-08-04 NOTE — Telephone Encounter (Signed)
Medication sent in  per recommendation.

## 2021-08-04 NOTE — Telephone Encounter (Signed)
Refill sent in per request.  

## 2021-08-04 NOTE — Telephone Encounter (Signed)
*  STAT* If patient is at the pharmacy, call can be transferred to refill team.   1. Which medications need to be refilled? (please list name of each medication and dose if known) metoprolol tartrate (LOPRESSOR) 25 MG tablet  2. Which pharmacy/location (including street and city if local pharmacy) is medication to be sent to? [id number] - [place], [place] - [address]  3. Do they need a 30 day or 90 day supply? [place]

## 2021-08-04 NOTE — Telephone Encounter (Signed)
Pt c/o medication issue:  1. Name of Medication:  metoprolol tartrate (LOPRESSOR) 25 MG tablet  2. How are you currently taking this medication (dosage and times per day)? Take 1 tablet (25 mg total) by mouth every 6 (six) hours as needed (rapid heart rate.).  3. Are you having a reaction (difficulty breathing--STAT)? no  4. What is your medication issue? Pt states that a 90 day supply should have been sent over, pt only got  a 30 day supply... please correct

## 2021-08-04 NOTE — Addendum Note (Signed)
Addended by: Resa Miner I on: 08/04/2021 01:24 PM   Modules accepted: Orders

## 2021-08-04 NOTE — Telephone Encounter (Signed)
Spoke to the patient just now and she let me know that she is taking this lopressor 25 mg tablets BID every day like it is scheduled. I will see if Dr. Bettina Gavia would like to change this from PRN to scheduled BID.

## 2021-08-10 ENCOUNTER — Ambulatory Visit (INDEPENDENT_AMBULATORY_CARE_PROVIDER_SITE_OTHER): Payer: Medicare Other

## 2021-08-10 DIAGNOSIS — I482 Chronic atrial fibrillation, unspecified: Secondary | ICD-10-CM | POA: Diagnosis not present

## 2021-08-10 LAB — CUP PACEART REMOTE DEVICE CHECK
Battery Remaining Longevity: 96 mo
Battery Voltage: 3 V
Brady Statistic AS VP Percent: 0.63 %
Brady Statistic AS VS Percent: 28.22 %
Brady Statistic RV Percent Paced: 1.71 %
Date Time Interrogation Session: 20221011115813
Implantable Pulse Generator Implant Date: 20200210
Lead Channel Impedance Value: 580 Ohm
Lead Channel Pacing Threshold Amplitude: 0.75 V
Lead Channel Pacing Threshold Pulse Width: 0.24 ms
Lead Channel Sensing Intrinsic Amplitude: 23.625 mV
Lead Channel Setting Pacing Amplitude: 1 V
Lead Channel Setting Pacing Pulse Width: 0.24 ms
Lead Channel Setting Sensing Sensitivity: 2 mV

## 2021-08-16 ENCOUNTER — Other Ambulatory Visit: Payer: Self-pay

## 2021-08-16 ENCOUNTER — Encounter: Payer: Self-pay | Admitting: Cardiology

## 2021-08-16 ENCOUNTER — Ambulatory Visit (INDEPENDENT_AMBULATORY_CARE_PROVIDER_SITE_OTHER): Payer: Medicare Other | Admitting: Cardiology

## 2021-08-16 ENCOUNTER — Encounter (HOSPITAL_BASED_OUTPATIENT_CLINIC_OR_DEPARTMENT_OTHER): Payer: Medicare Other | Admitting: Cardiovascular Disease

## 2021-08-16 VITALS — BP 121/74 | HR 95 | Ht 66.0 in | Wt 158.6 lb

## 2021-08-16 DIAGNOSIS — I4819 Other persistent atrial fibrillation: Secondary | ICD-10-CM

## 2021-08-16 NOTE — Progress Notes (Signed)
Electrophysiology Office Note   Date:  08/16/2021   ID:  Jewels, Langone Mar 23, 1946, MRN [id number]  PCP:  [name], MD  Cardiologist:  Bettina Gavia Primary Electrophysiologist:  Damere Brandenburg Meredith Leeds, MD    Chief Complaint: AF   History of Present Illness: April Padilla is a 75 y.o. female who is being seen today for the evaluation of AF at the request of Bettina Gavia, [name], MD. Presenting today for electrophysiology evaluation.  She has a long history of atrial fibrillation.  She underwent cryoablation in 2016 with Dr. Elonda Husky.  She did well initially, but has unfortunately developed recurrent episodes of atrial fibrillation.  She failed medical therapy with Multaq, flecainide, propafenone, Ranexa.  She underwent repeat PVI in 2017.  She was noted to have only isolated pulmonary vein potentials at the time.  She underwent posterior wall isolation.  She was found to have atrial tachycardias from both the right and left atria.  She was started on dofetilide.  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  Decrease and was discontinued to due to refractory atrial fibrillation.  Today, she denies symptoms of palpitations, chest pain, shortness of breath, orthopnea, PND, lower extremity edema, claudication, dizziness, presyncope, syncope, bleeding, or neurologic sequela. The patient is tolerating medications without difficulties.  Her main symptom is fatigue.  As she has been in atrial fibrillation for quite a while, she states that she has gotten used to her level of fatigue.  Fortunately her ejection fraction has remained stable.  We did discuss the option of amiodarone but she is adamant that she does not want to try this medication.  She is okay with a rate control strategy.  Of note, her husband has lung cancer and is on hospice care.   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    Chest discomfort 12/23/2018   Chronic atrial fibrillation ([place]) 01/26/2016   Last Assessment & Plan:  Has had ablation through [facility] , her BP here was stable , discussed her current meds and the S/E , she is off of the lasix now and Edward Trevino see her CMP for her as well as CK today for her muscle cramps she has had long before her ablation with possible pain from the statin   Chronic fatigue 07/25/2016   Chronic idiopathic constipation 01/26/2016   Dysrhythmia    on toprol for fast heart beat   Encounter for care of pacemaker 12/19/2018   Essential (primary) hypertension 01/26/2016   Family history of colon cancer in mother 07/17/2017   GERD (gastroesophageal reflux disease) 07/17/2017   H/O hiatal hernia    High risk medication use 01/26/2016   History of adenomatous polyp of colon 07/17/2017   Hypercalcemia 01/26/2016   Hypothyroidism    pt. states she took radioactive pill and has no thyroid   Irregular heart rate 07/06/2018   Left shoulder pain 07/06/2018   Long term current use of anticoagulant 07/06/2018   Mixed hyperlipidemia 01/26/2016   Myalgia 05/18/2016   Last Assessment & Plan:  Formatting of this note might be different from the original. 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  Presence of left artificial knee joint 01/22/2015   Primary generalized (osteo)arthritis 01/26/2016   Primary hyperparathyroidism ([place]) 11/26/2017   Primary osteoarthritis involving multiple joints 01/26/2016   PSVT (paroxysmal supraventricular tachycardia) ([place]) 01/26/2016   Recurrent urticaria 01/26/2016   S/P TKR (total knee replacement) 12/27/2012   Shortness of breath    with exertion   Sinobronchitis 09/07/2018   Situational anxiety 09/07/2018    Thyroid function test abnormal 08/19/2016   Vitamin D deficiency 11/26/2017   Past Surgical History:  Procedure Laterality Date   BUNIONECTOMY  yrs. ago   left foot   FOOT SURGERY  2009   took knot out  and put in screws   KNEE ARTHROSCOPY W/ MENISCAL REPAIR  2009   left   TOTAL KNEE ARTHROPLASTY  01/16/2012   Procedure: TOTAL KNEE ARTHROPLASTY;  Surgeon: Rudean Haskell, MD;  Location: [facility];  Service: Orthopedics;  Laterality: Left;     Current Outpatient Medications  Medication Sig Dispense Refill   ALPRAZolam (XANAX) 0.5 MG tablet Take 0.5 mg by mouth daily as needed for anxiety or sleep.     apixaban (ELIQUIS) 5 MG TABS tablet Take 5 mg by mouth 2 (two) times daily.      cholecalciferol (VITAMIN D3) 25 MCG (1000 UNIT) tablet Take 2,000 Units by mouth daily.     EUTHYROX 100 MCG tablet 1 TABLET DAILY WITH EXTRA HALF TABLET ON SUNDAYS (Patient taking differently: daily. 1 tablet daily with extra half tablet on Sundays) 90 tablet 0   famotidine (PEPCID) 20 MG tablet Take 1 tablet by mouth in the morning and at bedtime.     furosemide (LASIX) 20 MG tablet Take 20 mg by mouth.     magnesium oxide (MAG-OX) 400 MG tablet Take 400 mg by mouth daily.     metoprolol tartrate (LOPRESSOR) 25 MG tablet Take 1 tablet (25 mg total) by mouth 2 (two) times daily. 180 tablet 3   mupirocin ointment (BACTROBAN) 2 % Apply 1 application topically as needed (wound care).     rosuvastatin (CRESTOR) 5 MG tablet Take 5 mg by mouth daily.     verapamil (CALAN) 80 MG tablet Take 1 tablet (80 mg total) by mouth 2 (two) times daily. 180 tablet 3   No current facility-administered medications for this visit.    Allergies:   Clindamycin/lincomycin, Nitrofurantoin monohyd macro, and Silver sulfadiazine   Social History:  The patient  reports that she has never smoked. She has never used smokeless tobacco. She reports that she does not drink alcohol and does not use drugs.   Family History:  The patient's  family history includes Cancer in her mother; Stroke in her father; Thyroid disease in her mother.    ROS:  Please see the history of present illness.   Otherwise, review of systems is positive for none.   All other systems are reviewed and negative.    PHYSICAL EXAM: VS:  BP 121/74   Pulse 95   Ht 5\' 6"  (1.676 m)   Wt 158 lb 9.6 oz (71.9 kg)   SpO2 98%   BMI 25.60 kg/m  , BMI Body mass index is 25.6 kg/m. GEN: Well nourished, well developed, in no acute distress  HEENT: normal  Neck: no JVD, carotid bruits, or masses Cardiac: Irregular; no murmurs, rubs, or gallops,no edema  Respiratory:  clear to auscultation bilaterally, normal work of breathing GI: soft, nontender, nondistended, + BS MS: no deformity or atrophy  Skin: warm and dry, device pocket  is well healed Neuro:  Strength and sensation are intact Psych: euthymic mood, full affect  EKG:  EKG is ordered today. Personal review of the ekg ordered shows atrial fibrillation  Device interrogation is reviewed today in detail.  See PaceArt for details.   Recent Labs: 04/27/2021: BUN 16; Creatinine, Ser 0.93; Potassium 4.7; Sodium 138 06/28/2021: TSH 0.43    Lipid Panel     Component Value Date/Time   CHOL 230 (A) 11/12/2020 0000   TRIG 88 11/12/2020 0000   HDL 95 (A) 11/12/2020 0000   LDLCALC 103 11/12/2020 0000     Wt Readings from Last 3 Encounters:  08/16/21 158 lb 9.6 oz (71.9 kg)  06/28/21 164 lb (74.4 kg)  06/23/21 161 lb (73 kg)      Other studies Reviewed: Additional studies/ records that were reviewed today include: TTE 11/11/20  Review of the above records today demonstrates:   1. Left ventricular ejection fraction, by estimation, is 65 to 70%. The  left ventricle has normal function. The left ventricle has no regional  wall motion abnormalities. There is mild concentric left ventricular  hypertrophy. Diastolic function  indeterminant due to atrial fibrillation.   2. Right ventricular systolic  function is normal. The right ventricular  size is moderately enlarged. There is mildly elevated pulmonary artery  systolic pressure. The estimated right ventricular systolic pressure is  67.5 mmHg.   3. Left atrial size was moderately dilated.   4. Right atrial size was severely dilated.   5. The mitral valve is normal in structure. Mild mitral valve  regurgitation.   6. The aortic valve is tricuspid. There is mild thickening of the aortic  valve. Aortic valve regurgitation is not visualized. No aortic stenosis is  present.   7. The inferior vena cava is normal in size with <50% respiratory  variability, suggesting right atrial pressure of 8 mmHg.    ASSESSMENT AND PLAN:  1.  Persistent atrial fibrillation: Status post ablation x2.  She has had a posterior wall isolation.  During her second ablation, pulmonary veins were isolated.  Unfortunately options are very limited.  She is adamant that she does not want to try amiodarone for therapy of her atrial fibrillation.  Due to that, we Tailynn Armetta employ a rate control strategy.  Histograms on her device show heart rates at times in the 150s to 170s.  We Diamonte Stavely stop her metoprolol and start her on Toprol-XL 100 mg daily.  This can be increased to 200 mg if she remains not well controlled.  2.  Symptomatic bradycardia: Status post Medtronic Micra.  We Alise Calais continue to follow remotes.  Case discussed with primary cardiology  Current medicines are reviewed at length with the patient today.   The patient does not have concerns regarding her medicines.  The following changes were made today:  none  Labs/ tests ordered today include:  Orders Placed This Encounter  Procedures   EKG 12-Lead     Disposition:   FU with Leander Tout 3 months  Signed, Jezebelle Ledwell Meredith Leeds, MD  08/16/2021 12:17 PM     [place] [address] [phone] (office) [phone] (fax)

## 2021-08-18 NOTE — Progress Notes (Signed)
Remote pacemaker transmission.   

## 2021-08-26 DIAGNOSIS — R2241 Localized swelling, mass and lump, right lower limb: Secondary | ICD-10-CM

## 2021-08-26 DIAGNOSIS — Z23 Encounter for immunization: Secondary | ICD-10-CM

## 2021-08-26 HISTORY — DX: Localized swelling, mass and lump, right lower limb: R22.41

## 2021-08-26 HISTORY — DX: Encounter for immunization: Z23

## 2021-09-06 ENCOUNTER — Ambulatory Visit (INDEPENDENT_AMBULATORY_CARE_PROVIDER_SITE_OTHER): Payer: Medicare Other | Admitting: Neurology

## 2021-09-06 ENCOUNTER — Encounter: Payer: Self-pay | Admitting: Neurology

## 2021-09-06 VITALS — BP 121/68 | HR 82 | Ht 66.0 in | Wt 157.6 lb

## 2021-09-06 DIAGNOSIS — R0683 Snoring: Secondary | ICD-10-CM

## 2021-09-06 DIAGNOSIS — F4321 Adjustment disorder with depressed mood: Secondary | ICD-10-CM

## 2021-09-06 DIAGNOSIS — G4719 Other hypersomnia: Secondary | ICD-10-CM | POA: Diagnosis not present

## 2021-09-06 DIAGNOSIS — I482 Chronic atrial fibrillation, unspecified: Secondary | ICD-10-CM | POA: Diagnosis not present

## 2021-09-06 DIAGNOSIS — I495 Sick sinus syndrome: Secondary | ICD-10-CM | POA: Diagnosis not present

## 2021-09-06 DIAGNOSIS — R351 Nocturia: Secondary | ICD-10-CM

## 2021-09-06 DIAGNOSIS — G478 Other sleep disorders: Secondary | ICD-10-CM

## 2021-09-06 NOTE — Patient Instructions (Signed)

## 2021-09-06 NOTE — Progress Notes (Signed)
Subjective:    Patient ID: April Padilla is a 75 y.o. female.  HPI    Star Age, MD, PhD [facility] [address] [place], [place] [address]  Dear Dr. Bettina Gavia,  I saw your patient, April Padilla, upon your kind request in my sleep clinic today for initial consultation of her sleep disorder, in particular, concern for underlying sleep apnea.  The patient is unaccompanied today.  As you know, [name] is a 75 year old right-handed woman with an underlying complex medical history of chronic atrial fibrillation, sick sinus syndrome, status post pacemaker placement, reflux disease, hypertension, hypothyroidism, PSVT, arthritis with status post total knee replacement, vitamin D deficiency, and borderline overweight state, who reports snoring and some difficulty maintaining sleep. Sadly, she recently lost her husband less than 3 weeks ago to cancer.  She is still grieving and adjusting to her loss.  She has good family support with her two sisters and her son and his family.  She is apprehensive about sleep testing and not sure that she will be able to come in the evening and stay overnight for testing but would be open to trying a home sleep test.  Her bedtime and rise time are variable but generally she used to try to be in bed between 9 and 10 when her husband was still alive and they would wake up around 7 AM.  She has not been taking any naps.  She drinks no caffeine, drinks decaf only.  She does not drink any alcohol.  She is a non-smoker.  She does have nocturia about 3 times per average night, denies recurrent morning headaches but does not wake up rested.  Her Epworth sleepiness score is 4 out of 24, fatigue severity score is 45 out of 63.  I reviewed your office note from 06/23/2021.  Her husband had sleep apnea and had a CPAP machine, she is familiar with sleep apnea and and treatment with a machine.  Her Past Medical History Is Significant  For: 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   Chest discomfort 12/23/2018   Chronic atrial fibrillation ([place]) 01/26/2016   Last Assessment & Plan:  Has had ablation through [facility] , her BP here was stable , discussed her current meds and the S/E , she is off of the lasix now and will see her CMP for her as well as CK today for her muscle cramps she has had long before her ablation with possible pain from the statin   Chronic fatigue 07/25/2016   Chronic idiopathic constipation 01/26/2016   Dysrhythmia    on toprol for fast heart beat   Encounter for care of pacemaker 12/19/2018   Essential (primary) hypertension 01/26/2016   Family history of colon cancer in mother 07/17/2017   GERD (gastroesophageal reflux disease) 07/17/2017   H/O hiatal hernia    High risk medication use 01/26/2016   History of adenomatous polyp of colon 07/17/2017   Hypercalcemia 01/26/2016   Hypothyroidism    pt. states she took radioactive pill and has no thyroid   Irregular heart rate 07/06/2018   Left shoulder pain 07/06/2018   Long term current use of anticoagulant 07/06/2018   Mixed hyperlipidemia 01/26/2016   Myalgia 05/18/2016   Last Assessment & Plan:  Formatting of this note might be different from the original. 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   Presence of left artificial knee joint 01/22/2015   Primary generalized (osteo)arthritis 01/26/2016   Primary hyperparathyroidism ([place]) 11/26/2017   Primary osteoarthritis involving multiple joints 01/26/2016   PSVT (paroxysmal supraventricular tachycardia) ([place]) 01/26/2016   Recurrent urticaria 01/26/2016   S/P TKR (total knee  replacement) 12/27/2012   Shortness of breath    with exertion   Sinobronchitis 09/07/2018   Situational anxiety 09/07/2018   Thyroid function test abnormal 08/19/2016   Vitamin D deficiency 11/26/2017    Her Past Surgical History Is Significant For: Past Surgical History:  Procedure Laterality Date   BUNIONECTOMY  yrs. ago   left foot   FOOT SURGERY  2009   took knot out  and put in screws   KNEE ARTHROSCOPY W/ MENISCAL REPAIR  2009   left   TOTAL KNEE ARTHROPLASTY  01/16/2012   Procedure: TOTAL KNEE ARTHROPLASTY;  Surgeon: Rudean Haskell, MD;  Location: [facility];  Service: Orthopedics;  Laterality: Left;    Her Family History Is Significant For: Family History  Problem Relation Age of Onset   Cancer Mother    Thyroid disease Mother    Stroke Father    Anesthesia problems Neg Hx    Hypotension Neg Hx    Malignant hyperthermia Neg Hx    Pseudochol deficiency Neg Hx    Sleep apnea Neg Hx     Her Social History Is Significant For: Social History   Socioeconomic History   Marital status: Married    Spouse name: Not on file   Number of children: Not on file   Years of education: Not on file   Highest education level: Not on file  Occupational History   Not on file  Tobacco Use   Smoking status: Never   Smokeless tobacco: Never  Substance and Sexual Activity   Alcohol use: No    Comment: occasional   Drug use: No   Sexual activity: Yes  Other Topics Concern   Not on file  Social History Narrative   Not on file   Social Determinants of Health   Financial Resource Strain: Not on file  Food Insecurity: Not on file  Transportation Needs: Not on file  Physical Activity: Not on file  Stress: Not on file  Social Connections: Not on file    Her Allergies Are:  Allergies  Allergen Reactions   Clindamycin/Lincomycin Rash   Nitrofurantoin Monohyd Macro Rash   Silver Sulfadiazine Rash  :   Her Current Medications Are:  Outpatient Encounter Medications as of  09/06/2021  Medication Sig   ALPRAZolam (XANAX) 0.5 MG tablet Take 0.5 mg by mouth daily as needed for anxiety or sleep.   apixaban (ELIQUIS) 5 MG TABS tablet Take 5 mg by mouth 2 (two) times daily.    cholecalciferol (VITAMIN D3) 25 MCG (1000 UNIT) tablet Take 2,000 Units by mouth daily.   EUTHYROX 100 MCG tablet 1 TABLET DAILY WITH EXTRA HALF TABLET ON SUNDAYS (Patient taking differently: daily. 1 tablet daily with extra half tablet on Sundays)   famotidine (PEPCID) 20 MG tablet Take 1 tablet by mouth in the morning and at bedtime.   furosemide (LASIX) 20 MG tablet Take 20 mg by mouth.   magnesium oxide (MAG-OX) 400 MG tablet Take 400 mg by mouth  daily.   metoprolol tartrate (LOPRESSOR) 25 MG tablet Take 1 tablet (25 mg total) by mouth 2 (two) times daily.   mupirocin ointment (BACTROBAN) 2 % Apply 1 application topically as needed (wound care).   rosuvastatin (CRESTOR) 5 MG tablet Take 5 mg by mouth daily.   verapamil (CALAN) 80 MG tablet Take 1 tablet (80 mg total) by mouth 2 (two) times daily.   No facility-administered encounter medications on file as of 09/06/2021.  :   Review of Systems:  Out of a complete 14 point review of systems, all are reviewed and negative with the exception of these symptoms as listed below:   Review of Systems  Neurological:        Pt is here for sleep consult . Pt states she has headaches . Pt states she snores at night  . Pt states she has  AFIB so she stays fatigue. Marland Kitchen Pt is crying her husband just past October. Pt states her PCP wanted to get the sleep study   FSS:45 ESS :4   Objective:  Neurological Exam  Physical Exam Physical Examination:   Vitals:   09/06/21 1346  BP: 121/68  Pulse: 82    General Examination: The patient is a very pleasant 75 y.o. female in no acute distress. She appears well-developed and well-nourished and well groomed.   HEENT: Normocephalic, atraumatic, pupils are equal, round and reactive to light, extraocular  tracking is good without limitation to gaze excursion or nystagmus noted. Hearing is grossly intact. Face is symmetric with normal facial animation. Speech is clear with no dysarthria noted. There is no hypophonia. There is no lip, neck/head, jaw or voice tremor. Neck is supple with full range of passive and active motion. There are no carotid bruits on auscultation. Oropharynx exam reveals: mild mouth dryness, adequate dental hygiene and mild airway crowding, due to small airway entry, tonsils on the smaller side.  Mallampati class III.  Neck circumference of 14-1/2 inches.  She does not have much in way of overbite.  Tongue protrudes centrally and palate elevates symmetrically.  Chest: Clear to auscultation without wheezing, rhonchi or crackles noted.  Heart: S1+S2+0, regular and normal without murmurs, rubs or gallops noted.   Abdomen: Soft, non-tender and non-distended.  Extremities: There is trace pitting edema in the right leg.  She wears a thigh high compression hose.  Skin: Warm and dry without trophic changes noted.   Musculoskeletal: exam reveals right knee discomfort. She is status post left knee replacement surgery.   Neurologically:  Mental status: The patient is awake, alert and oriented in all 4 spheres. Her immediate and remote memory, attention, language skills and fund of knowledge are appropriate. There is no evidence of aphasia, agnosia, apraxia or anomia. Speech is clear with normal prosody and enunciation. Thought process is linear. Mood is normal and affect is blunted.  Cranial nerves II - XII are as described above under HEENT exam.  Motor exam: Normal bulk, strength and tone is noted. There is no tremor, fine motor skills and coordination: grossly intact.  Cerebellar testing: No dysmetria or intention tremor. There is no truncal or gait ataxia.  Sensory exam: intact to light touch in the upper and lower extremities.  Gait, station and balance: She stands easily. No  veering to one side is noted. No leaning to one side is noted. Posture is age-appropriate and stance is narrow based. Gait shows a mild limp on the right.    Assessment and Plan:  In summary, Pernell  P Lacson is a very pleasant 75 y.o.-year old female with an underlying complex medical history of chronic atrial fibrillation, sick sinus syndrome, status post pacemaker placement, reflux disease, hypertension, hypothyroidism, PSVT, arthritis with status post total knee replacement, vitamin D deficiency, and borderline overweight state, whose history and physical exam are concerning for obstructive sleep apnea (OSA). Sadly, she recently lost her husband.  She is not sleeping very well which is understandable.  Thankfully, she has a good support system.  She does not foresee being able to come and stay overnight for sleep testing in the sleep lab but would be willing to pursue a home sleep test. I had a long chat with the patient and she about my findings and the diagnosis of OSA, its prognosis and treatment options. We talked about medical treatments, surgical interventions and non-pharmacological approaches. I explained in particular the risks and ramifications of untreated moderate to severe OSA, especially with respect to developing cardiovascular disease down the Road, including congestive heart failure, difficult to treat hypertension, cardiac arrhythmias, or stroke. Even type 2 diabetes has, in part, been linked to untreated OSA. Symptoms of untreated OSA include daytime sleepiness, memory problems, mood irritability and mood disorder such as depression and anxiety, lack of energy, as well as recurrent headaches, especially morning headaches. We talked about trying to maintain a healthy lifestyle in general, as well as the importance of weight control. We also talked about the importance of good sleep hygiene.  Her sleep schedule is not set very much at this time. I recommended the following at this time:  sleep study.  I outlined the difference between a laboratory attended sleep study versus home sleep test and we mutually agreed to proceed with a home sleep test at this time.  I explained the sleep test procedure to the patient and also outlined possible surgical and non-surgical treatment options of OSA, including the use of a custom-made dental device (which would require a referral to a specialist dentist or oral surgeon), upper airway surgical options, such as traditional UPPP or a novel less invasive surgical option in the form of Inspire hypoglossal nerve stimulation (which would involve a referral to an ENT surgeon). I also explained the CPAP treatment option to the patient, who indicated that she would be willing to try CPAP or AutoPap therapy if the need arises. I explained the importance of being compliant with PAP treatment, not only for insurance purposes but primarily to improve Her symptoms, and for the patient's long term health benefit, including to reduce Her cardiovascular risks. I answered all her questions today and the patient was in agreement. I plan to see her back after the sleep study is completed and encouraged her to call with any interim questions, concerns, problems or updates.   Thank you very much for allowing me to participate in the care of this nice patient. If I can be of any further assistance to you please do not hesitate to call me at [phone].  Sincerely,   Star Age, MD, PhD

## 2021-09-09 ENCOUNTER — Telehealth: Payer: Self-pay | Admitting: Neurology

## 2021-09-09 NOTE — Telephone Encounter (Signed)
Pt. Was called today to schedule her HST but pt states she would like to wait to schedule until February

## 2021-09-29 ENCOUNTER — Other Ambulatory Visit: Payer: Self-pay

## 2021-09-29 ENCOUNTER — Ambulatory Visit (INDEPENDENT_AMBULATORY_CARE_PROVIDER_SITE_OTHER): Payer: Medicare Other | Admitting: Endocrinology

## 2021-09-29 VITALS — BP 116/60 | HR 85 | Ht 66.0 in | Wt 155.2 lb

## 2021-09-29 DIAGNOSIS — E890 Postprocedural hypothyroidism: Secondary | ICD-10-CM | POA: Diagnosis not present

## 2021-09-29 DIAGNOSIS — E210 Primary hyperparathyroidism: Secondary | ICD-10-CM | POA: Diagnosis not present

## 2021-09-29 LAB — BASIC METABOLIC PANEL
BUN: 12 mg/dL (ref 6–23)
CO2: 30 mEq/L (ref 19–32)
Calcium: 10.5 mg/dL (ref 8.4–10.5)
Chloride: 102 mEq/L (ref 96–112)
Creatinine, Ser: 0.92 mg/dL (ref 0.40–1.20)
GFR: 60.06 mL/min (ref >60.00)
Glucose, Bld: 80 mg/dL (ref 70–99)
Potassium: 4.6 mEq/L (ref 3.5–5.1)
Sodium: 137 mEq/L (ref 135–145)

## 2021-09-29 LAB — T4, FREE: Free T4: 1.3 ng/dL (ref 0.60–1.60)

## 2021-09-29 LAB — TSH: TSH: 5.54 u[IU]/mL — ABNORMAL HIGH (ref 0.35–5.50)

## 2021-09-29 NOTE — Progress Notes (Signed)
Patient ID: April Padilla, female   DOB: 12-22-1945, 75 y.o.   MRN: [id number]               Reason for Appointment: Endocrinology follow-up visit    History of Present Illness:   Problem 1:  Hypothyroidism was first diagnosed in the 1990s after treatment of hyperthyroidism with radioactive iodine Details are not available She thinks that for several years she had been stable on 88 g of levothyroxine  In 07/2017 she had a routine TSH test and this was found to be high at 7.9 Subsequently her levothyroxine doses have been very well between 112 and 125 mcg   She tends to have chronic symptoms of fatigue which she thinks is from her cardiac arrhythmia Also in the last few months she may have had more hair loss She has had some shakiness of her hands for quite some time  In 6/22 when she was on 100 mcg levothyroxine her TSH was high normal at 3.8  She was changed to EUTHYROX 100 mcg, taking an extra half tablet once a week with 1 tablet on other days since 6/22 Subsequently her TSH was low normal and the extra half tablet was dropped  No unusual palpitations recently She has lost weight from stress Because of difficulty sleeping and grief she has been feeling tired  She is apparently still getting the [facility]  She is very regular with taking her levothyroxine in the morning an hour before breakfast Labs be checked today         Patient's weight history is as follows:  Wt Readings from Last 3 Encounters:  09/29/21 155 lb 3.2 oz (70.4 kg)  09/06/21 157 lb 9.6 oz (71.5 kg)  08/16/21 158 lb 9.6 oz (71.9 kg)    Thyroid function results have been as follows:  Lab Results  Component Value Date   TSH 0.43 06/28/2021   TSH 3.81 04/27/2021   TSH 1.64 12/22/2020   TSH 7.60 (A) 11/12/2020   FREET4 1.43 06/28/2021   FREET4 1.04 04/27/2021   FREET4 1.15 09/16/2020   FREET4 1.05 07/14/2020   T3FREE 2.5 09/16/2020    HYPERCALCEMIA: See review of  systems    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   Chest discomfort 12/23/2018   Chronic atrial fibrillation ([place]) 01/26/2016   Last Assessment & Plan:  Has had ablation through [facility] , her BP here was stable , discussed her current meds and the S/E , she is off of the lasix now and will see her CMP for her as well as CK today for her muscle cramps she has had long before her ablation with possible pain from the statin   Chronic fatigue 07/25/2016   Chronic idiopathic constipation 01/26/2016   Dysrhythmia    on toprol for fast heart beat   Encounter for care of pacemaker 12/19/2018   Essential (primary) hypertension 01/26/2016   Family history of colon cancer in mother 07/17/2017   GERD (gastroesophageal reflux disease) 07/17/2017   H/O hiatal hernia    High risk medication use 01/26/2016   History of adenomatous polyp of colon 07/17/2017   Hypercalcemia 01/26/2016   Hypothyroidism    pt. states she took radioactive pill and has no thyroid   Irregular heart rate 07/06/2018   Left shoulder pain 07/06/2018   Long term current use of anticoagulant 07/06/2018   Mixed hyperlipidemia 01/26/2016   Myalgia  05/18/2016   Last Assessment & Plan:  Formatting of this note might be different from the original. 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   Presence of left artificial knee joint 01/22/2015   Primary generalized (osteo)arthritis 01/26/2016   Primary hyperparathyroidism ([place]) 11/26/2017   Primary osteoarthritis involving multiple joints 01/26/2016   PSVT (paroxysmal supraventricular tachycardia) ([place]) 01/26/2016   Recurrent urticaria 01/26/2016   S/P TKR (total  knee replacement) 12/27/2012   Shortness of breath    with exertion   Sinobronchitis 09/07/2018   Situational anxiety 09/07/2018   Thyroid function test abnormal 08/19/2016   Vitamin D deficiency 11/26/2017    Past Surgical History:  Procedure Laterality Date   BUNIONECTOMY  yrs. ago   left foot   FOOT SURGERY  2009   took knot out  and put in screws   KNEE ARTHROSCOPY W/ MENISCAL REPAIR  2009   left   TOTAL KNEE ARTHROPLASTY  01/16/2012   Procedure: TOTAL KNEE ARTHROPLASTY;  Surgeon: Rudean Haskell, MD;  Location: [place] [facility];  Service: Orthopedics;  Laterality: Left;    Family History  Problem Relation Age of Onset   Cancer Mother    Thyroid disease Mother    Stroke Father    Anesthesia problems Neg Hx    Hypotension Neg Hx    Malignant hyperthermia Neg Hx    Pseudochol deficiency Neg Hx    Sleep apnea Neg Hx     Social History:  reports that she has never smoked. She has never used smokeless tobacco. She reports that she does not drink alcohol and does not use drugs.  Allergies:  Allergies  Allergen Reactions   Clindamycin/Lincomycin Rash   Nitrofurantoin Monohyd Macro Rash   Silver Sulfadiazine Rash    Allergies as of 09/29/2021       Reactions   Clindamycin/lincomycin Rash   Nitrofurantoin Monohyd Macro Rash   Silver Sulfadiazine Rash        Medication List        Accurate as of September 29, 2021 11:12 AM. If you have any questions, ask your nurse or doctor.          ALPRAZolam 0.5 MG tablet Commonly known as: XANAX Take 0.5 mg by mouth daily as needed for anxiety or sleep.   apixaban 5 MG Tabs tablet Commonly known as: ELIQUIS Take 5 mg by mouth 2 (two) times daily.   cholecalciferol 25 MCG (1000 UNIT) tablet Commonly known as: VITAMIN D3 Take 2,000 Units by mouth daily.   Euthyrox 100 MCG tablet Generic drug: levothyroxine 1 TABLET DAILY WITH EXTRA HALF TABLET ON SUNDAYS What changed: See the new instructions.   famotidine 20 MG  tablet Commonly known as: PEPCID Take 1 tablet by mouth in the morning and at bedtime.   furosemide 20 MG tablet Commonly known as: LASIX Take 20 mg by mouth.   magnesium oxide 400 MG tablet Commonly known as: MAG-OX Take 400 mg by mouth daily.   metoprolol tartrate 25 MG tablet Commonly known as: LOPRESSOR Take 1 tablet (25 mg total) by mouth 2 (two) times daily.   mupirocin ointment 2 % Commonly known as: BACTROBAN Apply 1 application topically as needed (wound care).   rosuvastatin 5  MG tablet Commonly known as: CRESTOR Take 5 mg by mouth daily.   verapamil 80 MG tablet Commonly known as: Calan Take 1 tablet (80 mg total) by mouth 2 (two) times daily.           Review of Systems  HYPERCALCEMIA: She has mild hyperparathyroidism with variable increase in calcium levels Previously had increased PTH level  also  Highest calcium has been 10.8 previously   Lab Results  Component Value Date   PTH 76 (H) 11/24/2017   CALCIUM 10.8 (H) 04/27/2021    OSTEOPENIA: In 2019 she was found to have osteopenia as follows, this was her first bone density  Bone density in 08/2020 is stable to improved, has not been on treatment    Lumbar spine L1-L4 Femoral neck (FN) 33% distal radius  T-score   -1.3 RFN: -1.6 LFN: -1.8 -2.5  Change in BMD from previous DXA test (%) Up 3.5%* Up 2.0% Down 0.5%  (*) statistically significant  In 05/2018:    Lumbar spine L1-L4 Femoral neck (FN) 33% distal radius  T-score -1.6 RFN: -1.5 LFN: -2.1 -2.5   VITAMIN D deficiency:  She was found to have a low vitamin D level of 15 in 10/2017  She is taking the OTC vitamin D3, 2000 units daily    Lab Results  Component Value Date   VD25OH 31.60 07/14/2020   VD25OH 37.79 06/17/2019   VD25OH 33.12 05/31/2018               Examination:    BP 116/60   Pulse 85   Ht 5\' 6"  (1.676 m)   Wt 155 lb 3.2 oz (70.4 kg)   SpO2 95%   BMI 25.05 kg/m     Assessment:  HYPOTHYROIDISM,  secondary to I-131 ablation of thyroid for hyperthyroidism  She has had somewhat variable thyroid levels for some time  She is having difficulty with grief related to loss of her husband and is losing weight as well as having fatigue as expected  She is taking Euthyrox brand for the consistent manufacturer Currently taking 100 mcg, 1 tablet daily  She has been regular with taking her supplements before eating in the morning   HYPERPARATHYROIDISM: Has had mild hypercalcemia with last calcium 10.8 Also has osteopenia which was stable    PLAN:   TSH to be checked today Her dose will be adjusted accordingly  Will check her calcium level Also not clear if she has had vitamin D level monitored by her PCP  There are no Patient Instructions on file for this visit.    Elayne Snare 09/29/2021, 11:12 AM     Note: This office note was prepared with Dragon voice recognition system technology. Any transcriptional errors that result from this process are unintentional.  Addendum: TSH is 0.43, she will take 7 tablets a week instead of 7-1/2 and follow-up in November

## 2021-09-30 NOTE — Progress Notes (Signed)
Please call to let patient know that the thyroid is again slightly low.  Please confirm that she has not missed any doses of levothyroxine and taking it before breakfast.  If so we will need to again add another half tablet once a week.  Calcium normal

## 2021-10-28 ENCOUNTER — Telehealth: Payer: Self-pay | Admitting: Cardiology

## 2021-10-28 NOTE — Telephone Encounter (Signed)
New message   Referral was received from Dr. Glennon Mac at [facility]. Called pt to schedule with Dr. Curt Bears as pt is a current pt of [facility]. She said her doctor wants her to see Dr. Lovena Le. She said she would like to switch providers since her doctor advised it. Would this be ok?

## 2021-11-02 ENCOUNTER — Other Ambulatory Visit: Payer: Self-pay | Admitting: Endocrinology

## 2021-11-04 ENCOUNTER — Ambulatory Visit: Payer: Medicare Other | Admitting: Endocrinology

## 2021-11-09 ENCOUNTER — Ambulatory Visit (INDEPENDENT_AMBULATORY_CARE_PROVIDER_SITE_OTHER): Payer: Medicare Other

## 2021-11-09 DIAGNOSIS — I495 Sick sinus syndrome: Secondary | ICD-10-CM

## 2021-11-09 LAB — CUP PACEART REMOTE DEVICE CHECK
Battery Remaining Longevity: 96 mo
Battery Voltage: 3.01 V
Brady Statistic AS VP Percent: 0 %
Brady Statistic AS VS Percent: 0 %
Brady Statistic RV Percent Paced: 1.38 %
Date Time Interrogation Session: 20230110155713
Implantable Pulse Generator Implant Date: 20200210
Lead Channel Impedance Value: 620 Ohm
Lead Channel Pacing Threshold Amplitude: 0.75 V
Lead Channel Pacing Threshold Pulse Width: 0.24 ms
Lead Channel Sensing Intrinsic Amplitude: 27.338 mV
Lead Channel Setting Pacing Amplitude: 1.5 V
Lead Channel Setting Pacing Pulse Width: 0.24 ms
Lead Channel Setting Sensing Sensitivity: 2 mV

## 2021-11-12 NOTE — Telephone Encounter (Signed)
That's fine

## 2021-11-12 NOTE — Telephone Encounter (Signed)
April Padilla, will you ask Dr. Lovena Le if this switch is approved?

## 2021-11-16 NOTE — Telephone Encounter (Signed)
Left patient a voicemail to let her know that her provider switch has been approved.

## 2021-11-18 NOTE — Progress Notes (Signed)
Remote pacemaker transmission.   

## 2021-12-02 ENCOUNTER — Telehealth: Payer: Self-pay

## 2021-12-02 NOTE — Telephone Encounter (Signed)
Called pt to schedule her for her HST but pt states she would like to wait and call us back when ready to schedule.

## 2021-12-28 ENCOUNTER — Other Ambulatory Visit: Payer: Self-pay

## 2021-12-28 ENCOUNTER — Ambulatory Visit (INDEPENDENT_AMBULATORY_CARE_PROVIDER_SITE_OTHER): Payer: Medicare Other | Admitting: Cardiology

## 2021-12-28 ENCOUNTER — Encounter: Payer: Self-pay | Admitting: Cardiology

## 2021-12-28 VITALS — BP 108/56 | HR 94 | Ht 66.0 in | Wt 158.8 lb

## 2021-12-28 DIAGNOSIS — I495 Sick sinus syndrome: Secondary | ICD-10-CM

## 2021-12-28 DIAGNOSIS — Z950 Presence of cardiac pacemaker: Secondary | ICD-10-CM

## 2021-12-28 DIAGNOSIS — Z7901 Long term (current) use of anticoagulants: Secondary | ICD-10-CM

## 2021-12-28 DIAGNOSIS — I482 Chronic atrial fibrillation, unspecified: Secondary | ICD-10-CM

## 2021-12-28 DIAGNOSIS — L821 Other seborrheic keratosis: Secondary | ICD-10-CM | POA: Insufficient documentation

## 2021-12-28 DIAGNOSIS — I119 Hypertensive heart disease without heart failure: Secondary | ICD-10-CM

## 2021-12-28 HISTORY — DX: Other seborrheic keratosis: L82.1

## 2021-12-28 NOTE — Patient Instructions (Signed)
Medication Instructions:  Your physician recommends that you continue on your current medications as directed. Please refer to the Current Medication list given to you today.  *If you need a refill on your cardiac medications before your next appointment, please call your pharmacy*   Lab Work: Your physician recommends that you return for lab work in: Today for Lipid panel and CMP  If you have labs (blood work) drawn today and your tests are completely normal, you will receive your results only by: MyChart Message (if you have [facility]) OR A paper copy in the mail If you have any lab test that is abnormal or we need to change your treatment, we will call you to review the results.   Testing/Procedures: NONE   Follow-Up: At [facility], you and your health needs are our priority.  As part of our continuing mission to provide you with exceptional heart care, we have created designated Provider Care Teams.  These Care Teams include your primary Cardiologist (physician) and Advanced Practice Providers (APPs -  Physician Assistants and Nurse Practitioners) who all work together to provide you with the care you need, when you need it.  We recommend signing up for the patient portal called "MyChart".  Sign up information is provided on this After Visit Summary.  MyChart is used to connect with patients for Virtual Visits (Telemedicine).  Patients are able to view lab/test results, encounter notes, upcoming appointments, etc.  Non-urgent messages can be sent to your provider as well.   To learn more about what you can do with MyChart, go to [email or web address].    Your next appointment:   9 month(s)  The format for your next appointment:   In Person  Provider:   Shirlee More, MD    Other Instructions

## 2021-12-28 NOTE — Addendum Note (Signed)
Addended by: Jerl Santos R on: 12/28/2021 11:15 AM   Modules accepted: Orders

## 2021-12-28 NOTE — Progress Notes (Signed)
[facility]   Cardiology Office Note:    Date:  12/28/2021   ID:  Loriann, Bosserman [birth year], MRN [id number]  PCP:  [name], MD  Cardiologist:  Shirlee More, MD    Referring MD: [name], MD    ASSESSMENT:    1. Chronic atrial fibrillation ([place])   2. Chronic anticoagulation   3. Sick sinus syndrome ([place])   4. Pacemaker   5. Hypertensive heart disease without heart failure    PLAN:    In order of problems listed above:  Overall doing well with her atrial fibrillation rate is controlled with combination of beta-blocker and low-dose verapamil and remains anticoagulated. Stable pacemaker device management through Dr. Norm Salt [facility] Stable she takes a low-dose loop diuretic no evidence of decompensated heart failure note she does have RV dysfunction on echocardiogram We will check labs including a lipid profile on a statin renal function potassium taking diuretic   Next appointment: 9 months   Medication Adjustments/Labs and Tests Ordered: Current medicines are reviewed at length with the patient today.  Concerns regarding medicines are outlined above.  No orders of the defined types were placed in this encounter.  No orders of the defined types were placed in this encounter.   Chief Complaint  Patient presents with   Follow-up   Atrial Fibrillation    History of Present Illness:    April Padilla is a 76 y.o. female with a hx of atrial fibrillation failing to maintain sinus rhythm despite multiple EP catheter ablations and antiarrhythmic therapy ultimately involving dofetilide, symptomatic bradycardia with a leadless ventricular pacemaker hypertension hyperlipidemia last seen 06/23/2021.Her echocardiogram at [facility] February 2020 showed normal right and left ventricular function mild mitral regurgitation mild tricuspid regurgitation.echocardiogram in my office October 2022 showed mild concentric LVH normal systolic  function EF 65 to 70% right ventricle is moderately enlarged mildly elevated pulmonary artery systolic pressure 41 mmHg normal right ventricular systolic function.  Right atrium is severely dilated left atrium is moderately dilated.  Compliance with diet, lifestyle and medications: Yes  Her life has been terribly disrupted by the unexpected death of her husband within 3 weeks of diagnosis of lung cancer.  She is still grieving From a cardiology perspective doing well she has less edema switching from diltiazem to verapamil and is not having palpitation syncope shortness of breath chest pain or edema.  She takes a low-dose of the loop diuretic and tolerates her anticoagulant without bleeding and her statin without muscle pain or weakness  She was seen at [facility] Dr. Norm Salt electrophysiologist for device follow-up in December: Procedure: Please see separate procedure note for details of the device interrogation and programming. Minimal RV pacing from her leadless device. (VP 0.5%, AM-VP 0.4%). Appears to be in longstanding persistent atrial fibrillation. Capture threshold 0.75 V at 0.24 ms. RV impedance of 600 ohms. Remaining longevity of >8 years. Measured R wave >20 mV, sensitivity 2.0 mV. Programmed VDI LRL 50, changed to VVI LRL 50 since she is always in AF.  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   Chest discomfort 12/23/2018   Chronic atrial fibrillation ([place]) 01/26/2016   Last Assessment & Plan:  Has had ablation through [facility] , her BP here was stable , discussed her current meds and the S/E , she is off of the lasix now and will see her CMP for her as well as CK  today for her muscle cramps she has had long before her ablation with possible pain from the statin   Chronic fatigue 07/25/2016   Chronic idiopathic constipation 01/26/2016   Dysrhythmia    on toprol for fast heart beat   Encounter for care of pacemaker  12/19/2018   Essential (primary) hypertension 01/26/2016   Family history of colon cancer in mother 07/17/2017   GERD (gastroesophageal reflux disease) 07/17/2017   H/O hiatal hernia    High risk medication use 01/26/2016   History of adenomatous polyp of colon 07/17/2017   Hypercalcemia 01/26/2016   Hypothyroidism    pt. states she took radioactive pill and has no thyroid   Irregular heart rate 07/06/2018   Left shoulder pain 07/06/2018   Long term current use of anticoagulant 07/06/2018   Mixed hyperlipidemia 01/26/2016   Myalgia 05/18/2016   Last Assessment & Plan:  Formatting of this note might be different from the original. 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   Presence of left artificial knee joint 01/22/2015   Primary generalized (osteo)arthritis 01/26/2016   Primary hyperparathyroidism ([place]) 11/26/2017   Primary osteoarthritis involving multiple joints 01/26/2016   PSVT (paroxysmal supraventricular tachycardia) ([place]) 01/26/2016   Recurrent urticaria 01/26/2016   S/P TKR (total knee replacement) 12/27/2012   Shortness of breath    with exertion   Sinobronchitis 09/07/2018   Situational anxiety 09/07/2018   Thyroid function test abnormal 08/19/2016   Vitamin D deficiency 11/26/2017    Past Surgical History:  Procedure Laterality Date   BUNIONECTOMY  yrs. ago   left foot   FOOT SURGERY  2009   took knot out  and put in screws   KNEE ARTHROSCOPY W/ MENISCAL REPAIR  2009   left   TOTAL KNEE ARTHROPLASTY  01/16/2012   Procedure: TOTAL KNEE ARTHROPLASTY;  Surgeon: Rudean Haskell, MD;  Location: [facility];  Service: Orthopedics;  Laterality: Left;    Current Medications: Current Meds  Medication Sig   ALPRAZolam (XANAX) 0.5  MG tablet Take 0.5 mg by mouth daily as needed for anxiety or sleep.   apixaban (ELIQUIS) 5 MG TABS tablet Take 5 mg by mouth 2 (two) times daily.    cholecalciferol (VITAMIN D3) 25 MCG (1000 UNIT) tablet Take 2,000 Units by mouth daily.   EUTHYROX 100 MCG tablet TAKE 1 TABLET BY MOUTH DAILY WITH EXTRA HALF TABLET ON SUNDAYS   famotidine (PEPCID) 20 MG tablet Take 1 tablet by mouth in the morning and at bedtime.   furosemide (LASIX) 20 MG tablet Take 20 mg by mouth.   magnesium oxide (MAG-OX) 400 MG tablet Take 400 mg by mouth daily.   metoprolol tartrate (LOPRESSOR) 25 MG tablet Take 1 tablet (25 mg total) by mouth 2 (two) times daily.   mupirocin ointment (BACTROBAN) 2 % Apply 1 application topically as needed (wound care).   rosuvastatin (CRESTOR) 5 MG tablet Take 5 mg by mouth daily.   verapamil (CALAN) 80 MG tablet Take 1 tablet (80 mg total) by mouth 2 (two) times daily.     Allergies:   Clindamycin/lincomycin, Nitrofurantoin monohyd macro, and Silver sulfadiazine   Social History   Socioeconomic History   Marital status: Married    Spouse name:  Not on file   Number of children: Not on file   Years of education: Not on file   Highest education level: Not on file  Occupational History   Not on file  Tobacco Use   Smoking status: Never   Smokeless tobacco: Never  Substance and Sexual Activity   Alcohol use: No    Comment: occasional   Drug use: No   Sexual activity: Yes  Other Topics Concern   Not on file  Social History Narrative   Not on file   Social Determinants of Health   Financial Resource Strain: Not on file  Food Insecurity: Not on file  Transportation Needs: Not on file  Physical Activity: Not on file  Stress: Not on file  Social Connections: Not on file     Family History: The patient's family history includes Cancer in her mother; Stroke in her father; Thyroid disease in her mother. There is no history of Anesthesia problems, Hypotension, Malignant  hyperthermia, Pseudochol deficiency, or Sleep apnea. ROS:   Please see the history of present illness.    All other systems reviewed and are negative.  EKGs/Labs/Other Studies Reviewed:    The following studies were reviewed today: Echocardiogram 08/10/2021: Procedure: Please see separate procedure note for details of the device interrogation and programming. Minimal RV pacing from her leadless device. (VP 0.5%, AM-VP 0.4%). Appears to be in longstanding persistent atrial fibrillation. Capture threshold 0.75 V at 0.24 ms. RV impedance of 600 ohms. Remaining longevity of >8 years. Measured R wave >20 mV, sensitivity 2.0 mV. Programmed VDI LRL 50, changed to VVI LRL 50 since she is always in AF.    Recent Labs: 09/29/2021: BUN 12; Creatinine, Ser 0.92; Potassium 4.6; Sodium 137; TSH 5.54  Recent Lipid Panel    Component Value Date/Time   CHOL 230 (A) 11/12/2020 0000   TRIG 88 11/12/2020 0000   HDL 95 (A) 11/12/2020 0000   LDLCALC 103 11/12/2020 0000    Physical Exam:    VS:  BP (!) 108/56 (BP Location: Right Arm, Patient Position: Sitting)    Pulse 94    Ht 5\' 6"  (1.676 m)    Wt 158 lb 12.8 oz (72 kg)    SpO2 96%    BMI 25.63 kg/m     Wt Readings from Last 3 Encounters:  12/28/21 158 lb 12.8 oz (72 kg)  09/29/21 155 lb 3.2 oz (70.4 kg)  09/06/21 157 lb 9.6 oz (71.5 kg)     GEN: Tearful well nourished, well developed in no acute distress HEENT: Normal NECK: No JVD; No carotid bruits LYMPHATICS: No lymphadenopathy CARDIAC: Irregular rate and rhythm no murmurs, rubs, gallops RESPIRATORY:  Clear to auscultation without rales, wheezing or rhonchi  ABDOMEN: Soft, non-tender, non-distended MUSCULOSKELETAL:  No edema; No deformity  SKIN: Warm and dry NEUROLOGIC:  Alert and oriented x 3 PSYCHIATRIC:  Normal affect    Signed, Shirlee More, MD  12/28/2021 11:00 AM    [place]

## 2021-12-29 LAB — COMPREHENSIVE METABOLIC PANEL
ALT: 18 IU/L (ref 0–32)
AST: 18 IU/L (ref 0–40)
Albumin/Globulin Ratio: 2.2 (ref 1.2–2.2)
Albumin: 4.3 g/dL (ref 3.7–4.7)
Alkaline Phosphatase: 95 IU/L (ref 44–121)
BUN/Creatinine Ratio: 13 (ref 12–28)
BUN: 13 mg/dL (ref 8–27)
Bilirubin Total: 0.5 mg/dL (ref 0.0–1.2)
CO2: 28 mmol/L (ref 20–29)
Calcium: 10.3 mg/dL (ref 8.7–10.3)
Chloride: 102 mmol/L (ref 96–106)
Creatinine, Ser: 1 mg/dL (ref 0.57–1.00)
Globulin, Total: 2 g/dL (ref 1.5–4.5)
Glucose: 88 mg/dL (ref 70–99)
Potassium: 4.2 mmol/L (ref 3.5–5.2)
Sodium: 141 mmol/L (ref 134–144)
Total Protein: 6.3 g/dL (ref 6.0–8.5)
eGFR: 58 mL/min/{1.73_m2} — ABNORMAL LOW (ref >59)

## 2021-12-29 LAB — LIPID PANEL
Chol/HDL Ratio: 2.1 ratio (ref 0.0–4.4)
Cholesterol, Total: 179 mg/dL (ref 100–199)
HDL: 87 mg/dL (ref >39)
LDL Chol Calc (NIH): 68 mg/dL (ref 0–99)
Triglycerides: 143 mg/dL (ref 0–149)
VLDL Cholesterol Cal: 24 mg/dL (ref 5–40)

## 2022-02-07 ENCOUNTER — Encounter: Payer: Self-pay | Admitting: Internal Medicine

## 2022-02-07 ENCOUNTER — Other Ambulatory Visit: Payer: Self-pay | Admitting: Endocrinology

## 2022-02-07 ENCOUNTER — Ambulatory Visit (INDEPENDENT_AMBULATORY_CARE_PROVIDER_SITE_OTHER): Payer: Medicare Other | Admitting: Internal Medicine

## 2022-02-07 VITALS — BP 124/64 | HR 112 | Ht 66.0 in | Wt 163.0 lb

## 2022-02-07 DIAGNOSIS — R001 Bradycardia, unspecified: Secondary | ICD-10-CM

## 2022-02-07 DIAGNOSIS — Z950 Presence of cardiac pacemaker: Secondary | ICD-10-CM | POA: Diagnosis not present

## 2022-02-07 DIAGNOSIS — I482 Chronic atrial fibrillation, unspecified: Secondary | ICD-10-CM | POA: Diagnosis not present

## 2022-02-07 HISTORY — DX: Bradycardia, unspecified: R00.1

## 2022-02-07 MED ORDER — VERAPAMIL HCL 120 MG PO TABS: 120.0000 mg | ORAL_TABLET | Freq: Two times a day (BID) | ORAL | 3 refills | Status: DC

## 2022-02-07 NOTE — Progress Notes (Signed)
? ? ? ? ?HPI ?April Padilla is referred by Dr. Marcello Moores for evaluation and managemen of atrial fib. She is a pleasant 76 yo woman with a long h/o atrial fib. She has undergone 2 catheter ablations and has in the past been treated with dofetilide. She appears to have initially undergone cryoablation in 2016, and then was treated with flecainide, propafenone, Multaq and popafenone. She had repeat PVI at [facility] in 2017. She developed symptomatic bradycardia and underwent Micra insertion. She has continued to have atrial fib. She notes dyspnea with exertion. She also notes some fatigue.  She has been offered amiodarone in the past but has refused this medication. She is currently being treated with a strategy of rate control with both metoprolol and verapamil. She has minimal palpitations but she gets tired quickly.  ?Allergies  ?Allergen Reactions  ? Clindamycin/Lincomycin Rash  ? Nitrofurantoin Monohyd Macro Rash  ? Silver Sulfadiazine Rash  ? ? ? ?Current Outpatient Medications  ?Medication Sig Dispense Refill  ? ALPRAZolam (XANAX) 0.5 MG tablet Take 0.5 mg by mouth daily as needed for anxiety or sleep.    ? apixaban (ELIQUIS) 5 MG TABS tablet Take 5 mg by mouth 2 (two) times daily.     ? cholecalciferol (VITAMIN D3) 25 MCG (1000 UNIT) tablet Take 2,000 Units by mouth daily.    ? EUTHYROX 100 MCG tablet TAKE 1 TABLET BY MOUTH DAILY WITH EXTRA HALF TABLET ON SUNDAYS 90 tablet 0  ? famotidine (PEPCID) 20 MG tablet Take 1 tablet by mouth in the morning and at bedtime.    ? furosemide (LASIX) 20 MG tablet Take 20 mg by mouth.    ? magnesium oxide (MAG-OX) 400 MG tablet Take 400 mg by mouth daily.    ? metoprolol tartrate (LOPRESSOR) 25 MG tablet Take 1 tablet (25 mg total) by mouth 2 (two) times daily. 180 tablet 3  ? mupirocin ointment (BACTROBAN) 2 % Apply 1 application topically as needed (wound care).    ? rosuvastatin (CRESTOR) 5 MG tablet Take 5 mg by mouth daily.    ? verapamil (CALAN) 120 MG tablet Take 1 tablet (120  mg total) by mouth 2 (two) times daily. 180 tablet 3  ? ?No current facility-administered medications for this visit.  ? ? ? ?Past Medical History:  ?Diagnosis Date  ? 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  ? Chest discomfort 12/23/2018  ? Chronic atrial fibrillation ([place]) 01/26/2016  ? Last Assessment & Plan:  Has had ablation through [facility] , her BP here was stable , discussed her current meds and the S/E , she is off of the lasix now and will see her CMP for her as well as CK today for her muscle cramps she has had long before her ablation with possible pain from the statin  ? Chronic fatigue 07/25/2016  ? Chronic idiopathic constipation 01/26/2016  ? Dysrhythmia   ? on toprol for fast heart beat  ? Encounter for care of pacemaker 12/19/2018  ? Essential (primary) hypertension 01/26/2016  ? Family history of colon cancer in mother 07/17/2017  ? GERD (gastroesophageal reflux disease) 07/17/2017  ? H/O hiatal hernia   ? High risk medication use 01/26/2016  ? History of adenomatous polyp of colon 07/17/2017  ? Hypercalcemia 01/26/2016  ? Hypothyroidism   ? pt. states she took radioactive pill and has no thyroid  ? Irregular heart rate 07/06/2018  ? Left shoulder pain 07/06/2018  ? Long term current use of anticoagulant 07/06/2018  ?  Mixed hyperlipidemia 01/26/2016  ? Myalgia 05/18/2016  ? Last Assessment & Plan:  Formatting of this note might be different from the original. Will update her CMP for her and ensure that her potassium and her magnesium are stable for her with the recent myalgias and fatigue post procedure for her  ? On continuous oral anticoagulation 08/09/2016  ? On dofetilide therapy 01/29/2018  ? Pain of left hip joint 11/22/2018  ? 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 Presence of left artificial knee joint 01/22/2015  ? Primary generalized (osteo)arthritis 01/26/2016  ? Primary hyperparathyroidism ([employer]) 11/26/2017  ? Primary osteoarthritis involving multiple joints 01/26/2016  ? PSVT  (paroxysmal supraventricular tachycardia) ([place]) 01/26/2016  ? Recurrent urticaria 01/26/2016  ? S/P TKR (total knee replacement) 12/27/2012  ? Shortness of breath   ? with exertion  ? Sinobronchitis 09/07/2018  ? Situational anxiety 09/07/2018  ? Thyroid function test abnormal 08/19/2016  ? Vitamin D deficiency 11/26/2017  ? ? ?ROS: ? ? All systems reviewed and negative except as noted in the HPI. ? ? ?Past Surgical History:  ?Procedure Laterality Date  ? BUNIONECTOMY  yrs. ago  ? left foot  ? FOOT SURGERY  2009  ? took knot out  and put in screws  ? KNEE ARTHROSCOPY W/ MENISCAL REPAIR  2009  ? left  ? TOTAL KNEE ARTHROPLASTY  01/16/2012  ? Procedure: TOTAL KNEE ARTHROPLASTY;  Surgeon: Rudean Haskell, MD;  Location: [place];  Service: Orthopedics;  Laterality: Left;  ? ? ? ?Family History  ?Problem Relation Age of Onset  ? Cancer Mother   ? Thyroid disease Mother   ? Stroke Father   ? Anesthesia problems Neg Hx   ? Hypotension Neg Hx   ? Malignant hyperthermia Neg Hx   ? Pseudochol deficiency Neg Hx   ? Sleep apnea Neg Hx   ? ? ? ?Social History  ? ?Socioeconomic History  ? Marital status: Married  ?  Spouse name: Not on file  ? Number of children: Not on file  ? Years of education: Not on file  ? Highest education level: Not on file  ?Occupational History  ? Not on file  ?Tobacco Use  ? Smoking status: Never  ? Smokeless tobacco: Never  ?Substance and Sexual Activity  ? Alcohol use: No  ?  Comment: occasional  ? Drug use: No  ? Sexual activity: Yes  ?Other Topics Concern  ? Not on file  ?Social History Narrative  ? Not on file  ? ?Social Determinants of Health  ? ?Financial Resource Strain: Not on file  ?Food Insecurity: Not on file  ?Transportation Needs: Not on file  ?Physical Activity: Not on file  ?Stress: Not on file  ?Social Connections: Not on file  ?Intimate Partner Violence: Not on file  ? ? ? ?BP 124/64   Pulse (!) 112   Ht '5\' 6"'$  (1.676 m)   Wt 163 lb (73.9 kg)   SpO2 95%   BMI 26.31 kg/m?  ? ?Physical  Exam: ? ?Well appearing NAD ?HEENT: Unremarkable ?Neck:  No JVD, no thyromegally ?Lymphatics:  No adenopathy ?Back:  No CVA tenderness ?Lungs:  Clear with no wheezes ?HEART:  IRegular tachy rate and rhythm, no murmurs, no rubs, no clicks ?Abd:  soft, positive bowel sounds, no organomegally, no rebound, no guarding ?Ext:  2 plus pulses, no edema, no cyanosis, no clubbing ?Skin:  No rashes no nodules ?Neuro:  CN II through XII intact, motor grossly intact ? ?EKG - reviewed.  ? ?  DEVICE  ?Normal device function.  See PaceArt for details.  ? ?Assess/Plan:  ?Atrial fib - her VR is still not well controlled. I have reviewed her Micra historgrams and her VR is over 100/min about 1/3 of the time. I have recommended she uptitrated the verapamil to 120 mg twice daily.  ?Coags - she will continue her Eliquis.  ?Chronic diastolic heart failure - her symptoms are class 2. Hopefully she will improve with better rate control. ? HTN - her bp is fairly well controlled. We will follow. ? ?Carleene Overlie Nobel Brar,MD ?

## 2022-02-07 NOTE — Patient Instructions (Addendum)
Medication Instructions:  ?Your physician has recommended you make the following change in your medication:  ? ? INCREASE your verapamil-  Take 120 mg tablet by mouth TWICE a day ? ?Labwork: ?None ordered. ? ?Testing/Procedures: ?None ordered. ? ?Follow-Up: ?Your physician wants you to follow-up in: 3-4 months with Cristopher Peru, MD  ? ?Remote monitoring is used to monitor your Pacemaker from home. This monitoring reduces the number of office visits required to check your device to one time per year. It allows [place] to keep an eye on the functioning of your device to ensure it is working properly. You are scheduled for a device check from home on 05/10/2022. You may send your transmission at any time that day. If you have a wireless device, the transmission will be sent automatically. After your physician reviews your transmission, you will receive a postcard with your next transmission date. ? ?Any Other Special Instructions Will Be Listed Below (If Applicable). ? ?If you need a refill on your cardiac medications before your next appointment, please call your pharmacy.  ? ?Important Information About Sugar ? ? ? ? ? ? ? ?

## 2022-02-10 ENCOUNTER — Ambulatory Visit (INDEPENDENT_AMBULATORY_CARE_PROVIDER_SITE_OTHER): Payer: Medicare Other

## 2022-02-10 ENCOUNTER — Telehealth: Payer: Self-pay | Admitting: Internal Medicine

## 2022-02-10 DIAGNOSIS — I495 Sick sinus syndrome: Secondary | ICD-10-CM

## 2022-02-10 LAB — CUP PACEART REMOTE DEVICE CHECK
Battery Remaining Longevity: 96 mo
Battery Voltage: 3 V
Brady Statistic AS VP Percent: 0 %
Brady Statistic AS VS Percent: 0 %
Brady Statistic RV Percent Paced: 1.86 %
Date Time Interrogation Session: 20230413153613
Implantable Pulse Generator Implant Date: 20200210
Lead Channel Impedance Value: 600 Ohm
Lead Channel Pacing Threshold Amplitude: 0.75 V
Lead Channel Pacing Threshold Pulse Width: 0.24 ms
Lead Channel Sensing Intrinsic Amplitude: 22.05 mV
Lead Channel Setting Pacing Amplitude: 1.5 V
Lead Channel Setting Pacing Pulse Width: 0.24 ms
Lead Channel Setting Sensing Sensitivity: 2 mV

## 2022-02-10 MED ORDER — METOPROLOL SUCCINATE ER 25 MG PO TB24
25.0000 mg | ORAL_TABLET | Freq: Every day | ORAL | 3 refills | Status: DC
Start: 2022-02-10 — End: 2022-11-02

## 2022-02-10 NOTE — Telephone Encounter (Signed)
STAT if HR is under 50 or over 120 ?(normal HR is 60-100 beats per minute) ? ?What is your heart rate? 52 to 64 ? ?Do you have a log of your heart rate readings (document readings)? no ? ?Do you have any other symptoms? Kind of hard to walk, feels tired. Her BP is 87/60, 103/72.  She states her verapamil was recently increased from '80mg'$  to '120mg'$ .  She is wondering if that is causing her HR to be so slow.  ?

## 2022-02-10 NOTE — Telephone Encounter (Signed)
Spoke with pt and is feeling weak since yesterday "can hardly walk" Pt sent in transmission from  device Pt also notes lower B/P since increasing Verapamil from 80 mg to 120 mg bid .Oda Kilts PA reviewed normal device  transmission and wants pt to change Metoprolol Tartrate 25 mg bid  to Metoprolol Succinate 25 mg at bedtime  Pt aware of med change and to call Monday if no improvement and send another device transmission ./cy

## 2022-02-14 DIAGNOSIS — I495 Sick sinus syndrome: Secondary | ICD-10-CM

## 2022-02-14 HISTORY — DX: Sick sinus syndrome: I49.5

## 2022-02-25 NOTE — Progress Notes (Signed)
Remote pacemaker transmission.   

## 2022-03-17 ENCOUNTER — Other Ambulatory Visit: Payer: Self-pay | Admitting: Endocrinology

## 2022-03-29 ENCOUNTER — Ambulatory Visit (INDEPENDENT_AMBULATORY_CARE_PROVIDER_SITE_OTHER): Payer: Medicare Other | Admitting: Endocrinology

## 2022-03-29 ENCOUNTER — Encounter: Payer: Self-pay | Admitting: Endocrinology

## 2022-03-29 VITALS — BP 118/60 | HR 66 | Ht 66.0 in | Wt 162.6 lb

## 2022-03-29 DIAGNOSIS — E890 Postprocedural hypothyroidism: Secondary | ICD-10-CM | POA: Diagnosis not present

## 2022-03-29 LAB — T4, FREE: Free T4: 1.56 ng/dL (ref 0.60–1.60)

## 2022-03-29 LAB — TSH: TSH: 1.13 u[IU]/mL (ref 0.35–5.50)

## 2022-03-29 NOTE — Progress Notes (Signed)
Please call to let patient know that the lab results are normal and no further action needed

## 2022-03-29 NOTE — Progress Notes (Signed)
Patient ID: April Padilla, female   DOB: 10/02/1946, 76 y.o.   MRN: [id number]               Reason for Appointment: Endocrinology follow-up visit    History of Present Illness:   Problem 1:  Hypothyroidism was first diagnosed in the 1990s after treatment of hyperthyroidism with radioactive iodine Details are not available She thinks that for several years she had been stable on 88 g of levothyroxine  In 07/2017 she had a routine TSH test and this was found to be high at 7.9 Subsequently her levothyroxine doses have been very well between 112 and 125 mcg   She tends to have chronic symptoms of fatigue which she thinks is from her cardiac arrhythmia Also in the last few months she may have had more hair loss She has had some shakiness of her hands for quite some time  In 6/22 when she was on 100 mcg levothyroxine her TSH was high normal at 3.8  She was changed to EUTHYROX 100 mcg Subsequently her dose has varied between 7 tablets a week and 7-1/2 tablets a week With her TSH going up in 11/22 she was told to add back her half tablet extra on Sundays  She has overall less fatigue, sleeping better at night and is dealing with her stress better She had previously lost weight and has gained some back No palpitations recently  She is still getting [facility]  She is very regular with taking her levothyroxine in the morning an hour before breakfast with water only Labs be checked today         Patient's weight history is as follows:  Wt Readings from Last 3 Encounters:  03/29/22 162 lb 9.6 oz (73.8 kg)  02/07/22 163 lb (73.9 kg)  12/28/21 158 lb 12.8 oz (72 kg)    Thyroid function results have been as follows:  Lab Results  Component Value Date   TSH 5.54 (H) 09/29/2021   TSH 0.43 06/28/2021   TSH 3.81 04/27/2021   TSH 1.64 12/22/2020   FREET4 1.30 09/29/2021   FREET4 1.43 06/28/2021   FREET4 1.04 04/27/2021   FREET4 1.15 09/16/2020    T3FREE 2.5 09/16/2020    HYPERCALCEMIA: See review of systems    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   Chest discomfort 12/23/2018   Chronic atrial fibrillation ([place]) 01/26/2016   Last Assessment & Plan:  Has had ablation through [facility] , her BP here was stable , discussed her current meds and the S/E , she is off of the lasix now and will see her CMP for her as well as CK today for her muscle cramps she has had long before her ablation with possible pain from the statin   Chronic fatigue 07/25/2016   Chronic idiopathic constipation 01/26/2016   Dysrhythmia    on toprol for fast heart beat   Encounter for care of pacemaker 12/19/2018   Essential (primary) hypertension 01/26/2016   Family history of colon cancer in mother 07/17/2017   GERD (gastroesophageal reflux disease) 07/17/2017   H/O hiatal hernia    High risk medication use 01/26/2016   History of adenomatous polyp of colon 07/17/2017   Hypercalcemia 01/26/2016   Hypothyroidism    pt. states she took radioactive pill and has no thyroid   Irregular heart rate 07/06/2018   Left shoulder pain 07/06/2018   Long term  current use of anticoagulant 07/06/2018   Mixed hyperlipidemia 01/26/2016   Myalgia 05/18/2016   Last Assessment & Plan:  Formatting of this note might be different from the original. 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   Presence of left artificial knee joint 01/22/2015   Primary generalized (osteo)arthritis 01/26/2016   Primary hyperparathyroidism ([place]) 11/26/2017   Primary osteoarthritis involving multiple joints 01/26/2016   PSVT (paroxysmal supraventricular tachycardia) ([place])  01/26/2016   Recurrent urticaria 01/26/2016   S/P TKR (total knee replacement) 12/27/2012   Shortness of breath    with exertion   Sinobronchitis 09/07/2018   Situational anxiety 09/07/2018   Thyroid function test abnormal 08/19/2016   Vitamin D deficiency 11/26/2017    Past Surgical History:  Procedure Laterality Date   BUNIONECTOMY  yrs. ago   left foot   FOOT SURGERY  2009   took knot out  and put in screws   KNEE ARTHROSCOPY W/ MENISCAL REPAIR  2009   left   TOTAL KNEE ARTHROPLASTY  01/16/2012   Procedure: TOTAL KNEE ARTHROPLASTY;  Surgeon: Rudean Haskell, MD;  Location: [place];  Service: Orthopedics;  Laterality: Left;    Family History  Problem Relation Age of Onset   Cancer Mother    Thyroid disease Mother    Stroke Father    Anesthesia problems Neg Hx    Hypotension Neg Hx    Malignant hyperthermia Neg Hx    Pseudochol deficiency Neg Hx    Sleep apnea Neg Hx     Social History:  reports that she has never smoked. She has never used smokeless tobacco. She reports that she does not drink alcohol and does not use drugs.  Allergies:  Allergies  Allergen Reactions   Clindamycin/Lincomycin Rash   Nitrofurantoin Monohyd Macro Rash   Silver Sulfadiazine Rash    Allergies as of 03/29/2022       Reactions   Clindamycin/lincomycin Rash   Nitrofurantoin Monohyd Macro Rash   Silver Sulfadiazine Rash        Medication List        Accurate as of Mar 29, 2022 10:53 AM. If you have any questions, ask your nurse or doctor.          ALPRAZolam 0.5 MG tablet Commonly known as: XANAX Take 0.5 mg by mouth daily as needed for anxiety or sleep.   apixaban 5 MG Tabs tablet Commonly known as: ELIQUIS Take 5 mg by mouth 2 (two) times daily.   cholecalciferol 25 MCG (1000 UNIT) tablet Commonly known as: VITAMIN D3 Take 2,000 Units by mouth daily.   Euthyrox 100 MCG tablet Generic drug: levothyroxine TAKE 1 TABLET BY MOUTH DAILY WITH EXTRA HALF TABLET ON SUNDAYS    famotidine 20 MG tablet Commonly known as: PEPCID Take 1 tablet by mouth in the morning and at bedtime.   furosemide 20 MG tablet Commonly known as: LASIX Take 20 mg by mouth.   magnesium oxide 400 MG tablet Commonly known as: MAG-OX Take 400 mg by mouth daily.   metoprolol succinate 25 MG 24 hr tablet Commonly known as: TOPROL-XL Take 1 tablet (25 mg total) by mouth at bedtime.   mupirocin ointment 2 % Commonly known as: BACTROBAN Apply 1  application topically as needed (wound care).   rosuvastatin 5 MG tablet Commonly known as: CRESTOR Take 5 mg by mouth daily.   verapamil 120 MG tablet Commonly known as: Calan Take 1 tablet (120 mg total) by mouth 2 (two) times daily.           Review of Systems  HYPERCALCEMIA: She has mild hyperparathyroidism with variable increase in calcium levels Previously had increased PTH level  also  Highest calcium has been 10.8 previously Recently normal at 10.4 with labs done by PCP   Lab Results  Component Value Date   PTH 76 (H) 11/24/2017   CALCIUM 10.3 12/28/2021    OSTEOPENIA: In 2019 she was found to have osteopenia as follows, this was her first bone density  Bone density in 08/2020 is stable to improved, has not been on any medications for this    Lumbar spine L1-L4 Femoral neck (FN) 33% distal radius  T-score   -1.3 RFN: -1.6 LFN: -1.8 -2.5  Change in BMD from previous DXA test (%) Up 3.5%* Up 2.0% Down 0.5%  (*) statistically significant  In 05/2018:    Lumbar spine L1-L4 Femoral neck (FN) 33% distal radius  T-score -1.6 RFN: -1.5 LFN: -2.1 -2.5   VITAMIN D deficiency:  She was found to have a low vitamin D level of 15 in 10/2017  She is taking OTC vitamin D3, 2000 units daily    Lab Results  Component Value Date   VD25OH 31.60 07/14/2020   VD25OH 37.79 06/17/2019   VD25OH 33.12 05/31/2018               Examination:    BP 118/60   Pulse 66   Ht '5\' 6"'$  (1.676 m)   Wt 162 lb 9.6 oz (73.8 kg)    SpO2 92%   BMI 26.24 kg/m   Biceps reflexes difficult to elicit  Assessment:  HYPOTHYROIDISM, secondary to I-131 ablation of thyroid for hyperthyroidism  She has had somewhat variable thyroid levels for some time  She is having difficulty with grief related to loss of her husband and is losing weight as well as having fatigue as expected  She is taking Euthyrox brand which she has been able to get coverage for Currently taking 100 mcg, 1 tablet daily with extra half tablet on Sundays She does not have any unusual fatigue or unexpected weight change  She has been regular with taking her Euthyrox before breakfast as before   HYPERPARATHYROIDISM: Has had mild hypercalcemia with last calcium normal done by PCP Also has osteopenia     PLAN:   TSH to be checked today Her dose will be adjusted accordingly  Bone density will be ordered on the next visit to follow-up Discussed again effects of hyperparathyroidism on bone density  There are no Patient Instructions on file for this visit.    Elayne Snare 03/29/2022, 10:53 AM     Note: This office note was prepared with Dragon voice recognition system technology. Any transcriptional errors that result from this process are unintentional.

## 2022-05-10 ENCOUNTER — Ambulatory Visit (INDEPENDENT_AMBULATORY_CARE_PROVIDER_SITE_OTHER): Payer: Medicare Other | Admitting: Internal Medicine

## 2022-05-10 ENCOUNTER — Encounter: Payer: Self-pay | Admitting: Internal Medicine

## 2022-05-10 VITALS — BP 108/74 | HR 86 | Ht 66.0 in | Wt 158.0 lb

## 2022-05-10 DIAGNOSIS — R001 Bradycardia, unspecified: Secondary | ICD-10-CM

## 2022-05-10 DIAGNOSIS — Z950 Presence of cardiac pacemaker: Secondary | ICD-10-CM | POA: Diagnosis not present

## 2022-05-10 DIAGNOSIS — I482 Chronic atrial fibrillation, unspecified: Secondary | ICD-10-CM

## 2022-05-10 NOTE — Progress Notes (Signed)
HPI [name] is referred by Dr. Marcello Moores for evaluation and managemen of atrial fib. She is a pleasant 76 yo woman with a long h/o atrial fib. She has undergone 2 catheter ablations and has in the past been treated with dofetilide. She appears to have initially undergone cryoablation in 2016, and then was treated with flecainide, propafenone, Multaq and popafenone. She had repeat PVI at [facility] in 2017. She developed symptomatic bradycardia and underwent Micra insertion. She has continued to have atrial fib. She notes dyspnea with exertion. She also notes some fatigue.  She has been offered amiodarone in the past but has refused this medication. She is currently being treated with a strategy of rate control with both metoprolol and verapamil. She has minimal palpitations but she gets tired quickly.  Allergies  Allergen Reactions   Clindamycin/Lincomycin Rash   Nitrofurantoin Monohyd Macro Rash   Silver Sulfadiazine Rash     Current Outpatient Medications  Medication Sig Dispense Refill   ALPRAZolam (XANAX) 0.5 MG tablet Take 0.5 mg by mouth daily as needed for anxiety or sleep.     apixaban (ELIQUIS) 5 MG TABS tablet Take 5 mg by mouth 2 (two) times daily.      cholecalciferol (VITAMIN D3) 25 MCG (1000 UNIT) tablet Take 2,000 Units by mouth daily.     EUTHYROX 100 MCG tablet TAKE 1 TABLET BY MOUTH DAILY WITH EXTRA HALF TABLET ON SUNDAYS 90 tablet 0   famotidine (PEPCID) 20 MG tablet Take 1 tablet by mouth in the morning and at bedtime.     furosemide (LASIX) 20 MG tablet Take 20 mg by mouth.     magnesium oxide (MAG-OX) 400 MG tablet Take 400 mg by mouth daily.     metoprolol succinate (TOPROL-XL) 25 MG 24 hr tablet Take 1 tablet (25 mg total) by mouth at bedtime. 90 tablet 3   mupirocin ointment (BACTROBAN) 2 % Apply 1 application topically as needed (wound care).     rosuvastatin (CRESTOR) 5 MG tablet Take 5 mg by mouth daily.     verapamil (CALAN) 120 MG tablet Take 1 tablet (120 mg  total) by mouth 2 (two) times daily. 180 tablet 3   No current facility-administered medications for this visit.     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   Chest discomfort 12/23/2018   Chronic atrial fibrillation ([place]) 01/26/2016   Last Assessment & Plan:  Has had ablation through [facility] , her BP here was stable , discussed her current meds and the S/E , she is off of the lasix now and will see her CMP for her as well as CK today for her muscle cramps she has had long before her ablation with possible pain from the statin   Chronic fatigue 07/25/2016   Chronic idiopathic constipation 01/26/2016   Dysrhythmia    on toprol for fast heart beat   Encounter for care of pacemaker 12/19/2018   Essential (primary) hypertension 01/26/2016   Family history of colon cancer in mother 07/17/2017   GERD (gastroesophageal reflux disease) 07/17/2017   H/O hiatal hernia    High risk medication use 01/26/2016   History of adenomatous polyp of colon 07/17/2017   Hypercalcemia 01/26/2016   Hypothyroidism    pt. states she took radioactive pill and has no thyroid   Irregular heart rate 07/06/2018   Left shoulder pain 07/06/2018   Long term current use of anticoagulant 07/06/2018  Mixed hyperlipidemia 01/26/2016   Myalgia 05/18/2016   Last Assessment & Plan:  Formatting of this note might be different from the original. 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   Presence of left artificial knee joint 01/22/2015   Primary generalized (osteo)arthritis 01/26/2016   Primary hyperparathyroidism ([place]) 11/26/2017   Primary osteoarthritis involving multiple joints 01/26/2016   PSVT  (paroxysmal supraventricular tachycardia) ([place]) 01/26/2016   Recurrent urticaria 01/26/2016   S/P TKR (total knee replacement) 12/27/2012   Shortness of breath    with exertion   Sinobronchitis 09/07/2018   Situational anxiety 09/07/2018   Thyroid function test abnormal 08/19/2016   Vitamin D deficiency 11/26/2017    ROS:   All systems reviewed and negative except as noted in the HPI.   Past Surgical History:  Procedure Laterality Date   BUNIONECTOMY  yrs. ago   left foot   FOOT SURGERY  2009   took knot out  and put in screws   KNEE ARTHROSCOPY W/ MENISCAL REPAIR  2009   left   TOTAL KNEE ARTHROPLASTY  01/16/2012   Procedure: TOTAL KNEE ARTHROPLASTY;  Surgeon: Rudean Haskell, MD;  Location: [place];  Service: Orthopedics;  Laterality: Left;     Family History  Problem Relation Age of Onset   Cancer Mother    Thyroid disease Mother    Stroke Father    Anesthesia problems Neg Hx    Hypotension Neg Hx    Malignant hyperthermia Neg Hx    Pseudochol deficiency Neg Hx    Sleep apnea Neg Hx      Social History   Socioeconomic History   Marital status: Married    Spouse name: Not on file   Number of children: Not on file   Years of education: Not on file   Highest education level: Not on file  Occupational History   Not on file  Tobacco Use   Smoking status: Never   Smokeless tobacco: Never  Substance and Sexual Activity   Alcohol use: No    Comment: occasional   Drug use: No   Sexual activity: Yes  Other Topics Concern   Not on file  Social History Narrative   Not on file   Social Determinants of Health   Financial Resource Strain: Not on file  Food Insecurity: Not on file  Transportation Needs: Not on file  Physical Activity: Insufficiently Active (11/24/2017)   Exercise Vital Sign    Days of Exercise per Week: 2 days    Minutes of Exercise per Session: 20 min  Stress: Not on file  Social Connections: Not on file  Intimate Partner Violence: Not on file      BP 108/74   Pulse 86   Ht '5\' 6"'$  (1.676 m)   Wt 158 lb (71.7 kg)   SpO2 97%   BMI 25.50 kg/m   Physical Exam:  Well appearing NAD HEENT: Unremarkable Neck:  No JVD, no thyromegally Lymphatics:  No adenopathy Back:  No CVA tenderness Lungs:  Clear HEART:  Regular rate rhythm, no murmurs, no rubs, no clicks Abd:  soft, positive bowel sounds, no organomegally, no rebound, no guarding Ext:  2 plus pulses, no edema, no cyanosis, no clubbing Skin:  No rashes no nodules Neuro:  CN II through XII intact, motor grossly intact  EKG  DEVICE  Normal device function.  See PaceArt for details.   Assess/Plan:   Atrial fib - her VR is under better control. I have reviewed her Micra historgrams and her VR is over 100/min about 1/5 of the time. I have recommended she continue the verapamil to 120 mg twice daily.  Coags - she will continue her Eliquis.  Chronic diastolic heart failure - her symptoms are class 2. Hopefully she will improve with better rate control.  HTN - her bp is fairly well controlled. We will follow. 5. Preop eval - she is pending right knee surgery. She is an acceptable surgical risk. Ok to hold the eliquis for 3 days before surgery and restart when surgical risk is acceptable.   Carleene Overlie Shafter Jupin,MD

## 2022-05-10 NOTE — Patient Instructions (Addendum)
Medication Instructions:  Your physician recommends that you continue on your current medications as directed. Please refer to the Current Medication list given to you today.  Labwork: None ordered.  Testing/Procedures: None ordered.  Follow-Up: Your physician wants you to follow-up in: 6 months with Cristopher Peru, MD or one of the following Advanced Practice Providers on your designated Care Team:   Tommye Standard, [place] Legrand Como "Jonni Sanger" Chalmers Cater, [place]  Remote monitoring is used to monitor your Pacemaker from home. This monitoring reduces the number of office visits required to check your device to one time per year. It allows [place] to keep an eye on the functioning of your device to ensure it is working properly. You are scheduled for a device check from home on 05/12/2022. You may send your transmission at any time that day. If you have a wireless device, the transmission will be sent automatically. After your physician reviews your transmission, you will receive a postcard with your next transmission date.  Any Other Special Instructions Will Be Listed Below (If Applicable).  If you need a refill on your cardiac medications before your next appointment, please call your pharmacy.   Important Information About Sugar

## 2022-05-12 ENCOUNTER — Ambulatory Visit (INDEPENDENT_AMBULATORY_CARE_PROVIDER_SITE_OTHER): Payer: Medicare Other

## 2022-05-12 DIAGNOSIS — I495 Sick sinus syndrome: Secondary | ICD-10-CM | POA: Diagnosis not present

## 2022-05-13 LAB — CUP PACEART REMOTE DEVICE CHECK
Battery Remaining Longevity: 96 mo
Battery Voltage: 3 V
Brady Statistic AS VP Percent: 0 %
Brady Statistic AS VS Percent: 0 %
Brady Statistic RV Percent Paced: 5.91 %
Date Time Interrogation Session: 20230714165113
Implantable Pulse Generator Implant Date: 20200210
Lead Channel Impedance Value: 590 Ohm
Lead Channel Pacing Threshold Amplitude: 0.625 V
Lead Channel Pacing Threshold Pulse Width: 0.24 ms
Lead Channel Sensing Intrinsic Amplitude: 20.588 mV
Lead Channel Setting Pacing Amplitude: 1.5 V
Lead Channel Setting Pacing Pulse Width: 0.24 ms
Lead Channel Setting Sensing Sensitivity: 2 mV

## 2022-05-27 NOTE — Progress Notes (Signed)
Remote pacemaker transmission.   

## 2022-06-06 ENCOUNTER — Telehealth: Payer: Self-pay | Admitting: Internal Medicine

## 2022-06-06 NOTE — Telephone Encounter (Signed)
Patient states that she got a letter from the device clinic regarding her transmission results. She would like to speak with someone regarding these, please advise

## 2022-06-06 NOTE — Telephone Encounter (Signed)
I spoke with the patient and answered her questions.

## 2022-06-06 NOTE — Telephone Encounter (Signed)
Please call [phone]

## 2022-06-27 ENCOUNTER — Other Ambulatory Visit: Payer: Self-pay | Admitting: Cardiology

## 2022-06-27 DIAGNOSIS — Z950 Presence of cardiac pacemaker: Secondary | ICD-10-CM

## 2022-06-27 DIAGNOSIS — I482 Chronic atrial fibrillation, unspecified: Secondary | ICD-10-CM

## 2022-07-01 ENCOUNTER — Telehealth: Payer: Self-pay | Admitting: Internal Medicine

## 2022-07-01 NOTE — Telephone Encounter (Signed)
   Pre-operative Risk Assessment    Patient Name: April Padilla  DOB: 02/22/1946 MRN: [id number]      Request for Surgical Clearance    Procedure:   Knee surgery  Date of Surgery:  Clearance TBD                                 Surgeon:   Dr. Lara Mulch Surgeon's Group or Practice Name:   Phone number:  [phone] Fax number:     Type of Clearance Requested:   - Medical    Type of Anesthesia:  Not Indicated   Additional requests/questions:   Patient stated she brought paperwork to the office to request clearance for this procedure two weeks ago.  Signed, Heloise Beecham   07/01/2022, 9:59 AM

## 2022-07-01 NOTE — Telephone Encounter (Addendum)
   Patient Name: April Padilla  DOB: 12-19-1945 MRN: [id number]  Primary Cardiologist: Dr. Lovena Le  Chart reviewed as part of pre-operative protocol coverage. Given past medical history and time since last visit, based on ACC/AHA guidelines, [name] would be at acceptable risk for the planned procedure without further cardiovascular testing.   I have called and verified with the patient, she has not had any recent chest pain or worsening dyspnea.  Heart rate seems to be fairly controlled.  Patient was seen by Dr. Lovena Le in the office on 05/10/2022, based on office note "Preop eval - she is pending right knee surgery. She is an acceptable surgical risk. Ok to hold the eliquis for 3 days before surgery and restart when surgical risk is acceptable."  I will route this recommendation to the requesting party via [place] fax function and remove from pre-op pool.  Please call with questions.  [place], [place] 07/01/2022, 1:10 PM

## 2022-07-01 NOTE — Telephone Encounter (Signed)
PHONE [phone] FAX [phone]

## 2022-07-01 NOTE — Telephone Encounter (Signed)
Please forward to the requesting provider's office.  No fax number was provided.

## 2022-07-16 ENCOUNTER — Other Ambulatory Visit: Payer: Self-pay | Admitting: Endocrinology

## 2022-07-16 ENCOUNTER — Other Ambulatory Visit: Payer: Self-pay | Admitting: Cardiology

## 2022-07-16 DIAGNOSIS — I482 Chronic atrial fibrillation, unspecified: Secondary | ICD-10-CM

## 2022-07-16 DIAGNOSIS — Z950 Presence of cardiac pacemaker: Secondary | ICD-10-CM

## 2022-08-11 ENCOUNTER — Ambulatory Visit (INDEPENDENT_AMBULATORY_CARE_PROVIDER_SITE_OTHER): Payer: Medicare Other

## 2022-08-11 DIAGNOSIS — I495 Sick sinus syndrome: Secondary | ICD-10-CM | POA: Diagnosis not present

## 2022-08-14 LAB — CUP PACEART REMOTE DEVICE CHECK
Battery Remaining Longevity: 96 mo
Battery Voltage: 3 V
Brady Statistic AS VP Percent: 0 %
Brady Statistic AS VS Percent: 0 %
Brady Statistic RV Percent Paced: 7.37 %
Date Time Interrogation Session: 20231013211413
Implantable Pulse Generator Implant Date: 20200210
Lead Channel Impedance Value: 590 Ohm
Lead Channel Pacing Threshold Amplitude: 0.625 V
Lead Channel Pacing Threshold Pulse Width: 0.24 ms
Lead Channel Sensing Intrinsic Amplitude: 21.825 mV
Lead Channel Setting Pacing Amplitude: 1.5 V
Lead Channel Setting Pacing Pulse Width: 0.24 ms
Lead Channel Setting Sensing Sensitivity: 2 mV

## 2022-08-22 NOTE — Progress Notes (Signed)
Remote pacemaker transmission.   

## 2022-08-29 ENCOUNTER — Other Ambulatory Visit: Payer: Self-pay | Admitting: Cardiology

## 2022-08-29 DIAGNOSIS — I482 Chronic atrial fibrillation, unspecified: Secondary | ICD-10-CM

## 2022-08-29 DIAGNOSIS — Z950 Presence of cardiac pacemaker: Secondary | ICD-10-CM

## 2022-09-29 ENCOUNTER — Ambulatory Visit (INDEPENDENT_AMBULATORY_CARE_PROVIDER_SITE_OTHER): Payer: Medicare Other | Admitting: Endocrinology

## 2022-09-29 ENCOUNTER — Encounter: Payer: Self-pay | Admitting: Endocrinology

## 2022-09-29 VITALS — BP 120/60 | HR 114 | Ht 66.0 in | Wt 152.0 lb

## 2022-09-29 DIAGNOSIS — E890 Postprocedural hypothyroidism: Secondary | ICD-10-CM

## 2022-09-29 LAB — TSH: TSH: 15.51 u[IU]/mL — ABNORMAL HIGH (ref 0.35–5.50)

## 2022-09-29 LAB — T4, FREE: Free T4: 1.2 ng/dL (ref 0.60–1.60)

## 2022-09-29 NOTE — Progress Notes (Signed)
Patient ID: April Padilla, female   DOB: [birth year], 76 y.o.   MRN: [id number]               Reason for Appointment: Endocrinology follow-up visit    History of Present Illness:   Problem 1:  Hypothyroidism was first diagnosed in the 1990s after treatment of hyperthyroidism with radioactive iodine Details are not available She thinks that for several years she had been stable on 88 g of levothyroxine  In 07/2017 she had a routine TSH test and this was found to be high at 7.9 Subsequently her levothyroxine doses have been very well between 112 and 125 mcg   She tends to have chronic symptoms of fatigue which she thinks is from her cardiac arrhythmia Also in the last few months she may have had more hair loss She has had some shakiness of her hands for quite some time  In 6/22 when she was on 100 mcg levothyroxine her TSH was high normal at 3.8  She was changed to EUTHYROX 100 mcg Subsequently her dose has varied between 7 tablets a week and 7-1/2 tablets a week She is currently taking 7-1/2 tablets a week  However she thinks that because of her having her knee surgery earlier this month she has missed at least a couple of doses of her levothyroxine She is still getting the [facility] from [facility]  She has not been feeling well with complications of her knee surgery and is losing weight with decreased appetite  Usually would take levothyroxine in the morning an hour before breakfast with water only About a week ago her TSH was 15 done by PCP, dose has not been changed  Labs be checked today         Patient's weight history is as follows:  Wt Readings from Last 3 Encounters:  09/29/22 152 lb (68.9 kg)  05/10/22 158 lb (71.7 kg)  03/29/22 162 lb 9.6 oz (73.8 kg)    Thyroid function results have been as follows:  Lab Results  Component Value Date   TSH 1.13 03/29/2022   TSH 5.54 (H) 09/29/2021   TSH 0.43 06/28/2021   TSH 3.81 04/27/2021   FREET4 1.56  03/29/2022   FREET4 1.30 09/29/2021   FREET4 1.43 06/28/2021   FREET4 1.04 04/27/2021   T3FREE 2.5 09/16/2020    HYPERCALCEMIA: See review of systems    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   Chest discomfort 12/23/2018   Chronic atrial fibrillation ([place]) 01/26/2016   Last Assessment & Plan:  Has had ablation through [facility] , her BP here was stable , discussed her current meds and the S/E , she is off of the lasix now and will see her CMP for her as well as CK today for her muscle cramps she has had long before her ablation with possible pain from the statin   Chronic fatigue 07/25/2016   Chronic idiopathic constipation 01/26/2016   Dysrhythmia    on toprol for fast heart beat   Encounter for care of pacemaker 12/19/2018   Essential (primary) hypertension 01/26/2016   Family history of colon cancer in mother 07/17/2017   GERD (gastroesophageal reflux disease) 07/17/2017   H/O hiatal hernia    High risk medication use 01/26/2016   History of adenomatous polyp of colon 07/17/2017   Hypercalcemia 01/26/2016   Hypothyroidism    pt. states she took radioactive pill and has no thyroid  Irregular heart rate 07/06/2018   Left shoulder pain 07/06/2018   Long term current use of anticoagulant 07/06/2018   Mixed hyperlipidemia 01/26/2016   Myalgia 05/18/2016   Last Assessment & Plan:  Formatting of this note might be different from the original. 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   Presence of left artificial knee joint 01/22/2015   Primary generalized (osteo)arthritis 01/26/2016   Primary hyperparathyroidism ([place]) 11/26/2017   Primary osteoarthritis  involving multiple joints 01/26/2016   PSVT (paroxysmal supraventricular tachycardia) 01/26/2016   Recurrent urticaria 01/26/2016   S/P TKR (total knee replacement) 12/27/2012   Shortness of breath    with exertion   Sinobronchitis 09/07/2018   Situational anxiety 09/07/2018   Thyroid function test abnormal 08/19/2016   Vitamin D deficiency 11/26/2017    Past Surgical History:  Procedure Laterality Date   BUNIONECTOMY  yrs. ago   left foot   FOOT SURGERY  2009   took knot out  and put in screws   KNEE ARTHROSCOPY W/ MENISCAL REPAIR  2009   left   TOTAL KNEE ARTHROPLASTY  01/16/2012   Procedure: TOTAL KNEE ARTHROPLASTY;  Surgeon: Rudean Haskell, MD;  Location: [place];  Service: Orthopedics;  Laterality: Left;    Family History  Problem Relation Age of Onset   Cancer Mother    Thyroid disease Mother    Stroke Father    Anesthesia problems Neg Hx    Hypotension Neg Hx    Malignant hyperthermia Neg Hx    Pseudochol deficiency Neg Hx    Sleep apnea Neg Hx     Social History:  reports that she has never smoked. She has never used smokeless tobacco. She reports that she does not drink alcohol and does not use drugs.  Allergies:  Allergies  Allergen Reactions   Clindamycin/Lincomycin Rash   Nitrofurantoin Monohyd Macro Rash   Silver Sulfadiazine Rash    Allergies as of 09/29/2022       Reactions   Clindamycin/lincomycin Rash   Nitrofurantoin Monohyd Macro Rash   Silver Sulfadiazine Rash        Medication List        Accurate as of September 29, 2022 10:31 AM. If you have any questions, ask your nurse or doctor.          ALPRAZolam 0.5 MG tablet Commonly known as: XANAX Take 0.5 mg by mouth daily as needed for anxiety or sleep.   apixaban 5 MG Tabs tablet Commonly known as: ELIQUIS Take 5 mg by mouth 2 (two) times daily.   cholecalciferol 25 MCG (1000 UNIT) tablet Commonly known as: VITAMIN D3 Take 2,000 Units by mouth daily.   Euthyrox 100 MCG  tablet Generic drug: levothyroxine TAKE 1 TABLET BY MOUTH DAILY WITH EXTRA HALF TABLET ON SUNDAYS   famotidine 20 MG tablet Commonly known as: PEPCID Take 1 tablet by mouth in the morning and at bedtime.   furosemide 20 MG tablet Commonly known as: LASIX Take 20 mg by mouth.   magnesium oxide 400 MG tablet Commonly known as: MAG-OX Take 400 mg by mouth daily.   metoprolol succinate 25 MG 24 hr tablet Commonly known as: TOPROL-XL Take 1 tablet (25 mg total) by mouth at  bedtime.   mupirocin ointment 2 % Commonly known as: BACTROBAN Apply 1 application topically as needed (wound care).   rosuvastatin 5 MG tablet Commonly known as: CRESTOR Take 5 mg by mouth daily.   verapamil 120 MG tablet Commonly known as: Calan Take 1 tablet (120 mg total) by mouth 2 (two) times daily.           Review of Systems  HYPERCALCEMIA: She has mild hyperparathyroidism with variable increase in calcium levels Previously had increased PTH level  also  Highest calcium has been 10.8 previously Recently normal at 10.1 with labs done by PCP   Lab Results  Component Value Date   PTH 76 (H) 11/24/2017   CALCIUM 10.3 12/28/2021    OSTEOPENIA: In 2019 she was found to have osteopenia as follows, this was her first bone density  Bone density in 08/2020 is stable to improved, has not been on any medications for this    Lumbar spine L1-L4 Femoral neck (FN) 33% distal radius  T-score   -1.3 RFN: -1.6 LFN: -1.8 -2.5  Change in BMD from previous DXA test (%) Up 3.5%* Up 2.0% Down 0.5%  (*) statistically significant  In 05/2018:    Lumbar spine L1-L4 Femoral neck (FN) 33% distal radius  T-score -1.6 RFN: -1.5 LFN: -2.1 -2.5   VITAMIN D deficiency:  She was found to have a low vitamin D level of 15 in 10/2017  She is taking OTC vitamin D3, 2000 units daily    Lab Results  Component Value Date   VD25OH 31.60 07/14/2020   VD25OH 37.79 06/17/2019   VD25OH 33.12 05/31/2018                Examination:    BP 120/60   Pulse (!) 114   Ht '5\' 6"'$  (1.676 m)   Wt 152 lb (68.9 kg)   SpO2 96%   BMI 24.53 kg/m   Biceps reflexes show normal relaxation  Assessment:  HYPOTHYROIDISM, secondary to I-131 ablation of thyroid for hyperthyroidism  She has had somewhat variable thyroid levels for some time  She is having difficulty with taking her levothyroxine regularly this month but only thinks she has missed a couple of doses  She is taking Euthyrox brand, 100 mcg She has nonspecific fatigue and weight loss and not clear if her TSH is still high since this was checked a few days after her missed doses   HYPERPARATHYROIDISM: Has had mild hypercalcemia with last calcium normal done by PCP Also has osteopenia which is relatively mild    PLAN:   TSH to be checked today Her dose will be adjusted accordingly  Bone density will be ordered on the next visit in follow-up   There are no Patient Instructions on file for this visit.    Elayne Snare 09/29/2022, 10:31 AM     Note: This office note was prepared with Dragon voice recognition system technology. Any transcriptional errors that result from this process are unintentional.  Addendum: TSH 15, she will now go to 125 mcg levothyroxine

## 2022-10-01 ENCOUNTER — Other Ambulatory Visit: Payer: Self-pay | Admitting: Endocrinology

## 2022-10-01 MED ORDER — EUTHYROX 125 MCG PO TABS: 125.0000 ug | ORAL_TABLET | Freq: Every day | ORAL | 1 refills | Status: DC

## 2022-10-04 ENCOUNTER — Telehealth: Payer: Self-pay

## 2022-10-04 ENCOUNTER — Telehealth: Payer: Self-pay | Admitting: Internal Medicine

## 2022-10-04 NOTE — Telephone Encounter (Signed)
Pt is calling to let Dr. Lovena Le know she was admitted into [facility] 12/02 and was discharged 12/04 for covid. She states she would just like to make Dr. Lovena Le aware and she is also requesting call back to discuss med change.

## 2022-10-04 NOTE — Telephone Encounter (Signed)
error 

## 2022-10-04 NOTE — Telephone Encounter (Signed)
Pt called back regarding medication question for Dr. Lovena Le.   Pt asked if she should take Toprol XL 25 Mg dose in the evening and in the morning?   Pt stated she was recently seen in the ER, but was concerned about her heart rate.     Pt told per Dr. Lovena Le, to keep taking the Toprol XL 25 Mg dose in the evening only.  If her Heart Rate increases, she was instructed to call [facility] on [address] to make [place] aware of any Tachycardia.   Pt understood Dr. Tanna Furry recommendations.

## 2022-10-04 NOTE — Telephone Encounter (Signed)
Patient called in for lab results. I gave lab results to patient. She understood and will start the Euthyrox 125 MCG.

## 2022-10-17 ENCOUNTER — Telehealth: Payer: Self-pay | Admitting: Internal Medicine

## 2022-10-17 NOTE — Telephone Encounter (Signed)
Called Pt back regarding message received in Triage.    Pt stated this morning her BP was 82/46, and HR was 114, and 119.  Pt is asymptomatic.   Pt has an MDT PPM, and takes Verapamil 120 mg BID, and Toprol  XL 25 mg.    I had Pt recheck her BP and HR with me on telephone, and BP was 93/56, and HR 89.   I encouraged PT to increase fluids, and eat a salty snack to increase BP.  Pt understood.  Pt thinks AFIB may be present,  as Pt has a history.   Pt will send a transmission to the device team for assessment, and POC will be addressed then.

## 2022-10-17 NOTE — Telephone Encounter (Signed)
Remote transmission received.   Presenting suggest AF possible due to irregular V-V intervals (micra is single chamber). Histograms appear controlled rates and presenting is controlled rate as well. Routing back to [place] for follow up.   If patient is still symptomatic suggest AF clinic. If not, let Dr. Lovena Le weigh in on his thoughts.

## 2022-10-17 NOTE — Telephone Encounter (Signed)
Pt states she is having problems sending transmission and would like a call back to discuss this. Please advise.

## 2022-10-17 NOTE — Telephone Encounter (Signed)
Transmission received 10/17/2022.

## 2022-10-17 NOTE — Telephone Encounter (Signed)
Patient c/o Palpitations:  High priority if patient c/o lightheadedness, shortness of breath, or chest pain  How long have you had palpitations/irregular HR/ Afib? Are you having the symptoms now? yes  Are you currently experiencing lightheadedness, SOB or CP?  no  Do you have a history of afib (atrial fibrillation) or irregular heart rhythm? yes  Have you checked your BP or HR? (document readings if available): 82/46 HR:114;119;  Are you experiencing any other symptoms? Prev test covid

## 2022-10-18 NOTE — Telephone Encounter (Signed)
Order sent to scheduling per Dr. Lovena Le for patient to come in and see EP APP, as soon as an appointment is available.  Pt needs to be seen for hypotension concerns vs medication management.

## 2022-10-26 ENCOUNTER — Other Ambulatory Visit: Payer: Self-pay | Admitting: Cardiology

## 2022-10-26 DIAGNOSIS — I482 Chronic atrial fibrillation, unspecified: Secondary | ICD-10-CM

## 2022-10-26 DIAGNOSIS — Z950 Presence of cardiac pacemaker: Secondary | ICD-10-CM

## 2022-10-30 ENCOUNTER — Other Ambulatory Visit: Payer: Self-pay | Admitting: Internal Medicine

## 2022-11-01 NOTE — Progress Notes (Addendum)
Cardiology Office Note Date:  11/02/2022  Patient ID:  April Padilla, April Padilla 10-Jan-1946, MRN [id number] PCP:  [name], MD  Cardiologist:  Shirlee More, MD  Electrophysiologist: Dr. Curt Bears > Cristopher Peru, MD and previously Dr. [facility])  Chief Complaint: palpitation, hypotension  History of Present Illness: April Padilla is a 77 y.o. female with PMH notable for Afib (s/p 2 catheter ablation); seen today for Cristopher Peru, MD for acute visit due to palpitation and hypotension, concern for AFib.    Last saw Dr. Lovena Le 04/2022, minimal palpitations but tires quickly.  Admitted to [facility] 12/2-01/2022 for covid and Afib w RVR. Was started on cardizem gtt, weaned to home dose of lopressor and verapamil prior to discharge.   Afterward called office c/o palpitation, hypotension. Also rec'd transmission concerning for AFib  Today, she still thinks she is in Afib. Has very little energy, some palpitations. Thinks her AF was better controlled prior to getting covid in the fall 2023. She has some RLE edema, thinks it might be because of her recent knee surgery. Her thyroid levels are also off, being managed by Endocrinology  she denies chest pain, dyspnea, PND, orthopnea, nausea, vomiting, dizziness, syncope, weight gain, or early satiety.    She is diligent about taking eliquis, uses alarms to remember meds, denies recent missing doses.   She would like to continue all EP care with [facility], not agreeable to the commute to [facility].    Device Information: MicraAV; bradycardia, implanted 12/2018 @ [facility]  AAD History: Cryoablation in 2016, and then was treated with flecainide, propafenone, Multaq and propafenone. She had repeat PVI at [facility] in 2017 > returned to AF DCCV + tikosyn load 07/2016 > returned to AF, so stopped - offered amio, but refused. Currently rate control only.  Past Medical History:  Diagnosis Date   Cervical intraepithelial neoplasia grade III with severe  dysplasia 10/31/2000   Chest discomfort 12/23/2018   Chronic atrial fibrillation ([place]) 01/26/2016   Last Assessment & Plan:  Has had ablation through [facility] , her BP here was stable , discussed her current meds and the S/E , she is off of the lasix now and will see her CMP for her as well as CK today for her muscle cramps she has had long before her ablation with possible pain from the statin   Chronic fatigue 07/25/2016   Chronic idiopathic constipation 01/26/2016   Dysrhythmia    on toprol for fast heart beat   Encounter for care of pacemaker 12/19/2018   Essential (primary) hypertension 01/26/2016   Family history of colon cancer in mother 07/17/2017   GERD (gastroesophageal reflux disease) 07/17/2017   H/O hiatal hernia    High risk medication use 01/26/2016   History of adenomatous polyp of colon 07/17/2017   Hypercalcemia 01/26/2016   Hypothyroidism    pt. states she took radioactive pill and has no thyroid   Irregular heart rate 07/06/2018   Left shoulder pain 07/06/2018   Long term current use of anticoagulant 07/06/2018   Mixed hyperlipidemia 01/26/2016   Myalgia 05/18/2016   Last Assessment & Plan:  Formatting of this note might be different from the original. Will update her CMP for her and ensure that her potassium and her magnesium are stable for her with the recent myalgias and fatigue post procedure for her   On continuous oral anticoagulation 08/09/2016   On dofetilide therapy 01/29/2018   Pain of left hip joint 11/22/2018   Postablative hypothyroidism  11/24/2017   Presence of left artificial knee joint 01/22/2015   Primary generalized (osteo)arthritis 01/26/2016   Primary hyperparathyroidism ([place]) 11/26/2017   Primary osteoarthritis involving multiple joints 01/26/2016   PSVT (paroxysmal supraventricular tachycardia) 01/26/2016   Recurrent urticaria 01/26/2016   S/P TKR (total knee replacement) 12/27/2012   Shortness of breath    with exertion   Sinobronchitis 09/07/2018   Situational anxiety  09/07/2018   Thyroid function test abnormal 08/19/2016   Vitamin D deficiency 11/26/2017    Past Surgical History:  Procedure Laterality Date   BUNIONECTOMY  yrs. ago   left foot   FOOT SURGERY  2009   took knot out  and put in screws   KNEE ARTHROSCOPY W/ MENISCAL REPAIR  2009   left   TOTAL KNEE ARTHROPLASTY  01/16/2012   Procedure: TOTAL KNEE ARTHROPLASTY;  Surgeon: Rudean Haskell, MD;  Location: [facility];  Service: Orthopedics;  Laterality: Left;    Current Outpatient Medications  Medication Sig Dispense Refill   ALPRAZolam (XANAX) 0.5 MG tablet Take 0.5 mg by mouth daily as needed for anxiety or sleep.     apixaban (ELIQUIS) 5 MG TABS tablet Take 5 mg by mouth 2 (two) times daily.      cholecalciferol (VITAMIN D3) 25 MCG (1000 UNIT) tablet Take 2,000 Units by mouth daily.     EUTHYROX 125 MCG tablet Take 1 tablet (125 mcg total) by mouth daily before breakfast. 90 tablet 1   famotidine (PEPCID) 20 MG tablet Take 1 tablet by mouth in the morning and at bedtime.     magnesium oxide (MAG-OX) 400 MG tablet Take 400 mg by mouth daily.     rosuvastatin (CRESTOR) 5 MG tablet Take 5 mg by mouth daily.     furosemide (LASIX) 20 MG tablet Take 1 tablet (20 mg total) by mouth as needed for fluid or edema. 90 tablet 3   metoprolol succinate (TOPROL-XL) 50 MG 24 hr tablet Take 1 tablet (50 mg total) by mouth at bedtime. 90 tablet 3   verapamil (CALAN) 120 MG tablet Take 1 tablet (120 mg total) by mouth 2 (two) times daily. 180 tablet 3   No current facility-administered medications for this visit.    Allergies:   Clindamycin/lincomycin, Nitrofurantoin monohyd macro, and Silver sulfadiazine   Social History:  The patient  reports that she has never smoked. She has never used smokeless tobacco. She reports that she does not drink alcohol and does not use drugs.   Family History:  The patient's family history includes Cancer in her mother; Stroke in her father; Thyroid disease in her  mother.  ROS:  Please see the history of present illness. All other systems are reviewed and otherwise negative.   PHYSICAL EXAM:  VS:  BP 128/68   Pulse 91   Ht '5\' 6"'$  (1.676 m)   Wt 148 lb (67.1 kg)   SpO2 98%   BMI 23.89 kg/m  BMI: Body mass index is 23.89 kg/m.  GEN- The patient is well appearing, alert and oriented x 3 today.   HEENT: normocephalic, atraumatic; sclera clear, conjunctiva pink; hearing intact; oropharynx clear; neck supple, no JVP Lungs- Clear to ausculation bilaterally, normal work of breathing.  No wheezes, rales, rhonchi Heart- Irregularly irregular rate and rhythm, no murmurs, rubs or gallops, PMI not laterally displaced GI- soft, non-tender, non-distended, bowel sounds present, no hepatosplenomegaly Extremities- 1+ peripheral edema R>L. no clubbing or cyanosis; DP/PT/radial pulses 2+ bilaterally MS- no significant deformity or atrophy Skin- warm  and dry, no rash or lesion Psych- euthymic mood, full affect Neuro- strength and sensation are intact   Device interrogation done today and reviewed by myself:  Battery good V-Lead thresholds, impedence, sensing stable  No episodes Histograms appear to have shifted to R No changes made today  EKG is ordered. Personal review of EKG from today shows:  Afib, rate 91bpm  Recent Labs: 12/28/2021: ALT 18; BUN 13; Creatinine, Ser 1.00; Potassium 4.2; Sodium 141 09/29/2022: TSH 15.51  12/28/2021: Chol/HDL Ratio 2.1; Cholesterol, Total 179; HDL 87; LDL Chol Calc (NIH) 68; Triglycerides 143   CrCl cannot be calculated (Patient's most recent lab result is older than the maximum 21 days allowed.).   Wt Readings from Last 3 Encounters:  11/02/22 148 lb (67.1 kg)  09/29/22 152 lb (68.9 kg)  05/10/22 158 lb (71.7 kg)     Additional studies reviewed include: Previous EP notes, cardiology notes, [facility] EP notes   Echo 11/11/2020 IMPRESSIONS   1. Left ventricular ejection fraction, by estimation, is 65 to 70%. The left  ventricle has normal function. The left ventricle has no regional wall motion abnormalities. There is mild concentric left ventricular hypertrophy. Diastolic function indeterminant due to atrial fibrillation.   2. Right ventricular systolic function is normal. The right ventricular size is moderately enlarged. There is mildly elevated pulmonary artery systolic pressure. The estimated right ventricular systolic pressure is 64.3 mmHg.   3. Left atrial size was moderately dilated.   4. Right atrial size was severely dilated.   5. The mitral valve is normal in structure. Mild mitral valve regurgitation.   6. The aortic valve is tricuspid. There is mild thickening of the aortic valve. Aortic valve regurgitation is not visualized. No aortic stenosis is present.   7. The inferior vena cava is normal in size with <50% respiratory variability, suggesting right atrial pressure of 8 mmHg.    ASSESSMENT AND PLAN:  #) AFib Upon further chart review of previous EKGs dating back to 2021 are AF although patient sympatomic felt that AF is new since covid diagnosis. Pacer with no diagnostics available to establish Patient has a list of VS from home started in Dec 2023, most HR are 90-110s Continue Verapamil '120mg'$  BID Increase Toprol XL '50mg'$  daily - keeping a monitor on dizziness Last DCCV was many years ago, potentially prior to most recent ablation. Discussed limitation of DCCV alone without AAD in ability to restore and maintain SR is low; patient would like to try. She remains adamant about not taking amiodarone - fearful of side effects - DCCV in near future - updated pre-procedure labs   #) Hypercoag d/t AFib CHA2DS2-VASc Score =at least 5 [CHF History: 1, HTN History: 1, Diabetes History: 0, Stroke History: 0, Vascular Disease History: 0, Age Score: 2, Gender Score: 1].  Therefore, the patient's annual risk of stroke is 7.2 %.    AC - Eliquis '5mg'$  BID, dosed appropriately   #) Bradycardia s/p MDT  Micra Device functioning appropriately Ventricular histograms appear to have shift to the R  - increase metop as above   #) HTN At goal today.   Her home records show systolics usually in 329-518A, rarely in 90s Recommend checking blood pressures 1-2 times per week at home and recording the values.  Recommend bringing these recordings to the primary care physician.   Current medicines are reviewed at length with the patient today.   The patient does not have concerns regarding her medicines.  The following changes were made  today:   INCREASE metop to 50 mg nightly  Labs/ tests ordered today include:  Orders Placed This Encounter  Procedures   Basic Metabolic Panel (BMET)   CBC   CUP [place]   EKG 12-Lead     Disposition: Follow up with EP APP in as usual post procedure   Signed, Mamie Levers, NP  11/02/22 4:49 PM   [facility] [place] [address] [phone] (office)  [phone] (fax)

## 2022-11-02 ENCOUNTER — Ambulatory Visit: Payer: Medicare Other | Attending: Physician Assistant | Admitting: Cardiology

## 2022-11-02 ENCOUNTER — Encounter: Payer: Self-pay | Admitting: *Deleted

## 2022-11-02 ENCOUNTER — Encounter: Payer: Self-pay | Admitting: Cardiology

## 2022-11-02 VITALS — BP 128/68 | HR 91 | Ht 66.0 in | Wt 148.0 lb

## 2022-11-02 DIAGNOSIS — I5032 Chronic diastolic (congestive) heart failure: Secondary | ICD-10-CM | POA: Diagnosis present

## 2022-11-02 DIAGNOSIS — I10 Essential (primary) hypertension: Secondary | ICD-10-CM | POA: Diagnosis not present

## 2022-11-02 DIAGNOSIS — Z950 Presence of cardiac pacemaker: Secondary | ICD-10-CM | POA: Insufficient documentation

## 2022-11-02 DIAGNOSIS — I4821 Permanent atrial fibrillation: Secondary | ICD-10-CM | POA: Diagnosis present

## 2022-11-02 DIAGNOSIS — D6869 Other thrombophilia: Secondary | ICD-10-CM | POA: Diagnosis not present

## 2022-11-02 DIAGNOSIS — I110 Hypertensive heart disease with heart failure: Secondary | ICD-10-CM | POA: Diagnosis not present

## 2022-11-02 DIAGNOSIS — I4891 Unspecified atrial fibrillation: Secondary | ICD-10-CM | POA: Insufficient documentation

## 2022-11-02 LAB — CUP PACEART INCLINIC DEVICE CHECK
Date Time Interrogation Session: 20240103162345
Implantable Pulse Generator Implant Date: 20200210
Lead Channel Pacing Threshold Amplitude: 0.6 V
Lead Channel Pacing Threshold Pulse Width: 0.24 ms
Lead Channel Sensing Intrinsic Amplitude: 20 mV

## 2022-11-02 MED ORDER — VERAPAMIL HCL 120 MG PO TABS: 120.0000 mg | ORAL_TABLET | Freq: Two times a day (BID) | ORAL | 3 refills | Status: DC

## 2022-11-02 MED ORDER — METOPROLOL SUCCINATE ER 50 MG PO TB24: 50.0000 mg | ORAL_TABLET | Freq: Every day | ORAL | 3 refills | Status: DC

## 2022-11-02 MED ORDER — FUROSEMIDE 20 MG PO TABS: 20.0000 mg | ORAL_TABLET | ORAL | 3 refills | Status: DC | PRN

## 2022-11-02 NOTE — Patient Instructions (Addendum)
Medication Instructions:    START TAKING: TOPROL XL   50 MG ONCE A DAY    *If you need a refill on your cardiac medications before your next appointment, please call your pharmacy*   Lab Work:  BMET AND CBC TODAY       If you have labs (blood work) drawn today and your tests are completely normal, you will receive your results only by: [place] (if you have MyChart) OR A paper copy in the mail If you have any lab test that is abnormal or we need to change your treatment, we will call you to review the results.   Testing/Procedures: Your physician has recommended that you have a Cardioversion (DCCV). Electrical Cardioversion uses a jolt of electricity to your heart either through paddles or wired patches attached to your chest. This is a controlled, usually prescheduled, procedure. Defibrillation is done under light anesthesia in the hospital, and you usually go home the day of the procedure. This is done to get your heart back into a normal rhythm. You are not awake for the procedure. Please see the instruction sheet given to you today.     Follow-Up: At [facility], you and your health needs are our priority.  As part of our continuing mission to provide you with exceptional heart care, we have created designated Provider Care Teams.  These Care Teams include your primary Cardiologist (physician) and Advanced Practice Providers (APPs -  Physician Assistants and Nurse Practitioners) who all work together to provide you with the care you need, when you need it.  We recommend signing up for the patient portal called "MyChart".  Sign up information is provided on this After Visit Summary.  MyChart is used to connect with patients for Virtual Visits (Telemedicine).  Patients are able to view lab/test results, encounter notes, upcoming appointments, etc.  Non-urgent messages can be sent to your provider as well.   To learn more about what you can do with MyChart, go to  [email or web address].    Your next appointment:  AFTER  POST CARDIOVERSION    2-4 week(s)  The format for your next appointment:   In Person  Provider:   Beryle Beams" Tillery, PA-C or Tommye Standard, PA-C   Other Instructions  TAKE BLOOD PRESSURE FOR 1 TO 2 WEEKS  AND SEND IN READINGS TO MYCHART OR CALL  IF YOU GAIN WEIGHT IN  3 LBS IN 24 HOURS OR 5 LBS IN A WEEK  TAKE AN 20 MG TABLET OF FUROSEMIDE.   Important Information About Sugar

## 2022-11-03 LAB — CBC
Hematocrit: 38.3 % (ref 34.0–46.6)
Hemoglobin: 12.2 g/dL (ref 11.1–15.9)
MCH: 31 pg (ref 26.6–33.0)
MCHC: 31.9 g/dL (ref 31.5–35.7)
MCV: 97 fL (ref 79–97)
Platelets: 375 10*3/uL (ref 150–450)
RBC: 3.94 x10E6/uL (ref 3.77–5.28)
RDW: 12.4 % (ref 11.7–15.4)
WBC: 7.1 10*3/uL (ref 3.4–10.8)

## 2022-11-03 LAB — BASIC METABOLIC PANEL
BUN/Creatinine Ratio: 10 — ABNORMAL LOW (ref 12–28)
BUN: 9 mg/dL (ref 8–27)
CO2: 24 mmol/L (ref 20–29)
Calcium: 10.3 mg/dL (ref 8.7–10.3)
Chloride: 100 mmol/L (ref 96–106)
Creatinine, Ser: 0.88 mg/dL (ref 0.57–1.00)
Glucose: 92 mg/dL (ref 70–99)
Potassium: 4.1 mmol/L (ref 3.5–5.2)
Sodium: 139 mmol/L (ref 134–144)
eGFR: 67 mL/min/{1.73_m2} (ref >59)

## 2022-11-09 NOTE — Progress Notes (Unsigned)
Cardiology Office Note:    Date:  11/10/2022   ID:  April Padilla, DOB June 11, 1946, MRN [id number]  PCP:  [name], MD  Cardiologist:  Shirlee More, MD    Referring MD: [name], MD    ASSESSMENT:    1. Permanent atrial fibrillation ([place])   2. Hypertensive heart disease with chronic diastolic congestive heart failure (HCC)   3. Cardiac pacemaker   4. Chronic anticoagulation    PLAN:    In order of problems listed above:  She has longstanding atrial fibrillation failed antiarrhythmic drugs failed EP catheter ablations and she is already made up her mind she has not had another cardioversion I think she do best with rate control tolerates verapamil better than other calcium channel blockers and beta-blockers she is having significant CNS side effects we will reduce her beta-blocker increase the dose of verapamil and trend her heart rate at home with a pulse meter. Stable continue current treatment including as needed diuretic Stable pacemaker function followed in our device clinic Continue anticoagulant Continue her statin   Next appointment: 6 months   Medication Adjustments/Labs and Tests Ordered: Current medicines are reviewed at length with the patient today.  Concerns regarding medicines are outlined above.  No orders of the defined types were placed in this encounter.  No orders of the defined types were placed in this encounter.   Chief complaint follow-up atrial fibrillation, in the interim had COVID-19 pneumonia   History of Present Illness:    April Padilla is a 77 y.o. 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 Compliance with diet, lifestyle and medications: Yes  She is struggling  she had knee surgery and has trouble advancing back and had COVID infection requiring hospitalization and Paxlovid therapy Rate was rapid beta-blocker dose was increased and again she is having CNS side effects She was advised to eversion I told her I doubt it would be effective but I do not think we can keep her in atrial fibrillation long-term and she is already made up her mind that she is not can do it To try to control her rate and then increase the dose of verapamil which has been very well-tolerated reduce her dose of beta-blocker I asked her if she is getting heart rates less than 80 at home to let me know we could back off of her calcium channel blocker She is not having shortness of breath edema or chest pain She tolerates her anticoagulant without bleeding 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   Chest discomfort 12/23/2018   Chronic atrial fibrillation ([place]) 01/26/2016   Last Assessment & Plan:  Has had ablation through [facility] , her BP here was stable , discussed her current meds and the S/E , she is off of the lasix now and will see her CMP for her as well as CK today for her muscle cramps she has had long before her ablation with possible pain from the statin   Chronic fatigue 07/25/2016   Chronic idiopathic constipation 01/26/2016   Dysrhythmia    on toprol for fast heart beat   Encounter for care of pacemaker 12/19/2018   Essential (primary) hypertension 01/26/2016   Family history of colon cancer in  mother 07/17/2017   GERD (gastroesophageal reflux disease) 07/17/2017   H/O hiatal hernia    High risk medication use 01/26/2016   History of adenomatous polyp of colon 07/17/2017   Hypercalcemia 01/26/2016   Hypothyroidism    pt. states she took radioactive pill and has no thyroid   Irregular heart rate 07/06/2018   Left shoulder pain 07/06/2018   Long term current use of anticoagulant 07/06/2018   Mixed  hyperlipidemia 01/26/2016   Myalgia 05/18/2016   Last Assessment & Plan:  Formatting of this note might be different from the original. 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   Presence of left artificial knee joint 01/22/2015   Primary generalized (osteo)arthritis 01/26/2016   Primary hyperparathyroidism ([place]) 11/26/2017   Primary osteoarthritis involving multiple joints 01/26/2016   PSVT (paroxysmal supraventricular tachycardia) 01/26/2016   Recurrent urticaria 01/26/2016   S/P TKR (total knee replacement) 12/27/2012   Shortness of breath    with exertion   Sinobronchitis 09/07/2018   Situational anxiety 09/07/2018   Thyroid function test abnormal 08/19/2016   Vitamin D deficiency 11/26/2017    Past Surgical History:  Procedure Laterality Date   BUNIONECTOMY  yrs. ago   left foot   FOOT SURGERY  2009   took knot out  and put in screws   KNEE ARTHROSCOPY W/ MENISCAL REPAIR  2009   left   TOTAL KNEE ARTHROPLASTY  01/16/2012   Procedure: TOTAL KNEE ARTHROPLASTY;  Surgeon: Rudean Haskell, MD;  Location: [facility];  Service: Orthopedics;  Laterality: Left;    Current Medications: Current Meds  Medication Sig   ALPRAZolam (XANAX) 0.5 MG tablet Take 0.5 mg by mouth daily as needed for anxiety or sleep.   apixaban (ELIQUIS) 5 MG TABS tablet Take 5 mg by mouth 2 (two) times daily.    cholecalciferol (VITAMIN D3) 25 MCG (1000 UNIT) tablet Take 2,000 Units by mouth daily.   EUTHYROX 125 MCG tablet Take 1 tablet (125 mcg total) by mouth daily before breakfast.   famotidine (PEPCID) 20 MG tablet Take 1 tablet by mouth in the morning and at bedtime.   furosemide (LASIX) 20 MG tablet Take 1 tablet (20 mg total) by mouth as needed for  fluid or edema.   magnesium oxide (MAG-OX) 400 MG tablet Take 400 mg by mouth daily.   metoprolol succinate (TOPROL-XL) 50 MG 24 hr tablet Take 1 tablet (50 mg total) by mouth at bedtime.   rosuvastatin (CRESTOR) 5 MG tablet Take 5 mg by mouth daily.   verapamil (CALAN) 120 MG tablet Take 1 tablet (120 mg total) by mouth 2 (two) times daily.     Allergies:   Clindamycin/lincomycin, Nitrofurantoin monohyd macro, and Silver sulfadiazine   Social History   Socioeconomic History   Marital status: Married    Spouse name: Not on file   Number of children: Not on file   Years of education: Not on file   Highest education level: Not on file  Occupational History   Not on file  Tobacco Use   Smoking status: Never   Smokeless tobacco: Never  Substance and Sexual Activity   Alcohol use: No    Comment: occasional   Drug use: No   Sexual  activity: Yes  Other Topics Concern   Not on file  Social History Narrative   Not on file   Social Determinants of Health   Financial Resource Strain: Not on file  Food Insecurity: Not on file  Transportation Needs: Not on file  Physical Activity: Insufficiently Active (11/24/2017)   Exercise Vital Sign    Days of Exercise per Week: 2 days    Minutes of Exercise per Session: 20 min  Stress: Not on file  Social Connections: Not on file     Family History: The patient's family history includes Cancer in her mother; Stroke in her father; Thyroid disease in her mother. There is no history of Anesthesia problems, Hypotension, Malignant hyperthermia, Pseudochol deficiency, or Sleep apnea. ROS:   Please see the history of present illness.    All other systems reviewed and are negative.  EKGs/Labs/Other Studies Reviewed:    The following studies were reviewed today  [facility] 2020 showed normal right and left ventricular function mild mitral regurgitation mild tricuspid regurgitation.echocardiogram in my office October 2022  showed mild concentric LVH normal systolic function EF 65 to 70% right ventricle is moderately enlarged mildly elevated pulmonary artery systolic pressure 41 mmHg normal right ventricular systolic function.  Right atrium is severely dilated left atrium is moderately dilated.  Last pacemaker check 11/02/2020 showed projected battery 8 years is ventricular paced 5.3% of the time.  Recent labs performed at [facility] PCP 10/28/2022: TSH normal 5.9 creatinine 0.97 sodium 133 potassium 4.3 GFR 60 cc/min cholesterol 159 LDL 63 triglycerides 92 HDL 82  Recent Labs: 12/28/2021: ALT 18 09/29/2022: TSH 15.51 11/02/2022: BUN 9; Creatinine, Ser 0.88; Hemoglobin 12.2; Platelets 375; Potassium 4.1; Sodium 139  Recent Lipid Panel    Component Value Date/Time   CHOL 179 12/28/2021 1120   TRIG 143 12/28/2021 1120   HDL 87 12/28/2021 1120   CHOLHDL 2.1 12/28/2021 1120   LDLCALC 68 12/28/2021 1120    Physical Exam:    VS:  BP 130/80 (BP Location: Right Arm, Patient Position: Sitting)   Pulse 92   Ht '5\' 6"'$  (1.676 m)   Wt 149 lb (67.6 kg)   SpO2 97%   BMI 24.05 kg/m     Wt Readings from Last 3 Encounters:  11/10/22 149 lb (67.6 kg)  11/02/22 148 lb (67.1 kg)  09/29/22 152 lb (68.9 kg)     GEN:  Well nourished, well developed in no acute distress HEENT: Normal NECK: No JVD; No carotid bruits LYMPHATICS: No lymphadenopathy CARDIAC: Irregular rate and rhythm with variable response  RESPIRATORY:  Clear to auscultation without rales, wheezing or rhonchi  ABDOMEN: Soft, non-tender, non-distended MUSCULOSKELETAL:  No edema; No deformity  SKIN: Warm and dry NEUROLOGIC:  Alert and oriented x 3 PSYCHIATRIC:  Normal affect    Signed, Shirlee More, MD  11/10/2022 3:10 PM    [facility]

## 2022-11-10 ENCOUNTER — Ambulatory Visit (INDEPENDENT_AMBULATORY_CARE_PROVIDER_SITE_OTHER): Payer: Medicare Other

## 2022-11-10 ENCOUNTER — Encounter: Payer: Self-pay | Admitting: Cardiology

## 2022-11-10 ENCOUNTER — Ambulatory Visit: Payer: Medicare Other | Attending: Cardiology | Admitting: Cardiology

## 2022-11-10 VITALS — BP 130/80 | HR 92 | Ht 66.0 in | Wt 149.0 lb

## 2022-11-10 DIAGNOSIS — I5032 Chronic diastolic (congestive) heart failure: Secondary | ICD-10-CM | POA: Diagnosis present

## 2022-11-10 DIAGNOSIS — I495 Sick sinus syndrome: Secondary | ICD-10-CM | POA: Diagnosis not present

## 2022-11-10 DIAGNOSIS — I110 Hypertensive heart disease with heart failure: Secondary | ICD-10-CM | POA: Insufficient documentation

## 2022-11-10 DIAGNOSIS — I4821 Permanent atrial fibrillation: Secondary | ICD-10-CM | POA: Insufficient documentation

## 2022-11-10 DIAGNOSIS — Z950 Presence of cardiac pacemaker: Secondary | ICD-10-CM | POA: Diagnosis not present

## 2022-11-10 DIAGNOSIS — Z7901 Long term (current) use of anticoagulants: Secondary | ICD-10-CM | POA: Insufficient documentation

## 2022-11-10 LAB — CUP PACEART REMOTE DEVICE CHECK
Battery Remaining Longevity: 96 mo
Battery Voltage: 2.99 V
Brady Statistic AS VP Percent: 0.16 %
Brady Statistic AS VS Percent: 0.02 %
Brady Statistic RV Percent Paced: 0.77 %
Date Time Interrogation Session: 20240111155013
Implantable Pulse Generator Implant Date: 20200210
Lead Channel Impedance Value: 580 Ohm
Lead Channel Pacing Threshold Amplitude: 0.625 V
Lead Channel Pacing Threshold Pulse Width: 0.24 ms
Lead Channel Sensing Intrinsic Amplitude: 22.05 mV
Lead Channel Setting Pacing Amplitude: 1.5 V
Lead Channel Setting Pacing Pulse Width: 0.24 ms
Lead Channel Setting Sensing Sensitivity: 2 mV

## 2022-11-10 MED ORDER — VERAPAMIL HCL ER 180 MG PO TBCR: 180.0000 mg | EXTENDED_RELEASE_TABLET | Freq: Two times a day (BID) | ORAL | 3 refills | Status: DC

## 2022-11-10 MED ORDER — METOPROLOL SUCCINATE ER 25 MG PO TB24: 25.0000 mg | ORAL_TABLET | Freq: Every day | ORAL | 3 refills | Status: DC

## 2022-11-10 NOTE — Patient Instructions (Signed)
Medication Instructions:  Your physician has recommended you make the following change in your medication:   START: Toprol XL 25 mg daily START: Calan 180 mg twice daily  *If you need a refill on your cardiac medications before your next appointment, please call your pharmacy*   Lab Work: None If you have labs (blood work) drawn today and your tests are completely normal, you will receive your results only by: [place] (if you have MyChart) OR A paper copy in the mail If you have any lab test that is abnormal or we need to change your treatment, we will call you to review the results.   Testing/Procedures: None   Follow-Up: At [facility], you and your health needs are our priority.  As part of our continuing mission to provide you with exceptional heart care, we have created designated Provider Care Teams.  These Care Teams include your primary Cardiologist (physician) and Advanced Practice Providers (APPs -  Physician Assistants and Nurse Practitioners) who all work together to provide you with the care you need, when you need it.  We recommend signing up for the patient portal called "MyChart".  Sign up information is provided on this After Visit Summary.  MyChart is used to connect with patients for Virtual Visits (Telemedicine).  Patients are able to view lab/test results, encounter notes, upcoming appointments, etc.  Non-urgent messages can be sent to your provider as well.   To learn more about what you can do with MyChart, go to [email or web address].    Your next appointment:   6 month(s)  Provider:   Shirlee More, MD    Other Instructions Check home heart rates and if consistently < 80 notify Dr. Bettina Gavia

## 2022-11-11 ENCOUNTER — Telehealth: Payer: Self-pay | Admitting: Cardiology

## 2022-11-11 NOTE — Telephone Encounter (Signed)
Pt c/o medication issue:  1. Name of Medication:   verapamil (CALAN SR) 180 MG CR tablet   2. How are you currently taking this medication (dosage and times per day)?  As prescribed  3. Are you having a reaction (difficulty breathing--STAT)?   4. What is your medication issue?   Patient stated she started taking her new BP medication and her HR reading today was below 80.  Patient would like advice on if her BP continues to drop below 80 over the weekend.

## 2022-11-11 NOTE — Telephone Encounter (Signed)
Patient requesting to cancel her cardioversion.  Please advise.

## 2022-11-12 ENCOUNTER — Telehealth: Payer: Self-pay | Admitting: Home Health

## 2022-11-12 NOTE — Telephone Encounter (Signed)
Patient called after hour line, states her HR is 70-80s persistently, she has no symptoms or complaints.  She was concerned because her cardiologist Dr. Bettina Gavia told her to call back if her heart rate is less than 80.  She had a recent reduce of metoprolol from 50 to 25 mg daily and increase of verapamil from 120 to 180 mg twice daily.  Given she is asymptomatic with heart rate of 70 to 80s, there is no change of medical therapy indicated at this time, advised patient to call Dr. Bettina Gavia office on Monday, 11/14/2022 for further instruction if she has more concerns, patient has agreed.

## 2022-11-14 ENCOUNTER — Telehealth: Payer: Self-pay | Admitting: Internal Medicine

## 2022-11-14 ENCOUNTER — Telehealth: Payer: Self-pay | Admitting: Cardiology

## 2022-11-14 DIAGNOSIS — I4821 Permanent atrial fibrillation: Secondary | ICD-10-CM

## 2022-11-14 NOTE — Telephone Encounter (Signed)
Per information seen on schedule in EPIC, Pt's DCCV has already been cancelled?  No follow up required at this time.

## 2022-11-14 NOTE — Telephone Encounter (Signed)
Patient states she would like to cancel a cardioversion that is scheduled for 1/18.

## 2022-11-14 NOTE — Telephone Encounter (Signed)
Patient stated she called Friday about her heart rate being under 80- she stated Dr. Bettina Gavia changed her meds Thursday and told her to call if HR was below 80. She stated her HR has been consistently under 80bpm all weekend. She is requesting someone call her back.

## 2022-11-15 ENCOUNTER — Telehealth: Payer: Self-pay | Admitting: *Deleted

## 2022-11-15 ENCOUNTER — Ambulatory Visit: Payer: Medicare Other | Attending: Cardiology

## 2022-11-15 ENCOUNTER — Ambulatory Visit: Payer: Medicare Other

## 2022-11-15 DIAGNOSIS — I4821 Permanent atrial fibrillation: Secondary | ICD-10-CM

## 2022-11-15 NOTE — Telephone Encounter (Signed)
Cardioversion canceled.

## 2022-11-15 NOTE — Telephone Encounter (Signed)
Spoke with pt and asked her to wear the Zio monitor for 7 days rather than 3 per Dr. Bettina Gavia. She verbalized understanding and circled her calendar to remove monitor on the 23rd of Jan.

## 2022-11-15 NOTE — Addendum Note (Signed)
Addended by: Jerl Santos R on: 11/15/2022 03:16 PM   Modules accepted: Orders

## 2022-11-17 ENCOUNTER — Ambulatory Visit (HOSPITAL_COMMUNITY): Admit: 2022-11-17 | Payer: Medicare Other | Admitting: Internal Medicine

## 2022-11-17 ENCOUNTER — Encounter (HOSPITAL_COMMUNITY): Payer: Self-pay

## 2022-11-17 SURGERY — CARDIOVERSION
Anesthesia: General

## 2022-11-17 NOTE — Telephone Encounter (Signed)
Called patient to set-up a monitor visit to have the heart monitor put on. She reported that it was put on a couple of days ago and she had no further questions at this time.

## 2022-11-28 ENCOUNTER — Telehealth: Payer: Self-pay | Admitting: Cardiology

## 2022-11-28 NOTE — Telephone Encounter (Signed)
Patient states she was told that when her heart rate is below 80 to call office. She states her heart rate today is 71. She reports ever since stating the verapamil, her heart rate has been a little lower and she has had some swelling in her lower extremities.. She did say she has some SOB, but not at the moment of the call. She states the SOB is only when she walks around.  Please advise

## 2022-11-28 NOTE — Telephone Encounter (Signed)
  Pt c/o swelling: STAT is pt has developed SOB within 24 hours  If swelling, where is the swelling located? Ankles and legs   How much weight have you gained and in what time span? Not sure   Have you gained 3 pounds in a day or 5 pounds in a week? Not sure   Do you have a log of your daily weights (if so, list)? Did not take   Are you currently taking a fluid pill? Yes   Are you currently SOB? No   Have you traveled recently? No   Pt states she was told that when her hr is below 80 to call office. She states her hr today is 71. She reports ever since stating the verapamil, her hr has been a little lower and she has had some swelling. She did say she has some SOB, but not at the moment of the call. She states the SOB is only when she walks around.

## 2022-11-30 ENCOUNTER — Other Ambulatory Visit: Payer: Self-pay

## 2022-11-30 MED ORDER — VERAPAMIL HCL ER 180 MG PO TBCR: 180.0000 mg | EXTENDED_RELEASE_TABLET | Freq: Every day | ORAL | 3 refills | Status: DC

## 2022-11-30 NOTE — Progress Notes (Signed)
Remote pacemaker transmission.   

## 2022-11-30 NOTE — Telephone Encounter (Signed)
Called patient and informed her of Dr. Joya Gaskins recommendation below:  "Please confirm her medication for verapamil, epic says she takes it twice daily.  If that is the case reduced to once per day. "  Patient was agreeable with this plan and had no further questions at this time.

## 2022-12-01 ENCOUNTER — Encounter: Payer: Medicare Other | Admitting: Physician Assistant

## 2022-12-05 ENCOUNTER — Other Ambulatory Visit: Payer: Self-pay

## 2022-12-05 ENCOUNTER — Telehealth: Payer: Self-pay | Admitting: Cardiology

## 2022-12-05 DIAGNOSIS — I4821 Permanent atrial fibrillation: Secondary | ICD-10-CM

## 2022-12-05 NOTE — Telephone Encounter (Signed)
Pt c/o medication issue:  1. Name of Medication: Verapamil 180 mg  2. How are you currently taking this medication (dosage and times per day)? 1 time a day  3. Are you having a reaction (difficulty breathing--STAT)?   4. What is your medication issue? Heart rate is low- yesterday it was 57, this morning it was 106, but hen she is sitting, it is 58- she wants to be seen today .

## 2022-12-05 NOTE — Telephone Encounter (Signed)
Called patient and she reported that her resting heart rate has been 58 and with exertion her heart rate increases to 105 - 125. She is becoming more SOB and has lower extremity swelling in both extremities. She has a history of SVT and A-fib. She also reported that she has had chest pain with exertion in the center of her chest a couple of times but not currently. Please advise.

## 2022-12-05 NOTE — Telephone Encounter (Signed)
Called patient and informed her of Dr. Joya Gaskins recommendation below:  "She has rate controlled atrial fibrillation I think she should to see EP but sounds like she does not feel well because of it and I think this is the best approach."  Patient was agreeable with this plan and hd no further questions at this time.

## 2022-12-29 ENCOUNTER — Encounter: Payer: Self-pay | Admitting: Endocrinology

## 2022-12-29 ENCOUNTER — Ambulatory Visit (INDEPENDENT_AMBULATORY_CARE_PROVIDER_SITE_OTHER): Payer: Medicare Other | Admitting: Endocrinology

## 2022-12-29 VITALS — BP 114/68 | HR 69 | Ht 66.0 in | Wt 150.0 lb

## 2022-12-29 DIAGNOSIS — E890 Postprocedural hypothyroidism: Secondary | ICD-10-CM

## 2022-12-29 LAB — T4, FREE: Free T4: 1.45 ng/dL (ref 0.60–1.60)

## 2022-12-29 LAB — TSH: TSH: 0.12 u[IU]/mL — ABNORMAL LOW (ref 0.35–5.50)

## 2022-12-29 NOTE — Progress Notes (Signed)
Patient ID: April Padilla, female   DOB: 10-Nov-1945, 77 y.o.   MRN: [id number]               Reason for Appointment: Endocrinology follow-up visit    History of Present Illness:   Problem 1:  Hypothyroidism was first diagnosed in the 1990s after treatment of hyperthyroidism with radioactive iodine Details are not available She thinks that for several years she had been stable on 88 g of levothyroxine  In 07/2017 she had a routine TSH test and this was found to be high at 7.9 Subsequently her levothyroxine doses have been very well between 112 and 125 mcg   She tends to have chronic symptoms of fatigue which she thinks is from her cardiac arrhythmia Also has unrelated tremor at times  In 6/22 when she was on 100 mcg levothyroxine her TSH was high normal at 3.8  She was changed to EUTHYROX 100 mcg in 6/22 and the dose has been adjusted periodically Since her TSH was significantly high in November her dose has been increased to 125 mcg She may have also had a couple of missed doses before that because of her knee surgery  She is getting the brand-name Euthyrox from her [facility] She is regular with taking levothyroxine in the morning an hour before breakfast with water only  TSH done by PCP in December was 5.8  Labs be checked today         Patient's weight history is as follows:  Wt Readings from Last 3 Encounters:  12/29/22 150 lb (68 kg)  11/10/22 149 lb (67.6 kg)  11/02/22 148 lb (67.1 kg)    Thyroid function results have been as follows:  Lab Results  Component Value Date   TSH 15.51 (H) 09/29/2022   TSH 1.13 03/29/2022   TSH 5.54 (H) 09/29/2021   TSH 0.43 06/28/2021   FREET4 1.20 09/29/2022   FREET4 1.56 03/29/2022   FREET4 1.30 09/29/2021   FREET4 1.43 06/28/2021   T3FREE 2.5 09/16/2020    HYPERCALCEMIA: See review of systems    Past Medical History:  Diagnosis Date   Cervical intraepithelial neoplasia grade III with severe dysplasia  10/31/2000   Chest discomfort 12/23/2018   Chronic atrial fibrillation ([place]) 01/26/2016   Last Assessment & Plan:  Has had ablation through [facility] , her BP here was stable , discussed her current meds and the S/E , she is off of the lasix now and will see her CMP for her as well as CK today for her muscle cramps she has had long before her ablation with possible pain from the statin   Chronic fatigue 07/25/2016   Chronic idiopathic constipation 01/26/2016   Dysrhythmia    on toprol for fast heart beat   Encounter for care of pacemaker 12/19/2018   Essential (primary) hypertension 01/26/2016   Family history of colon cancer in mother 07/17/2017   GERD (gastroesophageal reflux disease) 07/17/2017   H/O hiatal hernia    High risk medication use 01/26/2016   History of adenomatous polyp of colon 07/17/2017   Hypercalcemia 01/26/2016   Hypothyroidism    pt. states she took radioactive pill and has no thyroid   Irregular heart rate 07/06/2018   Left shoulder pain 07/06/2018   Long term current use of anticoagulant 07/06/2018   Mixed hyperlipidemia 01/26/2016   Myalgia 05/18/2016   Last Assessment & Plan:  Formatting of this note might be different from the original. 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 ago   left foot   FOOT SURGERY  2009   took knot out  and put in screws   KNEE ARTHROSCOPY W/ MENISCAL REPAIR  2009   left   TOTAL KNEE ARTHROPLASTY  01/16/2012   Procedure: TOTAL KNEE ARTHROPLASTY;  Surgeon: Rudean Haskell, MD;  Location: [facility];  Service: Orthopedics;  Laterality: Left;    Family History  Problem Relation Age of Onset   Cancer Mother    Thyroid disease Mother    Stroke Father    Anesthesia problems Neg Hx    Hypotension Neg Hx    Malignant hyperthermia Neg Hx    Pseudochol deficiency Neg Hx    Sleep apnea Neg Hx     Social History:  reports that she has never smoked. She has never used smokeless tobacco. She reports that she does not drink alcohol and does not use drugs.  Allergies:  Allergies  Allergen Reactions   Clindamycin/Lincomycin Rash   Nitrofurantoin Monohyd Macro Rash   Silver Sulfadiazine Rash    Allergies as of 12/29/2022       Reactions   Clindamycin/lincomycin Rash   Nitrofurantoin Monohyd Macro Rash   Silver Sulfadiazine Rash        Medication List        Accurate as of December 29, 2022 11:17 AM. If you have any questions, ask your nurse or doctor.          ALPRAZolam 0.5 MG tablet Commonly known as: XANAX Take 0.5 mg by mouth daily as needed for anxiety or sleep.   apixaban 5 MG Tabs tablet Commonly known as: ELIQUIS Take 5 mg by mouth 2 (two) times daily.   cholecalciferol 25 MCG (1000 UNIT) tablet Commonly known as: VITAMIN D3 Take 2,000 Units by mouth daily.   Euthyrox 125 MCG tablet Generic drug: levothyroxine Take 1 tablet (125 mcg total) by mouth daily before breakfast.   famotidine 20 MG tablet Commonly known as: PEPCID Take 1 tablet by mouth in the morning and at bedtime.   furosemide 20 MG tablet Commonly known as: LASIX Take 1 tablet (20 mg total)  by mouth as needed for fluid or edema.   magnesium oxide 400 MG tablet Commonly known as: MAG-OX Take 400 mg by mouth daily.   metoprolol succinate 25 MG 24 hr tablet Commonly known as: Toprol XL Take 1 tablet (25 mg total) by mouth daily.   rosuvastatin 5 MG tablet Commonly known as: CRESTOR Take 5 mg by mouth daily.   verapamil 180 MG CR tablet Commonly known as: CALAN-SR Take 1 tablet (180 mg total) by mouth at bedtime.  Review of Systems  HYPERCALCEMIA: She has mild hyperparathyroidism with variable increase in calcium levels Previously had increased PTH level  also  Highest calcium has been 10.8 previously Subsequently appears to be upper normal consistently   Lab Results  Component Value Date   PTH 76 (H) 11/24/2017   CALCIUM 10.3 11/02/2022    OSTEOPENIA: In 2019 she was found to have osteopenia as follows, this was her first bone density  Bone density in 08/2020 is stable to improved, has not been on any medications for this    Lumbar spine L1-L4 Femoral neck (FN) 33% distal radius  T-score   -1.3 RFN: -1.6 LFN: -1.8 -2.5  Change in BMD from previous DXA test (%) Up 3.5%* Up 2.0% Down 0.5%  (*) statistically significant  In 05/2018:    Lumbar spine L1-L4 Femoral neck (FN) 33% distal radius  T-score -1.6 RFN: -1.5 LFN: -2.1 -2.5   VITAMIN D deficiency:  She was found to have a low vitamin D level of 15 in 10/2017  She is taking OTC vitamin D3, 2000 units daily    Lab Results  Component Value Date   VD25OH 31.60 07/14/2020   VD25OH 37.79 06/17/2019   VD25OH 33.12 05/31/2018               Examination:    BP 114/68 (BP Location: Left Arm, Patient Position: Sitting, Cuff Size: Small)   Pulse 69   Ht '5\' 6"'$  (1.676 m)   Wt 150 lb (68 kg)   SpO2 98%   BMI 24.21 kg/m     Assessment:  HYPOTHYROIDISM, secondary to I-131 ablation of thyroid for hyperthyroidism  She has had somewhat variable thyroid levels   She is taking  Euthyrox brand, 125 mcg Difficult to assess her symptoms because of intercurrent illnesses and ongoing issues with her cardiac rhythm She has been regular with her supplement now   PLAN:   TSH to be checked today Her dose will be adjusted accordingly, follow-up in 4 months if no changes made   There are no Patient Instructions on file for this visit.    Elayne Snare 12/29/2022, 11:17 AM     Note: This office note was prepared with Dragon voice recognition system technology. Any transcriptional errors that result from this process are unintentional.  Addendum: TSH 15, she will now go to 125 mcg levothyroxine

## 2023-01-02 ENCOUNTER — Ambulatory Visit: Payer: Medicare Other | Attending: Cardiology | Admitting: Cardiology

## 2023-01-02 ENCOUNTER — Encounter: Payer: Self-pay | Admitting: Cardiology

## 2023-01-02 ENCOUNTER — Other Ambulatory Visit: Payer: Self-pay

## 2023-01-02 VITALS — BP 130/80 | HR 68 | Ht 66.0 in | Wt 148.0 lb

## 2023-01-02 DIAGNOSIS — D6869 Other thrombophilia: Secondary | ICD-10-CM | POA: Diagnosis not present

## 2023-01-02 DIAGNOSIS — I4819 Other persistent atrial fibrillation: Secondary | ICD-10-CM | POA: Insufficient documentation

## 2023-01-02 DIAGNOSIS — E890 Postprocedural hypothyroidism: Secondary | ICD-10-CM

## 2023-01-02 MED ORDER — LEVOTHYROXINE SODIUM 112 MCG PO TABS: 112.0000 ug | ORAL_TABLET | Freq: Every day | ORAL | 1 refills | Status: DC

## 2023-01-02 NOTE — Patient Instructions (Signed)
Medication Instructions:  Your physician recommends that you continue on your current medications as directed. Please refer to the Current Medication list given to you today.  *If you need a refill on your cardiac medications before your next appointment, please call your pharmacy*   Lab Work: None ordered   Testing/Procedures: None ordered   Follow-Up: At [facility], you and your health needs are our priority.  As part of our continuing mission to provide you with exceptional heart care, we have created designated Provider Care Teams.  These Care Teams include your primary Cardiologist (physician) and Advanced Practice Providers (APPs -  Physician Assistants and Nurse Practitioners) who all work together to provide you with the care you need, when you need it.  Remote monitoring is used to monitor your Pacemaker or ICD from home. This monitoring reduces the number of office visits required to check your device to one time per year. It allows [place] to keep an eye on the functioning of your device to ensure it is working properly. You are scheduled for a device check from home on 02/09/2023. You may send your transmission at any time that day. If you have a wireless device, the transmission will be sent automatically. After your physician reviews your transmission, you will receive a postcard with your next transmission date.  Your next appointment:   6 month(s)  The format for your next appointment:   In Person  Provider:   Dr. Lovena Le    Thank you for choosing [facility]!!   [phone]

## 2023-01-02 NOTE — Progress Notes (Signed)
Electrophysiology Office Note   Date:  01/02/2023   ID:  April Padilla, April Padilla 1945/11/12, MRN [id number]  PCP:  [name], MD  Cardiologist:  Bettina Gavia Primary Electrophysiologist:  Jeanie Mccard Meredith Leeds, MD    Chief Complaint: AF   History of Present Illness: April Padilla is a 77 y.o. female who is being seen today for the evaluation of AF at the request of Bettina Gavia, [name], MD. Presenting today for electrophysiology evaluation.  He has a history of atrial fibrillation.  She had cryoablation in 2016 with Dr. Elonda Husky.  She did well initially but unfortunately developed recurrent episodes of atrial fibrillation.  She failed medical therapy and underwent repeat ablation in 2017.  She was noted to have isolated pulmonary vein potentials at the time.  She underwent posterior wall isolation.  She then had atrial tachycardia from both the left and right atria and was started on dofetilide.  She had a Medtronic Micra implanted for symptomatic bradycardia.  Today, denies symptoms of palpitations, chest pain, shortness of breath, orthopnea, PND, lower extremity edema, claudication, dizziness, presyncope, syncope, bleeding, or neurologic sequela. The patient is tolerating medications without difficulties.  She continues to have fatigue, though she feels that this is related to her atrial fibrillation.  She was initially scheduled for cardioversion but this was canceled.  She has chosen a rate control strategy.    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   Chest discomfort 12/23/2018   Chronic atrial fibrillation ([place]) 01/26/2016   Last Assessment & Plan:  Has had ablation through [facility] , her BP here was stable , discussed her current meds and the S/E , she is off of the lasix now and [name] see her CMP for her as well as CK today for her muscle cramps she has had long before her ablation with possible pain from the statin    Chronic fatigue 07/25/2016   Chronic idiopathic constipation 01/26/2016   Dysrhythmia    on toprol for fast heart beat   Encounter for care of pacemaker 12/19/2018   Essential (primary) hypertension 01/26/2016   Family history of colon cancer in mother 07/17/2017   GERD (gastroesophageal reflux disease) 07/17/2017   H/O hiatal hernia    High risk medication use 01/26/2016   History of adenomatous polyp of colon 07/17/2017   Hypercalcemia 01/26/2016   Hypothyroidism    pt. states she took radioactive pill and has no thyroid   Irregular heart rate 07/06/2018   Left shoulder pain 07/06/2018   Long term current use of anticoagulant 07/06/2018   Mixed hyperlipidemia 01/26/2016   Myalgia 05/18/2016   Last Assessment & Plan:  Formatting of this note might be different from the original. 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   Presence of left artificial knee joint 01/22/2015   Primary generalized (osteo)arthritis 01/26/2016   Primary hyperparathyroidism ([place]) 11/26/2017   Primary osteoarthritis involving multiple joints 01/26/2016   PSVT (paroxysmal supraventricular tachycardia) 01/26/2016   Recurrent urticaria 01/26/2016   S/P TKR (total knee replacement) 12/27/2012   Shortness of breath    with exertion   Sinobronchitis 09/07/2018   Situational anxiety 09/07/2018   Thyroid  function test abnormal 08/19/2016   Vitamin D deficiency 11/26/2017   Past Surgical History:  Procedure Laterality Date   BUNIONECTOMY  yrs. ago   left foot   FOOT SURGERY  2009   took knot out  and put in screws   KNEE ARTHROSCOPY W/ MENISCAL REPAIR  2009   left   TOTAL KNEE ARTHROPLASTY  01/16/2012   Procedure: TOTAL KNEE ARTHROPLASTY;  Surgeon: Rudean Haskell,  MD;  Location: [facility];  Service: Orthopedics;  Laterality: Left;     Current Outpatient Medications  Medication Sig Dispense Refill   ALPRAZolam (XANAX) 0.5 MG tablet Take 0.5 mg by mouth daily as needed for anxiety or sleep.     apixaban (ELIQUIS) 5 MG TABS tablet Take 5 mg by mouth 2 (two) times daily.      cholecalciferol (VITAMIN D3) 25 MCG (1000 UNIT) tablet Take 2,000 Units by mouth daily.     famotidine (PEPCID) 20 MG tablet Take 1 tablet by mouth in the morning and at bedtime.     furosemide (LASIX) 20 MG tablet Take 1 tablet (20 mg total) by mouth as needed for fluid or edema. 90 tablet 3   levothyroxine (EUTHYROX) 112 MCG tablet Take 1 tablet (112 mcg total) by mouth daily before breakfast. 60 tablet 1   magnesium oxide (MAG-OX) 400 MG tablet Take 400 mg by mouth daily.     metoprolol succinate (TOPROL XL) 25 MG 24 hr tablet Take 1 tablet (25 mg total) by mouth daily. (Patient taking differently: Take 25 mg by mouth daily. Pt takes at bedtime) 90 tablet 3   rosuvastatin (CRESTOR) 5 MG tablet Take 5 mg by mouth daily.     verapamil (CALAN-SR) 180 MG CR tablet Take 1 tablet (180 mg total) by mouth at bedtime. (Patient taking differently: Take 180 mg by mouth at bedtime. Pt takes with breakfast) 90 tablet 3   No current facility-administered medications for this visit.    Allergies:   Clindamycin/lincomycin, Nitrofurantoin monohyd macro, and Silver sulfadiazine   Social History:  The patient  reports that she has never smoked. She has never used smokeless tobacco. She reports that she does not drink alcohol and does not use drugs.   Family History:  The patient's family history includes Cancer in her mother; Stroke in her father; Thyroid disease in her mother.    ROS:  Please see the history of present illness.   Otherwise, review of systems is positive for none.   All other systems are reviewed and negative.   PHYSICAL EXAM: VS:  BP 130/80   Pulse 68   Ht '5\' 6"'$  (1.676 m)   Wt  148 lb (67.1 kg)   SpO2 98%   BMI 23.89 kg/m  , BMI Body mass index is 23.89 kg/m. GEN: Well nourished, well developed, in no acute distress  HEENT: normal  Neck: no JVD, carotid bruits, or masses Cardiac: irregular; no murmurs, rubs, or gallops,no edema  Respiratory:  clear to auscultation bilaterally, normal work of breathing GI: soft, nontender, nondistended, + BS MS: no deformity or atrophy  Skin: warm and dry, device site well healed Neuro:  Strength and sensation are intact Psych: euthymic mood, full affect  EKG:  EKG is not ordered today. Personal review of the ekg ordered 11/02/22 shows AF  Personal review of the device interrogation today. Results in [facility]: 11/02/2022: BUN 9; Creatinine, Ser 0.88; Hemoglobin 12.2; Platelets 375; Potassium 4.1; Sodium 139 12/29/2022: TSH 0.12  Lipid Panel     Component Value Date/Time   CHOL 179 12/28/2021 1120   TRIG 143 12/28/2021 1120   HDL 87 12/28/2021 1120   CHOLHDL 2.1 12/28/2021 1120   LDLCALC 68 12/28/2021 1120     Wt Readings from Last 3 Encounters:  01/02/23 148 lb (67.1 kg)  12/29/22 150 lb (68 kg)  11/10/22 149 lb (67.6 kg)      Other studies Reviewed: Additional studies/ records that were reviewed today include: TTE 11/11/20  Review of the above records today demonstrates:   1. Left ventricular ejection fraction, by estimation, is 65 to 70%. The  left ventricle has normal function. The left ventricle has no regional  wall motion abnormalities. There is mild concentric left ventricular  hypertrophy. Diastolic function  indeterminant due to atrial fibrillation.   2. Right ventricular systolic function is normal. The right ventricular  size is moderately enlarged. There is mildly elevated pulmonary artery  systolic pressure. The estimated right ventricular systolic pressure is  123456 mmHg.   3. Left atrial size was moderately dilated.   4. Right atrial size was severely dilated.   5. The mitral  valve is normal in structure. Mild mitral valve  regurgitation.   6. The aortic valve is tricuspid. There is mild thickening of the aortic  valve. Aortic valve regurgitation is not visualized. No aortic stenosis is  present.   7. The inferior vena cava is normal in size with <50% respiratory  variability, suggesting right atrial pressure of 8 mmHg.   Cardiac monitor 12/04/2022 personally reviewed Atrial Fibrillation occurred continuously (100% burden), ranging from 47-107 bpm (avg of 70 bpm).  Heart rate response was well-controlled atrial fibrillation   There is symptomatic events noted to with brief runs of accelerated idioventricular rhythm   Idioventricular Rhythm was present. Idioventricular Rhythm was detected within +/- 45 seconds of symptomatic patient event(s).    Isolated VEs were rare (<1.0%, 1681), VE Couplets were rare (<1.0%, 232), and VE Triplets were rare (<1.0%, 90). Ventricular Bigeminy was present.   ASSESSMENT AND PLAN:  1.  Persistent atrial fibrillation: Status post ablation x 2.  Has had posterior wall isolation.  During second ablation, pulmonary veins were isolated.  Options are limited.  She does not want to be on amiodarone.  Has had a rate control strategy.  Interrogation and recent cardiac monitor, rates are well-controlled.  No changes.  2.  Symptomatic bradycardia: Status post Medtronic Micr  3.  Secondary hypercoagulable state: Currently on Eliquis for atrial fibrillation. CHA2DS2-VASc of at least 3. Current medicines are reviewed at length with the patient today.   The patient does not have concerns regarding her medicines.  The following changes were made today: None  Labs/ tests ordered today include:  No orders of the defined types were placed in this encounter.    Disposition:   FU 6 months  Signed, April Spellman Meredith Leeds, MD  01/02/2023 3:56 PM     [place] [address] [place] [address] [phone]  (office) [phone] (fax)

## 2023-01-03 ENCOUNTER — Encounter: Payer: Medicare Other | Admitting: Internal Medicine

## 2023-01-17 ENCOUNTER — Other Ambulatory Visit: Payer: Self-pay | Admitting: Endocrinology

## 2023-01-17 DIAGNOSIS — E890 Postprocedural hypothyroidism: Secondary | ICD-10-CM

## 2023-01-18 ENCOUNTER — Other Ambulatory Visit: Payer: Self-pay | Admitting: Endocrinology

## 2023-01-18 DIAGNOSIS — E890 Postprocedural hypothyroidism: Secondary | ICD-10-CM

## 2023-02-09 ENCOUNTER — Ambulatory Visit (INDEPENDENT_AMBULATORY_CARE_PROVIDER_SITE_OTHER): Payer: Medicare Other

## 2023-02-09 DIAGNOSIS — I495 Sick sinus syndrome: Secondary | ICD-10-CM | POA: Diagnosis not present

## 2023-02-10 ENCOUNTER — Other Ambulatory Visit: Payer: Self-pay | Admitting: Endocrinology

## 2023-02-10 ENCOUNTER — Other Ambulatory Visit (INDEPENDENT_AMBULATORY_CARE_PROVIDER_SITE_OTHER): Payer: Medicare Other

## 2023-02-10 DIAGNOSIS — E890 Postprocedural hypothyroidism: Secondary | ICD-10-CM

## 2023-02-10 LAB — TSH: TSH: 0.58 u[IU]/mL (ref 0.35–5.50)

## 2023-02-10 LAB — T4, FREE: Free T4: 1.28 ng/dL (ref 0.60–1.60)

## 2023-02-12 LAB — CUP PACEART REMOTE DEVICE CHECK
Battery Remaining Longevity: 96 mo
Battery Voltage: 2.99 V
Brady Statistic AS VP Percent: 0.58 %
Brady Statistic AS VS Percent: 0.08 %
Brady Statistic RV Percent Paced: 1.25 %
Date Time Interrogation Session: 20240412173813
Implantable Pulse Generator Implant Date: 20200210
Lead Channel Impedance Value: 620 Ohm
Lead Channel Pacing Threshold Amplitude: 0.75 V
Lead Channel Pacing Threshold Pulse Width: 0.24 ms
Lead Channel Sensing Intrinsic Amplitude: 24.975 mV
Lead Channel Setting Pacing Amplitude: 1.5 V
Lead Channel Setting Pacing Pulse Width: 0.24 ms
Lead Channel Setting Sensing Sensitivity: 2 mV

## 2023-02-28 ENCOUNTER — Other Ambulatory Visit: Payer: Medicare Other

## 2023-03-17 NOTE — Progress Notes (Signed)
Remote pacemaker transmission.   

## 2023-03-19 ENCOUNTER — Other Ambulatory Visit: Payer: Self-pay | Admitting: Endocrinology

## 2023-04-03 ENCOUNTER — Ambulatory Visit: Payer: Medicare Other | Attending: Cardiology | Admitting: Cardiology

## 2023-04-03 ENCOUNTER — Encounter: Payer: Self-pay | Admitting: Cardiology

## 2023-04-03 ENCOUNTER — Encounter: Payer: Self-pay | Admitting: *Deleted

## 2023-04-03 VITALS — BP 112/60 | HR 70 | Ht 66.0 in | Wt 154.0 lb

## 2023-04-03 DIAGNOSIS — R195 Other fecal abnormalities: Secondary | ICD-10-CM

## 2023-04-03 DIAGNOSIS — I4819 Other persistent atrial fibrillation: Secondary | ICD-10-CM

## 2023-04-03 DIAGNOSIS — I10 Essential (primary) hypertension: Secondary | ICD-10-CM

## 2023-04-03 DIAGNOSIS — Z950 Presence of cardiac pacemaker: Secondary | ICD-10-CM | POA: Diagnosis present

## 2023-04-03 DIAGNOSIS — D6869 Other thrombophilia: Secondary | ICD-10-CM | POA: Diagnosis not present

## 2023-04-03 DIAGNOSIS — I495 Sick sinus syndrome: Secondary | ICD-10-CM | POA: Diagnosis not present

## 2023-04-03 MED ORDER — VERAPAMIL HCL ER 180 MG PO TBCR: 90.0000 mg | EXTENDED_RELEASE_TABLET | Freq: Every day | ORAL | 3 refills | Status: DC

## 2023-04-03 NOTE — Patient Instructions (Addendum)
Medication Instructions:  Your physician has recommended you make the following change in your medication:  Decrease Verapamil to 90 mg once daily, 1/2 of the 180  *If you need a refill on your cardiac medications before your next appointment, please call your pharmacy*   Lab Work: NONE If you have labs (blood work) drawn today and your tests are completely normal, you will receive your results only by: MyChart Message (if you have MyChart) OR A paper copy in the mail If you have any lab test that is abnormal or we need to change your treatment, we will call you to review the results.   Testing/Procedures: NONE   Follow-Up: At [facility], you and your health needs are our priority.  As part of our continuing mission to provide you with exceptional heart care, we have created designated Provider Care Teams.  These Care Teams include your primary Cardiologist (physician) and Advanced Practice Providers (APPs -  Physician Assistants and Nurse Practitioners) who all work together to provide you with the care you need, when you need it.  We recommend signing up for the patient portal called "MyChart".  Sign up information is provided on this After Visit Summary.  MyChart is used to connect with patients for Virtual Visits (Telemedicine).  Patients are able to view lab/test results, encounter notes, upcoming appointments, etc.  Non-urgent messages can be sent to your provider as well.   To learn more about what you can do with MyChart, go to [email or web address].    Your next appointment:  Keep appointment with Dr. Dulce Sellar    Provider:   Norman Herrlich, MD    Other Instructions Referred to Dr. Marcial Pacas [place]:  [address] [place], [place] [address] Hours:  Open ? Closes 5:15?PM Phone: [phone]

## 2023-04-03 NOTE — Progress Notes (Signed)
Cardiology Office Note:    Date:  04/03/2023   ID:  April Padilla, DOB May 29, 1946, MRN [id number]  PCP:  Hadley Pen, MD   [facility] Providers Cardiologist:  Norman Herrlich, MD Electrophysiologist:  Lewayne Bunting, MD     Referring MD: Hadley Pen, MD   CC: BP low  History of Present Illness:    April Padilla is a 77 y.o. female with a hx of chronic atrial fibrillation CHA2DS2-VASc score of 4 on Eliquis, hypertension, PSVT, sick sinus syndrome s/p PPM, GERD, hyperparathyroidism, hypothyroidism, hyperlipidemia.  S/p cryoablation in 2016 with Dr. Chales Abrahams, initially did well however developed recurrent episodes of atrial fibrillation.  Repeat ablation in 2017.  Developed atrial tachycardia after this and was started on flecainide.  S/p Medtronic Micra implanted for symptomatic bradycardia in 2020.  Echocardiogram on 11/11/2020 revealed EF 65 to 70%, RV moderately enlarged, mildly elevated PASP 41 mmHg, mild MR.  Long-term monitor in January 2024 revealed A-fib burden was 100%.  Most recently she was evaluated by Dr. Elberta Fortis on 01/02/2023, at this time she was doing well although continued to have fatigue which she felt was related to her atrial fibrillation.  She had decided to proceed with rate control strategies for this.   Normal device check on 02/13/2023.  She presents today with concerns of low blood pressure readings.  She keeps a log at home, presents for review, her systolic blood pressure is ranging from 80-104.  This is accompanied by feelings of being drained and fatigue.  She denies dizziness, syncope, orthostasis.  She has recently started to workout again at a local senior center, able to complete this okay however she just feels drained. She denies chest pain, palpitations, dyspnea, pnd, orthopnea, n, v, dizziness, syncope, edema, weight gain, or early satiety.   Past Medical History:  Diagnosis Date   Cervical intraepithelial neoplasia grade III with  severe dysplasia 10/31/2000   Chest discomfort 12/23/2018   Chronic atrial fibrillation (HCC) 01/26/2016   Last Assessment & Plan:  Has had ablation through [facility] , her BP here was stable , discussed her current meds and the S/E , she is off of the lasix now and will see her CMP for her as well as CK today for her muscle cramps she has had long before her ablation with possible pain from the statin   Chronic fatigue 07/25/2016   Chronic idiopathic constipation 01/26/2016   Dysrhythmia    on toprol for fast heart beat   Encounter for care of pacemaker 12/19/2018   Essential (primary) hypertension 01/26/2016   Family history of colon cancer in mother 07/17/2017   GERD (gastroesophageal reflux disease) 07/17/2017   H/O hiatal hernia    High risk medication use 01/26/2016   History of adenomatous polyp of colon 07/17/2017   Hypercalcemia 01/26/2016   Hypothyroidism    pt. states she took radioactive pill and has no thyroid   Irregular heart rate 07/06/2018   Left shoulder pain 07/06/2018   Long term current use of anticoagulant 07/06/2018   Mixed hyperlipidemia 01/26/2016   Myalgia 05/18/2016   Last Assessment & Plan:  Formatting of this note might be different from the original. Will update her CMP for her and ensure that her potassium and her magnesium are stable for her with the recent myalgias and fatigue post procedure for her   On continuous oral anticoagulation 08/09/2016   On dofetilide therapy 01/29/2018   Pain of left hip joint 11/22/2018   Postablative  hypothyroidism 11/24/2017   Presence of left artificial knee joint 01/22/2015   Primary generalized (osteo)arthritis 01/26/2016   Primary hyperparathyroidism (HCC) 11/26/2017   Primary osteoarthritis involving multiple joints 01/26/2016   PSVT (paroxysmal supraventricular tachycardia) 01/26/2016   Recurrent urticaria 01/26/2016   S/P TKR (total knee replacement) 12/27/2012   Shortness of breath    with exertion   Sinobronchitis 09/07/2018   Situational  anxiety 09/07/2018   Thyroid function test abnormal 08/19/2016   Vitamin D deficiency 11/26/2017    Past Surgical History:  Procedure Laterality Date   BUNIONECTOMY  yrs. ago   left foot   FOOT SURGERY  2009   took knot out  and put in screws   KNEE ARTHROSCOPY W/ MENISCAL REPAIR  2009   left   TOTAL KNEE ARTHROPLASTY  01/16/2012   Procedure: TOTAL KNEE ARTHROPLASTY;  Surgeon: Raymon Mutton, MD;  Location: [facility] OR;  Service: Orthopedics;  Laterality: Left;    Current Medications: Current Meds  Medication Sig   ALPRAZolam (XANAX) 0.5 MG tablet Take 0.5 mg by mouth daily as needed for anxiety or sleep.   apixaban (ELIQUIS) 5 MG TABS tablet Take 5 mg by mouth 2 (two) times daily.    cholecalciferol (VITAMIN D3) 25 MCG (1000 UNIT) tablet Take 2,000 Units by mouth daily.   famotidine (PEPCID) 20 MG tablet Take 1 tablet by mouth in the morning and at bedtime.   furosemide (LASIX) 20 MG tablet Take 1 tablet (20 mg total) by mouth as needed for fluid or edema.   levothyroxine (SYNTHROID) 112 MCG tablet TAKE 1 TABLET BY MOUTH DAILY BEFORE BREAKFAST.   magnesium oxide (MAG-OX) 400 MG tablet Take 400 mg by mouth daily.   metoprolol succinate (TOPROL XL) 25 MG 24 hr tablet Take 1 tablet (25 mg total) by mouth daily. (Patient taking differently: Take 25 mg by mouth daily. Pt takes at bedtime)   rosuvastatin (CRESTOR) 5 MG tablet Take 5 mg by mouth daily.   verapamil (CALAN-SR) 180 MG CR tablet Take 0.5 tablets (90 mg total) by mouth at bedtime.   [DISCONTINUED] verapamil (CALAN-SR) 180 MG CR tablet Take 1 tablet (180 mg total) by mouth at bedtime. (Patient taking differently: Take 180 mg by mouth at bedtime. Pt takes with breakfast)     Allergies:   Clindamycin/lincomycin, Nitrofurantoin monohyd macro, and Silver sulfadiazine   Social History   Socioeconomic History   Marital status: Married    Spouse name: Not on file   Number of children: Not on file   Years of education: Not on file    Highest education level: Not on file  Occupational History   Not on file  Tobacco Use   Smoking status: Never   Smokeless tobacco: Never  Substance and Sexual Activity   Alcohol use: No    Comment: occasional   Drug use: No   Sexual activity: Yes  Other Topics Concern   Not on file  Social History Narrative   Not on file   Social Determinants of Health   Financial Resource Strain: Not on file  Food Insecurity: Not on file  Transportation Needs: Not on file  Physical Activity: Insufficiently Active (11/24/2017)   Exercise Vital Sign    Days of Exercise per Week: 2 days    Minutes of Exercise per Session: 20 min  Stress: Not on file  Social Connections: Not on file     Family History: The patient's family history includes Cancer in her mother; Stroke in her father;  Thyroid disease in her mother. There is no history of Anesthesia problems, Hypotension, Malignant hyperthermia, Pseudochol deficiency, or Sleep apnea.  ROS:   Please see the history of present illness.     All other systems reviewed and are negative.  EKGs/Labs/Other Studies Reviewed:    The following studies were reviewed today: Cardiac Studies & Procedures       ECHOCARDIOGRAM  ECHOCARDIOGRAM COMPLETE 11/11/2020  Narrative ECHOCARDIOGRAM REPORT    Patient Name:   JOSMARY RAIOLA Date of Exam: 11/11/2020 Medical Rec #:  [id number]        Height:       65.0 in Accession #:    [id number]       Weight:       164.4 lb Date of Birth:  18-Mar-1946        BSA:          1.820 m Patient Age:    74 years         BP:           129/74 mmHg Patient Gender: F                HR:           91 bpm. Exam Location:  [address]  Procedure: 2D Echo, Cardiac Doppler and Color Doppler  Indications:    I11.9  History:        Patient has no prior history of Echocardiogram examinations. Hypertensive heart disease w/o heart failure, Pacemaker, Arrythmias:Atrial Fibrillation; Risk Factors:Hypertension  and Dyslipidemia.  Sonographer:    Samule Ohm RDCS Referring Phys: [id number] BRIAN J MUNLEY  IMPRESSIONS   1. Left ventricular ejection fraction, by estimation, is 65 to 70%. The left ventricle has normal function. The left ventricle has no regional wall motion abnormalities. There is mild concentric left ventricular hypertrophy. Diastolic function indeterminant due to atrial fibrillation. 2. Right ventricular systolic function is normal. The right ventricular size is moderately enlarged. There is mildly elevated pulmonary artery systolic pressure. The estimated right ventricular systolic pressure is 41.4 mmHg. 3. Left atrial size was moderately dilated. 4. Right atrial size was severely dilated. 5. The mitral valve is normal in structure. Mild mitral valve regurgitation. 6. The aortic valve is tricuspid. There is mild thickening of the aortic valve. Aortic valve regurgitation is not visualized. No aortic stenosis is present. 7. The inferior vena cava is normal in size with <50% respiratory variability, suggesting right atrial pressure of 8 mmHg.  Comparison(s): No prior Echocardiogram.  FINDINGS Left Ventricle: Left ventricular ejection fraction, by estimation, is 65 to 70%. The left ventricle has normal function. The left ventricle has no regional wall motion abnormalities. The left ventricular internal cavity size was normal in size. There is mild concentric left ventricular hypertrophy. Diastolic function indeterminant due to atrial fibrillation.  Right Ventricle: The right ventricular size is moderately enlarged. Right vetricular wall thickness was not well visualized. Right ventricular systolic function is normal. There is mildly elevated pulmonary artery systolic pressure. The tricuspid regurgitant velocity is 2.89 m/s, and with an assumed right atrial pressure of 8 mmHg, the estimated right ventricular systolic pressure is 41.4 mmHg.  Left Atrium: Left atrial size was moderately  dilated.  Right Atrium: Right atrial size was severely dilated.  Pericardium: There is no evidence of pericardial effusion.  Mitral Valve: The mitral valve is normal in structure. Mild mitral valve regurgitation.  Tricuspid Valve: The tricuspid valve is normal in structure. Tricuspid valve regurgitation is mild.  Aortic Valve:  The aortic valve is tricuspid. There is mild thickening of the aortic valve. Aortic valve regurgitation is not visualized. No aortic stenosis is present.  Pulmonic Valve: The pulmonic valve was normal in structure. Pulmonic valve regurgitation is mild.  Aorta: The aortic root and ascending aorta are structurally normal, with no evidence of dilitation.  Venous: The inferior vena cava is normal in size with less than 50% respiratory variability, suggesting right atrial pressure of 8 mmHg.  IAS/Shunts: No atrial level shunt detected by color flow Doppler.  Additional Comments: A pacer wire is visualized.   LEFT VENTRICLE PLAX 2D LVIDd:         4.20 cm  Diastology LVIDs:         2.10 cm  LV e' medial:    7.18 cm/s LV PW:         0.90 cm  LV E/e' medial:  18.2 LV IVS:        1.20 cm  LV e' lateral:   10.87 cm/s LVOT diam:     1.70 cm  LV E/e' lateral: 12.0 LV SV:         40 LV SV Index:   22 LVOT Area:     2.27 cm   RIGHT VENTRICLE             IVC RV S prime:     10.78 cm/s  IVC diam: 1.60 cm TAPSE (M-mode): 1.8 cm RVSP:           36.4 mmHg  LEFT ATRIUM             Index       RIGHT ATRIUM           Index LA diam:        4.60 cm 2.53 cm/m  RA Pressure: 3.00 mmHg LA Vol (A2C):   84.8 ml 46.59 ml/m RA Area:     19.50 cm LA Vol (A4C):   57.0 ml 31.32 ml/m RA Volume:   57.30 ml  31.48 ml/m LA Biplane Vol: 71.7 ml 39.40 ml/m AORTIC VALVE LVOT Vmax:   90.18 cm/s LVOT Vmean:  58.380 cm/s LVOT VTI:    0.178 m  AORTA Ao Root diam: 3.00 cm Ao Asc diam:  2.90 cm  MV E velocity: 130.40 cm/s  TRICUSPID VALVE TR Peak grad:   33.4 mmHg TR Vmax:         289.00 cm/s Estimated RAP:  3.00 mmHg RVSP:           36.4 mmHg  SHUNTS Systemic VTI:  0.18 m Systemic Diam: 1.70 cm  Laurance Flatten MD Electronically signed by Laurance Flatten MD Signature Date/Time: 11/11/2020/2:46:36 PM    Final    MONITORS  LONG TERM MONITOR (3-14 DAYS) 12/04/2022  Narrative Patch Wear Time:  7 days and 5 hours (2024-01-16T15:00:05-499 to 2024-01-23T20:03:14-0500)  Atrial Fibrillation occurred continuously (100% burden), ranging from 47-107 bpm (avg of 70 bpm).  Heart rate response was well-controlled atrial fibrillation  There is symptomatic events noted to with brief runs of accelerated idioventricular rhythm  Idioventricular Rhythm was present. Idioventricular Rhythm was detected within +/- 45 seconds of symptomatic patient event(s).  Isolated VEs were rare (<1.0%, 1681), VE Couplets were rare (<1.0%, 232), and VE Triplets were rare (<1.0%, 90). Ventricular Bigeminy was present.            EKG:  EKG is  ordered today.  The ekg ordered today demonstrates atrial fibrillation with competing junctional pacemaker, heart rate 69 bpm.  Recent Labs: 11/02/2022: BUN 9; Creatinine, Ser 0.88; Hemoglobin 12.2; Platelets 375; Potassium 4.1; Sodium 139 02/10/2023: TSH 0.58  Recent Lipid Panel    Component Value Date/Time   CHOL 179 12/28/2021 1120   TRIG 143 12/28/2021 1120   HDL 87 12/28/2021 1120   CHOLHDL 2.1 12/28/2021 1120   LDLCALC 68 12/28/2021 1120     Risk Assessment/Calculations:    CHA2DS2-VASc Score = 4   This indicates a 4.8% annual risk of stroke. The patient's score is based upon: CHF History: 0 HTN History: 1 Diabetes History: 0 Stroke History: 0 Vascular Disease History: 0 Age Score: 2 Gender Score: 1               Physical Exam:    VS:  BP 112/60 (BP Location: Left Arm, Patient Position: Sitting)   Pulse 70   Ht 5\' 6"  (1.676 m)   Wt 154 lb (69.9 kg)   SpO2 96%   BMI 24.86 kg/m     Wt Readings from Last 3  Encounters:  04/03/23 154 lb (69.9 kg)  01/02/23 148 lb (67.1 kg)  12/29/22 150 lb (68 kg)     GEN:  Well nourished, well developed in no acute distress HEENT: Normal NECK: No JVD; No carotid bruits LYMPHATICS: No lymphadenopathy CARDIAC: Irregularly irregular, no murmurs, rubs, gallops RESPIRATORY:  Clear to auscultation without rales, wheezing or rhonchi  ABDOMEN: Soft, non-tender, non-distended MUSCULOSKELETAL: Trace pedal edema; No deformity  SKIN: Warm and dry NEUROLOGIC:  Alert and oriented x 3 PSYCHIATRIC:  Normal affect   ASSESSMENT:    1. Persistent atrial fibrillation (HCC)   2. Secondary hypercoagulable state (HCC)   3. Sick sinus syndrome (HCC)   4. Essential (primary) hypertension   5. Cardiac pacemaker in situ   6. Change in consistency of stool    PLAN:    In order of problems listed above:  Persistent atrial fibrillation/hypercoagulable state- s/p ablation, has decided on rate control strategies. CHA2DS2-VASc score of 4, EKG reveals atrial fibrillation rate controlled 69 bpm today. Continue Eliquis 5 mg twice daily--no indication for dose reduction, continue metoprolol 25 mg daily, continue verapamil--will decrease her current dose to 90 mg once daily--see below.  CBC and BMET checked by her PCP in May, will request results, patient states they were normal. Hypertension-blood pressure today is 112/60 in the office, blood pressure at home is ranging 82 to 104 systolic.  Will decrease her verapamil to 90 mg daily. Sick sinus syndrome s/p PPM 2020-recent device check was normal, followed by EP.  Change is consistency of stool - stated her GI MD retired, concerned about the shape of her stool, reports it is thinner than normal and less frequent. Denies bleeding, unintentional weight loss. Ambulatory referral to GI.   Disposition - Decrease verapamil to 90 mg daily. Keep f/u appt with Dr. Dulce Sellar. Ambulatory referral to GI.            Medication Adjustments/Labs  and Tests Ordered: Current medicines are reviewed at length with the patient today.  Concerns regarding medicines are outlined above.  Orders Placed This Encounter  Procedures   Ambulatory referral to Gastroenterology   EKG 12-Lead   Meds ordered this encounter  Medications   verapamil (CALAN-SR) 180 MG CR tablet    Sig: Take 0.5 tablets (90 mg total) by mouth at bedtime.    Dispense:  45 tablet    Refill:  3    Patient Instructions  Medication Instructions:  Your physician has recommended you  make the following change in your medication:  Decrease Verapamil to 90 mg once daily, 1/2 of the 180  *If you need a refill on your cardiac medications before your next appointment, please call your pharmacy*   Lab Work: NONE If you have labs (blood work) drawn today and your tests are completely normal, you will receive your results only by: MyChart Message (if you have MyChart) OR A paper copy in the mail If you have any lab test that is abnormal or we need to change your treatment, we will call you to review the results.   Testing/Procedures: NONE   Follow-Up: At [facility], you and your health needs are our priority.  As part of our continuing mission to provide you with exceptional heart care, we have created designated Provider Care Teams.  These Care Teams include your primary Cardiologist (physician) and Advanced Practice Providers (APPs -  Physician Assistants and Nurse Practitioners) who all work together to provide you with the care you need, when you need it.  We recommend signing up for the patient portal called "MyChart".  Sign up information is provided on this After Visit Summary.  MyChart is used to connect with patients for Virtual Visits (Telemedicine).  Patients are able to view lab/test results, encounter notes, upcoming appointments, etc.  Non-urgent messages can be sent to your provider as well.   To learn more about what you can do with MyChart, go to  [email or web address].    Your next appointment:  Keep appointment with Dr. Dulce Sellar    Provider:   Norman Herrlich, MD    Other Instructions Referred to Dr. Marcial Pacas [place]:  [address] [place], [place] [address] Hours:  Open ? Closes 5:15?PM Phone: [phone]   Signed, Flossie Dibble, NP  04/03/2023 3:27 PM    [facility]

## 2023-04-26 ENCOUNTER — Ambulatory Visit (INDEPENDENT_AMBULATORY_CARE_PROVIDER_SITE_OTHER): Payer: Medicare Other | Admitting: Endocrinology

## 2023-04-26 VITALS — BP 110/60 | HR 92 | Ht 66.0 in | Wt 153.2 lb

## 2023-04-26 DIAGNOSIS — E210 Primary hyperparathyroidism: Secondary | ICD-10-CM

## 2023-04-26 DIAGNOSIS — M85859 Other specified disorders of bone density and structure, unspecified thigh: Secondary | ICD-10-CM | POA: Diagnosis not present

## 2023-04-26 DIAGNOSIS — Z780 Asymptomatic menopausal state: Secondary | ICD-10-CM

## 2023-04-26 DIAGNOSIS — E890 Postprocedural hypothyroidism: Secondary | ICD-10-CM | POA: Diagnosis not present

## 2023-04-26 LAB — BASIC METABOLIC PANEL
BUN: 18 mg/dL (ref 6–23)
CO2: 29 mEq/L (ref 19–32)
Calcium: 10.6 mg/dL — ABNORMAL HIGH (ref 8.4–10.5)
Chloride: 101 mEq/L (ref 96–112)
Creatinine, Ser: 0.93 mg/dL (ref 0.40–1.20)
GFR: 58.64 mL/min — ABNORMAL LOW (ref >60.00)
Glucose, Bld: 94 mg/dL (ref 70–99)
Potassium: 4.7 mEq/L (ref 3.5–5.1)
Sodium: 137 mEq/L (ref 135–145)

## 2023-04-26 LAB — T4, FREE: Free T4: 1.34 ng/dL (ref 0.60–1.60)

## 2023-04-26 LAB — TSH: TSH: 1.56 u[IU]/mL (ref 0.35–5.50)

## 2023-04-26 NOTE — Progress Notes (Signed)
Patient ID: April Padilla, female   DOB: 04-Dec-1945, 77 y.o.   MRN: [id number]               Reason for Appointment: Endocrinology follow-up visit    History of Present Illness:   Problem 1:  Hypothyroidism was first diagnosed in the 1990s after treatment of hyperthyroidism with radioactive iodine Details are not available She thinks that for several years she had been stable on 88 g of levothyroxine  In 07/2017 she had a routine TSH test and this was found to be high at 7.9 Subsequently her levothyroxine doses have been variable between 112 and 125 mcg  She tends to have chronic symptoms of fatigue unrelated to the hypothyroidism At times may have some palpitations with her cardiac arrhythmias and occasionally tremor also  In 6/22 when she was on 100 mcg levothyroxine her TSH was high normal at 3.8  She was changed to 112 mcg levothyroxine in 2/24 since TSH was low Subsequently TSH was normal Although she is supposed to be taking 1 tablet daily she thinks she is only taking 6-1/2 tablets/week now  She is regular with taking levothyroxine in the morning an hour before breakfast with water only  Labs be checked today         Patient's weight history is as follows:  Wt Readings from Last 3 Encounters:  04/26/23 153 lb 3.2 oz (69.5 kg)  04/03/23 154 lb (69.9 kg)  01/02/23 148 lb (67.1 kg)    Thyroid function results have been as follows:  Lab Results  Component Value Date   TSH 0.58 02/10/2023   TSH 0.12 (L) 12/29/2022   TSH 15.51 (H) 09/29/2022   TSH 1.13 03/29/2022   FREET4 1.28 02/10/2023   FREET4 1.45 12/29/2022   FREET4 1.20 09/29/2022   FREET4 1.56 03/29/2022   T3FREE 2.5 09/16/2020    HYPERCALCEMIA: See review of systems    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   Chest discomfort 12/23/2018   Chronic atrial fibrillation (HCC) 01/26/2016   Last Assessment & Plan:  Has had  ablation through [facility] , her BP here was stable , discussed her current meds and the S/E , she is off of the lasix now and will see her CMP for her as well as CK today for her muscle cramps she has had long before her ablation with possible pain from the statin   Chronic fatigue 07/25/2016   Chronic idiopathic constipation 01/26/2016   Dysrhythmia    on toprol for fast heart beat   Encounter for care of pacemaker 12/19/2018   Essential (primary) hypertension 01/26/2016   Family history of colon cancer in mother 07/17/2017   GERD (gastroesophageal reflux disease) 07/17/2017   H/O hiatal hernia    High risk medication use 01/26/2016   History of adenomatous polyp of colon 07/17/2017   Hypercalcemia 01/26/2016   Hypothyroidism    pt. states she took radioactive pill and has no thyroid   Irregular heart rate 07/06/2018   Left shoulder pain 07/06/2018   Long term current use of anticoagulant 07/06/2018   Mixed hyperlipidemia 01/26/2016   Myalgia 05/18/2016   Last Assessment & Plan:  Formatting of this note might be different from the original. 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   Presence of left artificial knee joint 01/22/2015   Primary generalized (osteo)arthritis 01/26/2016   Primary hyperparathyroidism (HCC) 11/26/2017   Primary osteoarthritis involving multiple joints 01/26/2016   PSVT (paroxysmal supraventricular tachycardia) 01/26/2016   Recurrent urticaria 01/26/2016   S/P TKR (total knee replacement) 12/27/2012   Shortness of breath    with exertion   Sinobronchitis 09/07/2018   Situational anxiety 09/07/2018   Thyroid function test abnormal 08/19/2016   Vitamin D deficiency 11/26/2017    Past Surgical History:  Procedure  Laterality Date   BUNIONECTOMY  yrs. ago   left foot   FOOT SURGERY  2009   took knot out  and put in screws   KNEE ARTHROSCOPY W/ MENISCAL REPAIR  2009   left   TOTAL KNEE ARTHROPLASTY  01/16/2012   Procedure: TOTAL KNEE ARTHROPLASTY;  Surgeon: Raymon Mutton, MD;  Location: [facility] OR;  Service: Orthopedics;  Laterality: Left;    Family History  Problem Relation Age of Onset   Cancer Mother    Thyroid disease Mother    Stroke Father    Anesthesia problems Neg Hx    Hypotension Neg Hx    Malignant hyperthermia Neg Hx    Pseudochol deficiency Neg Hx    Sleep apnea Neg Hx     Social History:  reports that she has never smoked. She has never used smokeless tobacco. She reports that she does not drink alcohol and does not use drugs.  Allergies:  Allergies  Allergen Reactions   Clindamycin/Lincomycin Rash   Nitrofurantoin Monohyd Macro Rash   Silver Sulfadiazine Rash    Allergies as of 04/26/2023       Reactions   Clindamycin/lincomycin Rash   Nitrofurantoin Monohyd Macro Rash   Silver Sulfadiazine Rash        Medication List        Accurate as of April 26, 2023 11:08 AM. If you have any questions, ask your nurse or doctor.          ALPRAZolam 0.5 MG tablet Commonly known as: XANAX Take 0.5 mg by mouth daily as needed for anxiety or sleep.   apixaban 5 MG Tabs tablet Commonly known as: ELIQUIS Take 5 mg by mouth 2 (two) times daily.   cholecalciferol 25 MCG (1000 UNIT) tablet Commonly known as: VITAMIN D3 Take 2,000 Units by mouth daily.   famotidine 20 MG tablet Commonly known as: PEPCID Take 1 tablet by mouth in the morning and at bedtime.   furosemide 20 MG tablet Commonly known as: LASIX Take 1 tablet (20 mg total) by mouth as needed for fluid or edema.   levothyroxine 112 MCG tablet Commonly known as: SYNTHROID TAKE 1 TABLET BY MOUTH DAILY BEFORE BREAKFAST. What changed: additional instructions   magnesium oxide 400 MG tablet Commonly known as:  MAG-OX Take 400 mg by mouth daily.   metoprolol succinate 25 MG 24 hr tablet Commonly known as: Toprol XL Take 1 tablet (25 mg total) by mouth daily. What changed: additional instructions   rosuvastatin 5 MG tablet Commonly known as: CRESTOR Take 5 mg by mouth daily.   verapamil 180 MG CR tablet Commonly known as: CALAN-SR Take 0.5 tablets (90 mg total) by mouth at bedtime.           Review of Systems  HYPERCALCEMIA: She has mild hyperparathyroidism with variable increase in calcium levels Previously had  increased PTH level  also  Highest calcium has been 10.8 previously Subsequently appears to be upper normal consistently   Lab Results  Component Value Date   PTH 76 (H) 11/24/2017   CALCIUM 10.3 11/02/2022    OSTEOPENIA: In 2019 she was found to have osteopenia as follows on routine screening  Bone density in 08/2020 is stable to improved, has not had any further follow-up    Lumbar spine L1-L4 Femoral neck (FN) 33% distal radius  T-score   -1.3 RFN: -1.6 LFN: -1.8 -2.5  Change in BMD from previous DXA test (%) Up 3.5%* Up 2.0% Down 0.5%  (*) statistically significant  VITAMIN D deficiency:  She was found to have a low vitamin D level of 15 in 10/2017  She is taking OTC vitamin D3, 2000 units daily, followed by PCP   Lab Results  Component Value Date   VD25OH 31.60 07/14/2020   VD25OH 37.79 06/17/2019   VD25OH 33.12 05/31/2018               Examination:    BP 110/60 (BP Location: Left Arm, Patient Position: Sitting, Cuff Size: Normal)   Pulse 92   Ht 5\' 6"  (1.676 m)   Wt 153 lb 3.2 oz (69.5 kg)   SpO2 96%   BMI 24.73 kg/m     Assessment:  HYPOTHYROIDISM, secondary to I-131 ablation of thyroid for hyperthyroidism  She has had somewhat variable thyroid levels   She is taking 112 mcg levothyroxine now, 6-1/2 tablets a week No new symptoms subjectively although she still has some palpitations for other reasons and tends to have fatigue  chronically She takes her liver thyroxine consistently well before breakfast daily  OSTEOPENIA: This is likely associated with menopausal status but also mild hyperparathyroidism No recent calcium levels available   PLAN:   TSH to be checked today Her dose will be adjusted if needed otherwise she will come back in 6 months  Calcium will be checked today  Bone density to be ordered from [facility]  There are no Patient Instructions on file for this visit.    Reather Littler 04/26/2023, 11:08 AM     Note: This office note was prepared with Dragon voice recognition system technology. Any transcriptional errors that result from this process are unintentional.  Addendum: TSH 15, she will now go to 125 mcg levothyroxine

## 2023-04-27 NOTE — Progress Notes (Signed)
Please call: Calcium slightly higher than normal, continue to monitor.  She should have been scheduled for bone density already at [facility].  Thyroid is okay and continue the same dose

## 2023-05-10 LAB — CUP PACEART REMOTE DEVICE CHECK
Battery Remaining Longevity: 96 mo
Battery Voltage: 2.99 V
Brady Statistic AS VP Percent: 0.34 %
Brady Statistic AS VS Percent: 0.05 %
Brady Statistic RV Percent Paced: 0.79 %
Date Time Interrogation Session: 20240708220713
Implantable Pulse Generator Implant Date: 20200210
Lead Channel Impedance Value: 620 Ohm
Lead Channel Pacing Threshold Amplitude: 0.625 V
Lead Channel Pacing Threshold Pulse Width: 0.24 ms
Lead Channel Sensing Intrinsic Amplitude: 22.95 mV
Lead Channel Setting Pacing Amplitude: 1.5 V
Lead Channel Setting Pacing Pulse Width: 0.24 ms
Lead Channel Setting Sensing Sensitivity: 2 mV

## 2023-05-11 ENCOUNTER — Ambulatory Visit (INDEPENDENT_AMBULATORY_CARE_PROVIDER_SITE_OTHER): Payer: Medicare Other

## 2023-05-11 DIAGNOSIS — I495 Sick sinus syndrome: Secondary | ICD-10-CM

## 2023-05-29 NOTE — Progress Notes (Unsigned)
Cardiology Office Note:    Date:  05/30/2023   ID:  April, Padilla 1946-01-18, MRN [id number]  PCP:  Hadley Pen, MD  Cardiologist:  Norman Herrlich, MD    Referring MD: Hadley Pen, MD    ASSESSMENT:    1. Hypertensive heart disease with chronic diastolic congestive heart failure (HCC)   2. Permanent atrial fibrillation (HCC)   3. Secondary hypercoagulable state (HCC)   4. Sick sinus syndrome (HCC)   5. Cardiac pacemaker in situ    PLAN:    In order of problems listed above:  She will reduce verapamil to 80 mg once daily take in the evening taking beta-blocker in the morning for effective rate control over this point forward all blood pressures will be done in the right arm.  She will continue to record and trend bring to the office Continue her current anticoagulant I encouraged her to discuss with endocrinology hypercalcemia that may be affecting fatigue and constipation   Next appointment: 6 months   Medication Adjustments/Labs and Tests Ordered: Current medicines are reviewed at length with the patient today.  Concerns regarding medicines are outlined above.  No orders of the defined types were placed in this encounter.  No orders of the defined types were placed in this encounter.    History of Present Illness:    April Padilla is a 77 y.o. female with a hx of chronic atrial fibrillation with long-term anticoagulation Eliquis sick sinus syndrome permanent pacemaker hypertension hyperlipidemia hyperparathyroidism and hypothyroidism last seen 04/03/2023.  She had initial cryoablation 2016 with Dr. Chales Abrahams for atrial fibrillation and subsequent repeat EP intervention 2017 afterwards developed atrial tachycardia and was placed on flecainide and permanent pacemaker in 2020 for symptomatic bradycardia.  Most recently has been in chronic atrial fibrillation and opted for rate control strategy.  There was concern of low blood pressure readings at the time of  her last visit and her verapamil was decreased..  Compliance with diet, lifestyle and medications: Yes  After the visit she never decreased her dose of calcium channel blocker She has a validated device good technique in her home blood pressures in the left arm are running mostly 90-100 systolic I checked blood pressure both arms and she has about a 20 mm 128/60 in the right arm 108/60 in the left arm Should do all blood pressures in her right arm from I will decrease the dose of her calcium channel blocker. She is concerned about fatigue constipation she has hypercalcemia last 10.6 and she sees an endocrinologist for thyroid She has varicose veins that her legs are heavy Unfortunately she just does not feel well and chronic atrial fibrillation No edema orthopnea chest pain syncope  Recent labs 03/10/2023 GFR 54 cc creatinine 1.06 sodium 137 potassium 4.3 last hemoglobin 11.1 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   Chest discomfort 12/23/2018   Chronic atrial fibrillation (HCC) 01/26/2016   Last Assessment & Plan:  Has had ablation through [facility] , her BP here was stable , discussed her current meds and the S/E , she is off of the lasix now and will see her CMP for her as well as CK today for her muscle cramps she has had long before her ablation with possible pain from the statin   Chronic fatigue 07/25/2016   Chronic idiopathic constipation 01/26/2016   Dysrhythmia    on toprol for fast heart beat   Encounter  for care of pacemaker 12/19/2018   Essential (primary) hypertension 01/26/2016   Family history of colon cancer in mother 07/17/2017   GERD (gastroesophageal reflux disease) 07/17/2017   H/O hiatal hernia    High risk medication use 01/26/2016   History of adenomatous polyp of colon 07/17/2017   Hypercalcemia 01/26/2016   Hypothyroidism    pt. states she took radioactive pill and has no thyroid   Irregular heart  rate 07/06/2018   Left shoulder pain 07/06/2018   Long term current use of anticoagulant 07/06/2018   Mixed hyperlipidemia 01/26/2016   Myalgia 05/18/2016   Last Assessment & Plan:  Formatting of this note might be different from the original. 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   Presence of left artificial knee joint 01/22/2015   Primary generalized (osteo)arthritis 01/26/2016   Primary hyperparathyroidism (HCC) 11/26/2017   Primary osteoarthritis involving multiple joints 01/26/2016   PSVT (paroxysmal supraventricular tachycardia) 01/26/2016   Recurrent urticaria 01/26/2016   S/P TKR (total knee replacement) 12/27/2012   Shortness of breath    with exertion   Sinobronchitis 09/07/2018   Situational anxiety 09/07/2018   Thyroid function test abnormal 08/19/2016   Vitamin D deficiency 11/26/2017    Current Medications: Current Meds  Medication Sig   ALPRAZolam (XANAX) 0.5 MG tablet Take 0.5 mg by mouth daily as needed for anxiety or sleep.   apixaban (ELIQUIS) 5 MG TABS tablet Take 5 mg by mouth 2 (two) times daily.    cholecalciferol (VITAMIN D3) 25 MCG (1000 UNIT) tablet Take 2,000 Units by mouth daily.   famotidine (PEPCID) 20 MG tablet Take 1 tablet by mouth in the morning and at bedtime.   furosemide (LASIX) 20 MG tablet Take 1 tablet (20 mg total) by mouth as needed for fluid or edema.   levothyroxine (SYNTHROID) 112 MCG tablet TAKE 1 TABLET BY MOUTH DAILY BEFORE BREAKFAST. (Patient taking differently: Take 112 mcg by mouth daily before breakfast. Patient taking 1/2 of 112 mcg on Sundays)   magnesium oxide (MAG-OX) 400 MG tablet Take 400 mg by mouth daily.   metoprolol succinate (TOPROL XL) 25 MG 24 hr tablet Take 1 tablet  (25 mg total) by mouth daily. (Patient taking differently: Take 25 mg by mouth daily. Pt takes at bedtime)   rosuvastatin (CRESTOR) 5 MG tablet Take 5 mg by mouth daily.   verapamil (VERELAN) 180 MG 24 hr capsule Take 90 mg by mouth 2 (two) times daily.      EKGs/Labs/Other Studies Reviewed:    The following studies were reviewed today:  Cardiac Studies & Procedures       ECHOCARDIOGRAM  ECHOCARDIOGRAM COMPLETE 11/11/2020  Narrative ECHOCARDIOGRAM REPORT    Patient Name:   April Padilla Date of Exam: 11/11/2020 Medical Rec #:  [id number]        Height:       65.0 in Accession #:    [id number]       Weight:       16 4.4 lb Date of Birth:  12/23/1945        BSA:          1.820 m Patient Age:    16 years  BP:           129/74 mmHg Patient Gender: F                HR:           91 bpm. Exam Location:  [address]  Procedure: 2D Echo, Cardiac Doppler and Color Doppler  Indications:    I11.9  History:        Patient has no prior history of Echocardiogram examinations. 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  Sonographer:    Samule Ohm RDCS Referring Phys: [id number] Raeana Blinn J Mykaila Blunck  IMPRESSIONS   1. Left ventricular ejection fraction, by estimation, is 65 to 70%. The left ventricle has normal function. The left ventricle has no regional wall motion abnormalities. There is mild concentric left ventricular hypertrophy. Diastolic function indeterminant due to atrial fibrillation. 2. Right ventricular systolic function is normal. The right ventricular size is moderately enlarged. There is mildly elevated pulmonary artery systolic pressure. The estimated right ventricular systolic pressure is 41.4 mmHg. 3. Left atrial size was moderately dilated. 4. Right atrial size was severely dilated. 5. The mitral valve is normal in structure. Mild mitral valve regurgitation. 6. The aortic  valve is tricuspid. There is mild thickening of the aortic valve. Aortic valve regurgitation is not visualized. No aortic stenosis is present. 7. The inferior vena cava is normal in size with <50% respiratory variability, suggesting right atrial pressure of 8 mmHg.  Comparison(s): No prior Echocardiogram.  FINDINGS Left Ventricle: Left ventricular ejection fraction, by estimation, is 65 to 70%. The left ventricle has normal function. The left ventricle has no regional wall motion abnormalities. The left ventricular internal cavity size was normal in size. There is mild concentric left ventricular hypertrophy. Diastolic function indeterminant due to atrial fibrillation.  Right Ventricle: The right ventricular size is moderately enlarged. Right vetricular wall thickness was not well visualized. Right ventricular systolic function is normal. There is mildly elevated pulmonary artery systolic pressure. The tricuspid regurgitant velocity is 2.89 m/s, and with an assumed right atrial pressure of 8 mmHg, the estimated right ventricular systolic pressure is 41.4 mmHg.  Left Atrium: Left atrial size was moderately dilated.  Right Atrium: Right atrial size was severely dilated.  Pericardium: There is no evidence of pericardial effusion.  Mitral Valve: The mitral valve is normal in structure. Mild mitral valve regurgitation.  Tricuspid Valve: The tricuspid valve is normal in structure. Tricuspid valve regurgitation is mild.  Aortic Valve: The aortic valve is tricuspid. There is mild thickening of the aortic valve. Aortic valve regurgitation is not visualized. No aortic stenosis is present.  Pulmonic Valve: The pulmonic valve was normal in structure. Pulmonic valve regurgitation is mild.  Aorta: The aortic root and ascending aorta are structurally normal, with no evidence of dilitation.  Venous: The inferior vena cava is normal in size with less than 50% respiratory variability, suggesting right  atrial pressure of 8 mmHg.  IAS/Shunts: No atrial level shunt detected by color flow Doppler.  Additional Comments: A pacer wire is visualized.   LEFT VENTRICLE PLAX 2D LVIDd:         4.20 cm  Diastology LVIDs:         2.10 cm  LV e' medial:    7.18 cm/s LV PW:         0.90 cm  LV E/e' medial:  18.2 LV IVS:        1.20 cm  LV e' lateral:  10.87 cm/s LVOT diam:     1.70 cm  LV E/e' lateral: 12.0 LV SV:         40 LV SV Index:   22 LVOT Area:     2.27 cm   RIGHT VENTRICLE             IVC RV S prime:     10.78 cm/s  IVC diam: 1.60 cm TAPSE (M-mode): 1.8 cm RVSP:           36.4 mmHg  LEFT ATRIUM             Index       RIGHT ATRIUM           Index LA diam:        4.60 cm 2.53 cm/m  RA Pressure: 3.00 mmHg LA Vol (A2C):   84.8 ml 46.59 ml/m RA Area:     19.50 cm LA Vol (A4C):   57.0 ml 31.32 ml/m RA Volume:   57.30 ml  31.48 ml/m LA Biplane Vol: 71.7 ml 39.40 ml/m AORTIC VALVE LVOT Vmax:   90.18 cm/s LVOT Vmean:  58.380 cm/s LVOT VTI:    0.178 m  AORTA Ao Root diam: 3.00 cm Ao Asc diam:  2.90 cm  MV E velocity: 130.40 cm/s  TRICUSPID VALVE TR Peak grad:   33.4 mmHg TR Vmax:        289.00 cm/s Estimated RAP:  3.00 mmHg RVSP:           36.4 mmHg  SHUNTS Systemic VTI:  0.18 m Systemic Diam: 1.70 cm  Laurance Flatten MD Electronically signed by Laurance Flatten MD Signature Date/Time: 11/11/2020/2:46:36 PM    Final    MONITORS  LONG TERM MONITOR (3-14 DAYS) 11/29/2022  Narrative Patch Wear Time:  7 days and 5 hours (2024-01-16T15:00:05-499 to 2024-01-23T20:03:14-0500)  Atrial Fibrillation occurred continuously (100% burden), ranging from 47-107 bpm (avg of 70 bpm).  Heart rate response was well-controlled atrial fibrillation  There is symptomatic events noted to with brief runs of accelerated idioventricular rhythm  Idioventricular Rhythm was present. Idioventricular Rhythm was detected within +/- 45 seconds of symptomatic patient  event(s).  Isolated VEs were rare (<1.0%, 1681), VE Couplets were rare (<1.0%, 232), and VE Triplets were rare (<1.0%, 90). Ventricular Bigeminy was present.               Recent Labs: 11/02/2022: Hemoglobin 12.2; Platelets 375 04/26/2023: BUN 18; Creatinine, Ser 0.93; Potassium 4.7; Sodium 137; TSH 1.56  Recent Lipid Panel    Component Value Date/Time   CHOL 179 12/28/2021 1120   TRIG 143 12/28/2021 1120   HDL 87 12/28/2021 1120   CHOLHDL 2.1 12/28/2021 1120   LDLCALC 68 12/28/2021 1120    Physical Exam:    VS:  BP 110/62 (BP Location: Left Arm, Patient Position: Sitting, Cuff Size: Normal)   Pulse 86   Ht 5\' 6"  (1.676 m)   Wt 155 lb (70.3 kg)   SpO2 93%   BMI 25.02 kg/m     Wt Readings from Last 3 Encounters:  05/30/23 155 lb (70.3 kg)  04/26/23 153 lb 3.2 oz (69.5 kg)  04/03/23 154 lb (69.9 kg)     GEN:  Well nourished, well developed in no acute distress HEENT: Normal NECK: No JVD; No carotid bruits LYMPHATICS: No lymphadenopathy CARDIAC: Irregular rate and rhythm variable first heart sound  RESPIRATORY:  Clear to auscultation without rales, wheezing or rhonchi  ABDOMEN: Soft, non-tender, non-distended MUSCULOSKELETAL:  No edema;  No deformity  SKIN: Warm and dry NEUROLOGIC:  Alert and oriented x 3 PSYCHIATRIC:  Normal affect    Signed, Norman Herrlich, MD  05/30/2023 4:20 PM    [facility]

## 2023-05-30 ENCOUNTER — Ambulatory Visit: Payer: Medicare Other | Attending: Cardiology | Admitting: Cardiology

## 2023-05-30 ENCOUNTER — Encounter: Payer: Self-pay | Admitting: Cardiology

## 2023-05-30 VITALS — BP 110/62 | HR 86 | Ht 66.0 in | Wt 155.0 lb

## 2023-05-30 DIAGNOSIS — I495 Sick sinus syndrome: Secondary | ICD-10-CM | POA: Insufficient documentation

## 2023-05-30 DIAGNOSIS — I4821 Permanent atrial fibrillation: Secondary | ICD-10-CM | POA: Diagnosis not present

## 2023-05-30 DIAGNOSIS — I110 Hypertensive heart disease with heart failure: Secondary | ICD-10-CM | POA: Diagnosis not present

## 2023-05-30 DIAGNOSIS — D6869 Other thrombophilia: Secondary | ICD-10-CM | POA: Diagnosis not present

## 2023-05-30 DIAGNOSIS — I5032 Chronic diastolic (congestive) heart failure: Secondary | ICD-10-CM | POA: Diagnosis present

## 2023-05-30 DIAGNOSIS — Z950 Presence of cardiac pacemaker: Secondary | ICD-10-CM | POA: Insufficient documentation

## 2023-05-30 MED ORDER — VERAPAMIL HCL ER 180 MG PO TBCR: 90.0000 mg | EXTENDED_RELEASE_TABLET | Freq: Every day | ORAL | 3 refills | Status: DC

## 2023-05-30 NOTE — Progress Notes (Signed)
Remote pacemaker transmission.   

## 2023-05-30 NOTE — Patient Instructions (Addendum)
Medication Instructions:  Your physician has recommended you make the following change in your medication:   START: Verapamil 90 mg daily  *If you need a refill on your cardiac medications before your next appointment, please call your pharmacy*   Lab Work: None If you have labs (blood work) drawn today and your tests are completely normal, you will receive your results only by: MyChart Message (if you have MyChart) OR A paper copy in the mail If you have any lab test that is abnormal or we need to change your treatment, we will call you to review the results.   Testing/Procedures: None   Follow-Up: At [facility], you and your health needs are our priority.  As part of our continuing mission to provide you with exceptional heart care, we have created designated Provider Care Teams.  These Care Teams include your primary Cardiologist (physician) and Advanced Practice Providers (APPs -  Physician Assistants and Nurse Practitioners) who all work together to provide you with the care you need, when you need it.  We recommend signing up for the patient portal called "MyChart".  Sign up information is provided on this After Visit Summary.  MyChart is used to connect with patients for Virtual Visits (Telemedicine).  Patients are able to view lab/test results, encounter notes, upcoming appointments, etc.  Non-urgent messages can be sent to your provider as well.   To learn more about what you can do with MyChart, go to [email or web address].    Your next appointment:   6 month(s)  Provider:   Norman Herrlich, MD    Other Instructions None     [facility]  Tips to measure your blood pressure correctly  To determine whether you have hypertension, a medical professional will take a blood pressure reading. How you prepare for the test, the position of your arm, and other factors can change a blood pressure reading by 10% or more. That could be enough to hide high blood pressure,  start you on a drug you don't really need, or lead your doctor to incorrectly adjust your medications. National and international guidelines offer specific instructions for measuring blood pressure. If a doctor, nurse, or medical assistant isn't doing it right, don't hesitate to ask him or her to get with the guidelines. Here's what you can do to ensure a correct reading:  Don't drink a caffeinated beverage or smoke during the 30 minutes before the test.  Sit quietly for five minutes before the test begins.  During the measurement, sit in a chair with your feet on the floor and your arm supported so your elbow is at about heart level.  The inflatable part of the cuff should completely cover at least 80% of your upper arm, and the cuff should be placed on bare skin, not over a shirt.  Don't talk during the measurement.  Have your blood pressure measured twice, with a brief break in between. If the readings are different by 5 points or more, have it done a third time. There are times to break these rules. If you sometimes feel lightheaded when getting out of bed in the morning or when you stand after sitting, you should have your blood pressure checked while seated and then while standing to see if it falls from one position to the next. Because blood pressure varies throughout the day, your doctor will rarely diagnose hypertension on the basis of a single reading. Instead, he or she will want to confirm the measurements on at least  two occasions, usually within a few weeks of one another. The exception to this rule is if you have a blood pressure reading of 180/110 mm Hg or higher. A result this high usually calls for prompt treatment. It's also a good idea to have your blood pressure measured in both arms at least once, since the reading in one arm (usually the right) may be higher than that in the left. A 2014 study in The American Journal of Medicine of nearly 3,400 people found average arm- to-arm  differences in systolic blood pressure of about 5 points. The higher number should be used to make treatment decisions. In 2017, new guidelines from the American Heart Association, the [employer] of Cardiology, and nine other health organizations lowered the diagnosis of high blood pressure to 130/80 mm Hg or higher for all adults. The guidelines also redefined the various blood pressure categories to now include normal, elevated, Stage 1 hypertension, Stage 2 hypertension, and hypertensive crisis (see "Blood pressure categories"). Blood pressure categories  Blood pressure category SYSTOLIC (upper number)  DIASTOLIC (lower number)  Normal Less than 120 mm Hg and Less than 80 mm Hg  Elevated 120-129 mm Hg and Less than 80 mm Hg  High blood pressure: Stage 1 hypertension 130-139 mm Hg or 80-89 mm Hg  High blood pressure: Stage 2 hypertension 140 mm Hg or higher or 90 mm Hg or higher  Hypertensive crisis (consult your doctor immediately) Higher than 180 mm Hg and/or Higher than 120 mm Hg  Source: American Heart Association and American Stroke Association. For more on getting your blood pressure under control, buy Controlling Your Blood Pressure, a Special Health Report from [facility].

## 2023-06-28 ENCOUNTER — Encounter: Payer: Self-pay | Admitting: Internal Medicine

## 2023-06-28 ENCOUNTER — Ambulatory Visit: Payer: Medicare Other | Attending: Internal Medicine | Admitting: Internal Medicine

## 2023-06-28 VITALS — BP 104/60 | HR 96 | Ht 66.0 in | Wt 153.0 lb

## 2023-06-28 DIAGNOSIS — I495 Sick sinus syndrome: Secondary | ICD-10-CM | POA: Diagnosis present

## 2023-06-28 LAB — CUP PACEART INCLINIC DEVICE CHECK
Date Time Interrogation Session: 20240828121111
Implantable Pulse Generator Implant Date: 20200210

## 2023-06-28 NOTE — Progress Notes (Signed)
HPI April Padilla returns for ongoing evaluation and managemen of atrial fib. She is a pleasant 77 yo woman with a long h/o atrial fib. She has undergone 2 catheter ablations and has in the past been treated with dofetilide. She appears to have initially undergone cryoablation in 2016, and then was treated with flecainide, propafenone, Multaq and popafenone. She had repeat PVI at [facility] in 2017. She developed symptomatic bradycardia and underwent Micra insertion. She has continued to have atrial fib. She notes dyspnea with exertion. She also notes some fatigue.  She has been offered amiodarone in the past but has refused this medication. She is currently being treated with a strategy of rate control with both metoprolol and verapamil. She has minimal palpitations but she gets tired quickly. She feels palpitations and her VR is not well controlled based on her log of HR's and BP's.  Allergies  Allergen Reactions   Clindamycin/Lincomycin Rash   Nitrofurantoin Monohyd Macro Rash   Silver Sulfadiazine Rash     Current Outpatient Medications  Medication Sig Dispense Refill   ALPRAZolam (XANAX) 0.5 MG tablet Take 0.5 mg by mouth daily as needed for anxiety or sleep.     apixaban (ELIQUIS) 5 MG TABS tablet Take 5 mg by mouth 2 (two) times daily.      cholecalciferol (VITAMIN D3) 25 MCG (1000 UNIT) tablet Take 2,000 Units by mouth daily.     famotidine (PEPCID) 20 MG tablet Take 1 tablet by mouth in the morning and at bedtime.     furosemide (LASIX) 20 MG tablet Take 1 tablet (20 mg total) by mouth as needed for fluid or edema. 90 tablet 3   levothyroxine (SYNTHROID) 112 MCG tablet TAKE 1 TABLET BY MOUTH DAILY BEFORE BREAKFAST. (Patient taking differently: Take 112 mcg by mouth daily before breakfast. Patient taking 1/2 of 112 mcg on Sundays) 90 tablet 1   magnesium oxide (MAG-OX) 400 MG tablet Take 400 mg by mouth daily.     metoprolol succinate (TOPROL XL) 25 MG 24 hr tablet Take 1 tablet (25 mg  total) by mouth daily. (Patient taking differently: Take 25 mg by mouth daily. Pt takes at bedtime) 90 tablet 3   rosuvastatin (CRESTOR) 5 MG tablet Take 5 mg by mouth daily.     verapamil (CALAN-SR) 180 MG CR tablet Take 0.5 tablets (90 mg total) by mouth daily. 45 tablet 3   No current facility-administered medications for this visit.     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   Chest discomfort 12/23/2018   Chronic atrial fibrillation (HCC) 01/26/2016   Last Assessment & Plan:  Has had ablation through [facility] , her BP here was stable , discussed her current meds and the S/E , she is off of the lasix now and will see her CMP for her as well as CK today for her muscle cramps she has had long before her ablation with possible pain from the statin   Chronic fatigue 07/25/2016   Chronic idiopathic constipation 01/26/2016   Dysrhythmia    on toprol for fast heart beat   Encounter for care of pacemaker 12/19/2018   Essential (primary) hypertension 01/26/2016   Family history of colon cancer in mother 07/17/2017   GERD (gastroesophageal reflux disease) 07/17/2017   H/O hiatal hernia    High risk medication use 01/26/2016   History of adenomatous polyp of colon 07/17/2017   Hypercalcemia 01/26/2016  Hypothyroidism    pt. states she took radioactive pill and has no thyroid   Irregular heart rate 07/06/2018   Left shoulder pain 07/06/2018   Long term current use of anticoagulant 07/06/2018   Mixed hyperlipidemia 01/26/2016   Myalgia 05/18/2016   Last Assessment & Plan:  Formatting of this note might be different from the original. 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   Past Surgical History:  Procedure Laterality Date   BUNIONECTOMY  yrs. ago   left foot   FOOT SURGERY  2009   took knot out  and put in screws   KNEE ARTHROSCOPY W/ MENISCAL REPAIR  2009   left   TOTAL KNEE ARTHROPLASTY  01/16/2012   Procedure: TOTAL KNEE ARTHROPLASTY;  Surgeon: Raymon Mutton, MD;  Location: [facility] OR;  Service: Orthopedics;  Laterality: Left;     Family History  Problem Relation Age of Onset   Cancer Mother    Thyroid disease Mother    Stroke Father    Anesthesia problems Neg Hx    Hypotension Neg Hx    Malignant hyperthermia Neg Hx    Pseudochol deficiency Neg Hx    Sleep apnea Neg Hx      Social History   Socioeconomic History   Marital status: Married    Spouse name: Not on file   Number of children: Not on file   Years of education: Not on file   Highest education level: Not on file  Occupational History   Not on file  Tobacco Use   Smoking status: Never   Smokeless tobacco: Never  Substance and Sexual Activity   Alcohol use: No    Comment: occasional   Drug use: No   Sexual activity: Yes  Other Topics Concern   Not on file  Social History Narrative   Not on file   Social Determinants of Health   Financial Resource Strain: Not on file  Food Insecurity: Not on file  Transportation Needs: Not on file  Physical Activity: Insufficiently  Active (11/24/2017)   Exercise Vital Sign    Days of Exercise per Week: 2 days    Minutes of Exercise per Session: 20 min  Stress: Not on file  Social Connections: Not on file  Intimate Partner Violence: Not on file     BP 104/60   Pulse 96   Ht 5\' 6"  (1.676 m)   Wt 153 lb (69.4 kg)   SpO2 96%   BMI 24.69 kg/m   Physical Exam:  Well appearing NAD HEENT: Unremarkable Neck:  No JVD, no thyromegally Lymphatics:  No adenopathy Back:  No CVA tenderness Lungs:  Clear  HEART:  Regular rate rhythm, no murmurs, no rubs, no clicks Abd:  soft, positive bowel sounds, no organomegally, no rebound, no guarding Ext:  2 plus pulses, no edema, no cyanosis, no clubbing Skin:  No rashes no nodules Neuro:  CN II through XII intact, motor grossly intact  DEVICE  Normal device function.  See PaceArt for details.   Assess/Plan:  Atrial fib - her VR is still not under control. I have reviewed her Micra historgrams and her VR is over 100/min about 1/5 of the time. I have recommended she start low dose digoxin. Coags - she will continue her Eliquis.  Chronic diastolic heart failure - her symptoms are class 2. Hopefully she will improve with better rate control.  HTN - her bp is fairly well controlled. We will follow. Sharlot Gowda Mava Suares,MD

## 2023-06-28 NOTE — Patient Instructions (Signed)

## 2023-06-29 ENCOUNTER — Telehealth: Payer: Self-pay | Admitting: Internal Medicine

## 2023-06-29 DIAGNOSIS — I4821 Permanent atrial fibrillation: Secondary | ICD-10-CM

## 2023-06-29 NOTE — Telephone Encounter (Signed)
Spoke with patient and she stated you were going to prescribe a new medication to help with her increased heart rate. When she went to her pharmacy nothing was called in.  I see in your OV note from yesterday you suggested low dose digoxin. Would you for [place] to call in digoxin? Can you verify dose?

## 2023-06-29 NOTE — Telephone Encounter (Signed)
New Message:       Patient said she saw Dr Ladona Ridgel yesterday. She thought he said he was going to give her a medicine to slow her heart down. When she went to the pharmacy, nothing had been called in. Does he want her to take a new medicine?     Pt c/o medication issue:  1. Name of Medication: not sure of the name of the medicine  2. How are you currently taking this medication (dosage and times per day)?   3. Are you having a reaction (difficulty breathing--STAT)?   4. What is your medication issue? Wonder if Dr Ladona Ridgel wanted her to take  a new medicine

## 2023-06-30 MED ORDER — DIGOXIN 125 MCG PO TABS
0.0625 mg | ORAL_TABLET | Freq: Every day | ORAL | 3 refills | Status: AC
Start: 2023-06-30 — End: 2024-06-29

## 2023-06-30 NOTE — Telephone Encounter (Signed)
Spoke with patient she is aware digoxin has been sent to her pharmacy

## 2023-06-30 NOTE — Telephone Encounter (Signed)
Ok to start digoxin 0.125 mg, take a half tablet daily. Check a digoxin level in 3 weeks. GT

## 2023-07-04 ENCOUNTER — Other Ambulatory Visit: Payer: Self-pay | Admitting: Cardiology

## 2023-07-18 ENCOUNTER — Telehealth: Payer: Self-pay | Admitting: Internal Medicine

## 2023-07-18 ENCOUNTER — Other Ambulatory Visit: Payer: Self-pay

## 2023-07-18 DIAGNOSIS — Z79899 Other long term (current) drug therapy: Secondary | ICD-10-CM

## 2023-07-18 NOTE — Telephone Encounter (Signed)
Spoke with pt and advised we have the order and lab hours. Pt verbalized understanding and had no additional questions.

## 2023-07-18 NOTE — Telephone Encounter (Signed)
Pt is requesting a callback from nurse regarding her starting digoxin (LANOXIN) 0.125 MG tablet. She stated she was advised to have lab work done after taking it but didn't state how long after. Please advise

## 2023-07-18 NOTE — Telephone Encounter (Signed)
Spoke with Pt and told her she should have her labs drawn this week or the beginning of next week.  Orders placed and released to [facility].

## 2023-07-26 LAB — DIGOXIN LEVEL: Digoxin, Serum: 0.7 ng/mL (ref 0.5–0.9)

## 2023-07-28 ENCOUNTER — Telehealth: Payer: Self-pay | Admitting: Internal Medicine

## 2023-07-28 DIAGNOSIS — I4821 Permanent atrial fibrillation: Secondary | ICD-10-CM

## 2023-07-28 MED ORDER — DIGOXIN 125 MCG PO TABS: 0.0625 mg | ORAL_TABLET | Freq: Every day | ORAL | 3 refills | Status: DC

## 2023-07-28 NOTE — Telephone Encounter (Signed)
Patient states she mistakenly took 1 whole tablet instead of a half tablet of digoxin. Directions correct on Rx label, verified by patient over the phone.  Recent digoxin level from 07/25/2023 was 0.7 (still needs to be reviewed by MD).  Patient states she needs new Rx sent to [facility] because they state it is too early to refill.  Patient states her legs have felt more tired, almost "numb" since restarting digoxin at whole tablet. She states her HR has been down into the 60's, down from 100's.  Patient will begin taking half tablet of digoxin starting tomorrow. Will forward message to Dr. Ladona Ridgel to make him aware.  Informed patient Dr. Ladona Ridgel is out of the office until 10/7, but will forward to him and his nurse to review.

## 2023-07-28 NOTE — Telephone Encounter (Signed)
Pt c/o medication issue:  1. Name of Medication:   digoxin (LANOXIN) 0.125 MG tablet    2. How are you currently taking this medication (dosage and times per day)? As written   3. Are you having a reaction (difficulty breathing--STAT)? No   4. What is your medication issue?  Pt called in she was confused on how she should be taking this medication. She has been taking a whole pill but should only be taking half. She has ran out and the pharmacy told her its too early to pick up. Please advise.

## 2023-08-10 ENCOUNTER — Ambulatory Visit: Payer: Medicare Other

## 2023-08-10 DIAGNOSIS — I495 Sick sinus syndrome: Secondary | ICD-10-CM | POA: Diagnosis not present

## 2023-08-14 NOTE — Telephone Encounter (Signed)
Taking a single extra dose of digoxin is ok. GT

## 2023-08-15 LAB — CUP PACEART REMOTE DEVICE CHECK
Battery Remaining Longevity: 96 mo
Battery Voltage: 2.99 V
Brady Statistic AS VP Percent: 0.09 %
Brady Statistic AS VS Percent: 0.02 %
Brady Statistic RV Percent Paced: 0.41 %
Date Time Interrogation Session: 20241012124213
Implantable Pulse Generator Implant Date: 20200210
Lead Channel Impedance Value: 640 Ohm
Lead Channel Pacing Threshold Amplitude: 0.625 V
Lead Channel Pacing Threshold Pulse Width: 0.24 ms
Lead Channel Sensing Intrinsic Amplitude: 25.65 mV
Lead Channel Setting Pacing Amplitude: 1.5 V
Lead Channel Setting Pacing Pulse Width: 0.24 ms
Lead Channel Setting Sensing Sensitivity: 2 mV

## 2023-08-24 NOTE — Progress Notes (Signed)
Remote pacemaker transmission.   

## 2023-10-10 ENCOUNTER — Telehealth: Payer: Self-pay | Admitting: Endocrinology

## 2023-10-10 ENCOUNTER — Other Ambulatory Visit: Payer: Self-pay

## 2023-10-10 DIAGNOSIS — E890 Postprocedural hypothyroidism: Secondary | ICD-10-CM

## 2023-10-10 MED ORDER — LEVOTHYROXINE SODIUM 112 MCG PO TABS
112.0000 ug | ORAL_TABLET | Freq: Every day | ORAL | 1 refills | Status: DC
Start: 2023-10-10 — End: 2024-03-26

## 2023-10-10 NOTE — Telephone Encounter (Signed)
Patient called to get refill of Thyroid medication and in mid-call requested appointment before refill is done - scheduled for 10/11/23 @ 220P

## 2023-10-11 ENCOUNTER — Encounter: Payer: Self-pay | Admitting: Endocrinology

## 2023-10-11 ENCOUNTER — Ambulatory Visit (INDEPENDENT_AMBULATORY_CARE_PROVIDER_SITE_OTHER): Payer: Medicare Other | Admitting: Endocrinology

## 2023-10-11 VITALS — BP 130/60 | HR 74 | Resp 20 | Ht 66.0 in | Wt 155.4 lb

## 2023-10-11 DIAGNOSIS — E210 Primary hyperparathyroidism: Secondary | ICD-10-CM | POA: Diagnosis not present

## 2023-10-11 DIAGNOSIS — M810 Age-related osteoporosis without current pathological fracture: Secondary | ICD-10-CM

## 2023-10-11 DIAGNOSIS — E890 Postprocedural hypothyroidism: Secondary | ICD-10-CM | POA: Diagnosis not present

## 2023-10-11 DIAGNOSIS — E559 Vitamin D deficiency, unspecified: Secondary | ICD-10-CM | POA: Diagnosis not present

## 2023-10-11 NOTE — Progress Notes (Signed)
Outpatient Endocrinology Note [place] Karsin Pesta, MD  10/11/23  Patient's Name: April Padilla    DOB: 1945/11/18    MRN: [id number]  REASON OF VISIT: Follow-up for hypothyroidism /osteoporosis  PCP: Hadley Pen, MD  HISTORY OF PRESENT ILLNESS:   April Padilla is a 77 y.o. old female with past medical history as listed below is presented for a follow up for postablative hypothyroidism and osteoporosis /hypercalcemia.   Pertinent  History: Patient was previously seen by Dr. Lucianne Muss and was last time seen in June 2024. 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  No details available to review.  She has been on thyroid hormone replacement/levothyroxine since the diagnosis.  Levothyroxine dose was adjusted periodically in the past. Lately she has been taking levothyroxine 112 mcg daily.  # Osteoporosis :  -Patient was known to have osteopenia, was diagnosed in 2019.  Bone density in 08/2020 is stable to improved, has not had any further follow-up     Lumbar spine L1-L4 Femoral neck (FN) 33% distal radius  T-score   -1.3 RFN: -1.6 LFN: -1.8 -2.5  Change in BMD from previous DXA test (%) Up 3.5%* Up 2.0% Down 0.5%  (*) statistically significant  -She has not been on pharmacological medication for bone health.  She has been maintained on vitamin D supplement 2000 international unit daily for vitamin D deficiency.  She has history of vitamin D deficiency with level of 15 in 2019.  -DEXA scan at [facility] down on July 06, 2023 Lunar iDXA system by [employer], consistent with osteoporosis based on T-score of -2.6 at right total femur.  AP spine L1-L4 T-score -2.1.  Most recent DEXA scan in outside facility [facility] cannot be compared with DEXA scan done in November 2021 as they were done in different places on different medicine.  -No history of smoking, alcohol intake, no kidney stone.  She  has GERD and taking antiacid medication /famotidine regularly.  No history of fragility fracture.  # Hypercalcemia : -She has intermittent mild hypercalcemia with mildly elevated PTH, in 2019 possibly mild primary hyperparathyroidism.  Mostly serum calcium is upper normal.  Highest calcium in the past was 10.8.  PTH in 2019 was 76 with upper normal limit of 65.  Interval history Patient has been taking levothyroxine 112 mcg daily.  Denies palpitation or heat intolerance.  No hypo and hyperthyroid symptoms.  She reports compliance.  She has no fall and fracture.  She has been taking vitamin D3 1 tablet daily, does not recall the dose.  She has trigger finger and going to see hand surgeon in few days.  DXA scan in September in outside facility as reviewed and noted above.  REVIEW OF SYSTEMS:  As per history of present illness.   PAST MEDICAL HISTORY: 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   Chest discomfort 12/23/2018   Chronic atrial fibrillation (HCC) 01/26/2016   Last Assessment & Plan:  Has had ablation through [facility] , her BP here was stable , discussed her current meds and the S/E , she is off of the lasix now and will see her CMP for her as well as CK today for her muscle cramps she has had long before her ablation with possible pain from the statin   Chronic fatigue 07/25/2016   Chronic idiopathic constipation 01/26/2016   Dysrhythmia    on toprol for fast  heart beat   Encounter for care of pacemaker 12/19/2018   Essential (primary) hypertension 01/26/2016   Family history of colon cancer in mother 07/17/2017   GERD (gastroesophageal reflux disease) 07/17/2017   H/O hiatal hernia    High risk medication use 01/26/2016   History of adenomatous polyp of colon 07/17/2017   Hypercalcemia 01/26/2016   Hypothyroidism    pt. states she took radioactive pill and has no thyroid   Irregular heart rate 07/06/2018   Left  shoulder pain 07/06/2018   Long term current use of anticoagulant 07/06/2018   Mixed hyperlipidemia 01/26/2016   Myalgia 05/18/2016   Last Assessment & Plan:  Formatting of this note might be different from the original. 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 ago   left foot   FOOT SURGERY  2009   took knot out  and put in screws   KNEE ARTHROSCOPY W/ MENISCAL REPAIR  2009   left   TOTAL KNEE ARTHROPLASTY  01/16/2012   Procedure: TOTAL KNEE ARTHROPLASTY;  Surgeon: Raymon Mutton, MD;  Location: [facility] OR;  Service: Orthopedics;  Laterality: Left;    ALLERGIES: Allergies  Allergen Reactions   Clindamycin/Lincomycin Rash   Nitrofurantoin Monohyd Macro Rash   Silver Sulfadiazine Rash    FAMILY HISTORY:  Family History  Problem Relation Age of Onset   Cancer Mother    Thyroid disease Mother    Stroke Father    Anesthesia problems Neg Hx    Hypotension Neg Hx    Malignant hyperthermia Neg Hx    Pseudochol deficiency Neg Hx    Sleep apnea Neg Hx     SOCIAL HISTORY: Social History   Socioeconomic  History   Marital status: Married    Spouse name: Not on file   Number of children: Not on file   Years of education: Not on file   Highest education level: Not on file  Occupational History   Not on file  Tobacco Use   Smoking status: Never   Smokeless tobacco: Never  Substance and Sexual Activity   Alcohol use: No    Comment: occasional   Drug use: No   Sexual activity: Yes  Other Topics Concern   Not on file  Social History Narrative   Not on file   Social Determinants of Health   Financial Resource Strain: Not on file  Food Insecurity: Not on file  Transportation Needs: Not on file  Physical Activity: Insufficiently Active (11/24/2017)   Exercise Vital Sign    Days of Exercise per Week: 2 days    Minutes of Exercise per Session: 20 min  Stress: Not on  file  Social Connections: Not on file    MEDICATIONS:  Current Outpatient Medications  Medication Sig Dispense Refill   ALPRAZolam (XANAX) 0.5 MG tablet Take 0.5 mg by mouth daily as needed for anxiety or sleep.     cholecalciferol (VITAMIN D3) 25 MCG (1000 UNIT) tablet Take 2,000 Units by mouth daily.     digoxin (LANOXIN) 0.125 MG tablet Take 0.5 tablets (0.0625 mg total) by mouth daily. 45 tablet 3   ELIQUIS 5 MG TABS tablet TAKE 1 TABLET BY MOUTH TWICE A DAY 180 tablet 3   famotidine (PEPCID) 20 MG tablet Take 1 tablet by mouth in the morning and at bedtime.     furosemide (LASIX) 20 MG tablet Take 1 tablet (20 mg total) by mouth as needed for fluid or edema. 90 tablet 3   levothyroxine (SYNTHROID) 112 MCG tablet Take 1 tablet (112 mcg total) by mouth daily before breakfast. 90 tablet 1   magnesium oxide (MAG-OX) 400 MG tablet Take 400 mg by mouth daily.     metoprolol succinate (TOPROL XL) 25 MG 24 hr tablet Take 1 tablet (25 mg total) by mouth daily. (Patient taking differently: Take 25 mg by mouth daily. Pt takes at bedtime) 90 tablet 3   rosuvastatin (CRESTOR) 5 MG tablet Take 5 mg by mouth daily.     verapamil  (CALAN-SR) 180 MG CR tablet Take 0.5 tablets (90 mg total) by mouth daily. 45 tablet 3   No current facility-administered medications for this visit.    PHYSICAL EXAM: Vitals:   10/11/23 1332  BP: 130/60  Pulse: 74  Resp: 20  SpO2: 99%  Weight: 155 lb 6.4 oz (70.5 kg)  Height: 5\' 6"  (1.676 m)   Body mass index is 25.08 kg/m.  Wt Readings from Last 3 Encounters:  10/11/23 155 lb 6.4 oz (70.5 kg)  06/28/23 153 lb (69.4 kg)  05/30/23 155 lb (70.3 kg)    General: Well developed, well nourished female in no apparent distress.  HEENT: AT/[place], no external lesions. Hearing intact to the spoken word Eyes: EOMI. No stare, proptosis or lid lag. Conjunctiva clear and no icterus. Neck: Trachea midline, neck supple without appreciable thyromegaly or lymphadenopathy and no palpable thyroid nodules Lungs: Clear to auscultation, no wheeze. Respirations not labored Heart: S1S2, Regular in rate and rhythm.  Abdomen: Soft, non tender, non distended Neurologic: Alert, oriented, normal speech, deep tendon biceps reflexes normal,  no gross focal neurological deficit Extremities: No pedal pitting edema, no tremors of outstretched hands Skin: Warm, color good.  Psychiatric: Does not appear depressed or anxious  PERTINENT HISTORIC LABORATORY AND IMAGING STUDIES:  All pertinent laboratory results were reviewed. Please see HPI also for further details.   TSH  Date Value Ref Range Status  04/26/2023 1.56 0.35 - 5.50 uIU/mL Final  02/10/2023 0.58 0.35 - 5.50 uIU/mL Final  12/29/2022 0.12 (L) 0.35 - 5.50 uIU/mL Final     ASSESSMENT / PLAN  1. Age-related osteoporosis without current pathological fracture   2. Primary hyperparathyroidism (HCC)   3. Postablative hypothyroidism   4. Vitamin D deficiency    # Osteoporosis -Patient had a history of osteoporosis at least from 2019.  Most recent DEXA scan in September 2024 in outside facility [facility] consistent with osteoporosis with lowest  T-score of -2.6 at right total femur.  Plan: -I recommended pharmacological treatment possibly alendronate/Fosamax for osteoporosis.  She has GERD, if GERD her symptoms worsen, can be switched to injectable pharmacological treatment.  Patient does  not want to be on pharmacological treatment for bone health at this time, she wants to avoid as much as possible. -Discussed about potential side effect of alendronate.  Provided written instruction for Fosamax and asked patient to call if she is willing to restart it. -Discussed about weightbearing exercise.  Discussed fall precaution. -Advised to take calcium around 500 mg over-the-counter daily.  Discussed that it is okay to take calcium supplement maintenance dose although she has intermittent history of hypercalcemia.  # Postablative hypothyroidism -She has longstanding history of radioactive iodine therapy for hypothyroidism developing postablative hypothyroidism. -Currently taking levothyroxine 112 mcg daily. -Will check thyroid function test today and adjust the dose of levothyroxine as needed.  # Hypercalcemia/hyperparathyroidism/vitamin D deficiency -She possibly has mild primary hyperparathyroidism.  Serum calcium has been intermittently mildly high however mostly in high normal range. -I would like to check PTH, renal function panel and vitamin D today.  Diagnoses and all orders for this visit:  Age-related osteoporosis without current pathological fracture  Primary hyperparathyroidism (HCC) -     Renal function panel -     Parathyroid hormone, intact (no Ca)  Postablative hypothyroidism -     T4, free -     TSH  Vitamin D deficiency -     VITAMIN D 25 Hydroxy (Vit-D Deficiency, Fractures)    DISPOSITION Follow up in clinic in 6 months suggested.  All questions answered and patient verbalized understanding of the plan.  [place] Kealan Buchan, MD [facility] [address] [place], [address] [place], [place] [address] Phone # [phone]  At least part of this note was generated using voice recognition software. Inadvertent word errors may have occurred, which were not recognized during the proofreading process.

## 2023-10-11 NOTE — Patient Instructions (Signed)
Alendronate Solution What is this medication? ALENDRONATE (a LEN droe nate) treats osteoporosis. It works by Paramedic stronger and less likely to break (fracture). It belongs to a group of medications called bisphosphonates. This medicine may be used for other purposes; ask your health care provider or pharmacist if you have questions. COMMON BRAND NAME(S): Fosamax What should I tell my care team before I take this medication? They need to know if you have any of these conditions: Bleeding disorder Cancer Dental disease Difficulty swallowing Infection (fever, chills, cough, sore throat, pain or trouble passing urine) Kidney disease Low levels of calcium or other minerals in the blood Low red blood cell counts Receiving steroids like dexamethasone or prednisone Stomach or intestine problems Trouble sitting or standing for 30 minutes An unusual or allergic reaction to alendronate, other medications, foods, dyes or preservatives Pregnant or trying to get pregnant Breast-feeding How should I use this medication? Take this medication by mouth with a full glass of water. Take it as directed on the prescription label at the same time every day. Use a specially marked oral syringe, spoon, or dropper to measure each dose. Ask your pharmacist if you do not have one. Household spoons are not accurate. Take the dose right after waking up. Do not eat or drink anything before taking it. Do not take it with any other drink except water. After taking it, do not eat breakfast, drink, or take any other medications or vitamins for at least 30 minutes. Sit or stand up for at least 30 minutes after you take it. Do not lie down. Keep taking it unless your care team tells you to stop. A special MedGuide will be given to you by the pharmacist with each prescription and refill. Be sure to read this information carefully each time. Talk to your care team about the use of this medication in children. Special  care may be needed. Overdosage: If you think you have taken too much of this medicine contact a poison control center or emergency room at once. NOTE: This medicine is only for you. Do not share this medicine with others. What if I miss a dose? If you take your medication once a day, skip it. Take your next dose at the scheduled time the next morning. Do not take two doses on the same day. If you take your medication once a week, take the missed dose on the morning after you remember. Do not take two doses on the same day. What may interact with this medication? Aluminum hydroxide Antacids Aspirin Calcium supplements Iron supplements Magnesium supplements Medications for inflammation like ibuprofen, naproxen, and others Vitamins with minerals This list may not describe all possible interactions. Give your health care provider a list of all the medicines, herbs, non-prescription drugs, or dietary supplements you use. Also tell them if you smoke, drink alcohol, or use illegal drugs. Some items may interact with your medicine. What should I watch for while using this medication? Visit your care team for regular checks on your progress. It may be some time before you see the benefit from this medication. Some people who take this medication have severe bone, joint, or muscle pain. This medication may also increase your risk for jaw problems or a broken thigh bone. Tell your care team right away if you have severe pain in your jaw, bones, joints, or muscles. Tell you care team if you have any pain that does not go away or that gets worse. Tell your dentist  and dental surgeon that you are taking this medication. You should not have major dental surgery while on this medication. See your dentist to have a dental exam and fix any dental problems before starting this medication. Take good care of your teeth while on this medication. Make sure you see your dentist for regular follow-up appointments. You  should make sure you get enough calcium and vitamin D while you are taking this medication. Discuss the foods you eat and the vitamins you take with your care team. You may need blood work done while you are taking this medication. What side effects may I notice from receiving this medication? Side effects that you should report to your care team as soon as possible: Allergic reactions--skin rash, itching, hives, swelling of the face, lips, tongue, or throat Low calcium level--muscle pain or cramps, confusion, tingling, or numbness in the hands or feet Osteonecrosis of the jaw--pain, swelling, or redness in the mouth, numbness of the jaw, poor healing after dental work, unusual discharge from the mouth, visible bones in the mouth Pain or trouble swallowing Severe bone, joint, or muscle pain Stomach bleeding--bloody or black, tar-like stools, vomiting blood or brown material that looks like coffee grounds Side effects that usually do not require medical attention (report to your care team if they continue or are bothersome): Constipation Diarrhea Nausea Stomach pain This list may not describe all possible side effects. Call your doctor for medical advice about side effects. You may report side effects to FDA at 1-800-FDA-1088. Where should I keep my medication? Keep out of the reach of children and pets. Store at room temperature between 20 and 25 degrees C (68 and 77 degrees F). Do not freeze. Throw away any unused medication after the expiration date. NOTE: This sheet is a summary. It may not cover all possible information. If you have questions about this medicine, talk to your doctor, pharmacist, or health care provider.  2024 Elsevier/Gold Standard (2020-10-15 00:00:00)

## 2023-10-12 LAB — RENAL FUNCTION PANEL
Albumin: 4.3 g/dL (ref 3.6–5.1)
BUN: 15 mg/dL (ref 7–25)
CO2: 30 mmol/L (ref 20–32)
Calcium: 10.5 mg/dL — ABNORMAL HIGH (ref 8.6–10.4)
Chloride: 104 mmol/L (ref 98–110)
Creat: 0.9 mg/dL (ref 0.60–1.00)
Glucose, Bld: 88 mg/dL (ref 65–99)
Phosphorus: 3.7 mg/dL (ref 2.1–4.3)
Potassium: 4.5 mmol/L (ref 3.5–5.3)
Sodium: 141 mmol/L (ref 135–146)

## 2023-10-12 LAB — T4, FREE: Free T4: 1.4 ng/dL (ref 0.8–1.8)

## 2023-10-12 LAB — PARATHYROID HORMONE, INTACT (NO CA): PTH: 137 pg/mL — ABNORMAL HIGH (ref 16–77)

## 2023-10-12 LAB — VITAMIN D 25 HYDROXY (VIT D DEFICIENCY, FRACTURES): Vit D, 25-Hydroxy: 49 ng/mL (ref 30–100)

## 2023-10-12 LAB — TSH: TSH: 1.95 m[IU]/L (ref 0.40–4.50)

## 2023-11-01 DIAGNOSIS — M65311 Trigger thumb, right thumb: Secondary | ICD-10-CM

## 2023-11-01 DIAGNOSIS — M65332 Trigger finger, left middle finger: Secondary | ICD-10-CM

## 2023-11-01 DIAGNOSIS — M19039 Primary osteoarthritis, unspecified wrist: Secondary | ICD-10-CM

## 2023-11-01 DIAGNOSIS — M180 Bilateral primary osteoarthritis of first carpometacarpal joints: Secondary | ICD-10-CM

## 2023-11-01 HISTORY — DX: Trigger thumb, right thumb: M65.311

## 2023-11-01 HISTORY — DX: Bilateral primary osteoarthritis of first carpometacarpal joints: M18.0

## 2023-11-01 HISTORY — DX: Primary osteoarthritis, unspecified wrist: M19.039

## 2023-11-01 HISTORY — DX: Trigger finger, left middle finger: M65.332

## 2023-11-08 LAB — CUP PACEART REMOTE DEVICE CHECK
Battery Remaining Longevity: 96 mo
Battery Voltage: 2.99 V
Brady Statistic AS VP Percent: 0.08 %
Brady Statistic AS VS Percent: 0.01 %
Brady Statistic RV Percent Paced: 0.28 %
Date Time Interrogation Session: 20250106191213
Implantable Pulse Generator Implant Date: 20200210
Lead Channel Impedance Value: 630 Ohm
Lead Channel Pacing Threshold Amplitude: 0.625 V
Lead Channel Pacing Threshold Pulse Width: 0.24 ms
Lead Channel Sensing Intrinsic Amplitude: 24.975 mV
Lead Channel Setting Pacing Amplitude: 1.5 V
Lead Channel Setting Pacing Pulse Width: 0.24 ms
Lead Channel Setting Sensing Sensitivity: 2 mV

## 2023-11-09 ENCOUNTER — Ambulatory Visit (INDEPENDENT_AMBULATORY_CARE_PROVIDER_SITE_OTHER): Payer: Medicare Other

## 2023-11-09 DIAGNOSIS — I495 Sick sinus syndrome: Secondary | ICD-10-CM

## 2023-11-10 ENCOUNTER — Telehealth: Payer: Self-pay | Admitting: Internal Medicine

## 2023-11-10 NOTE — Telephone Encounter (Signed)
 Pt c/o medication issue:  1. Name of Medication: digoxin  (LANOXIN ) 0.125 MG tablet   2. How are you currently taking this medication (dosage and times per day)?    3. Are you having a reaction (difficulty breathing--STAT)? no  4. What is your medication issue? Is asking that she get a prescription that says 0.0625. because to having it say .125 is hard to cut the pill in half. Please advise

## 2023-11-12 NOTE — Progress Notes (Signed)
 Cardiology Office Note:    Date:  11/13/2023   ID:  MOLLI GETHERS, DOB 02-Mar-1946, MRN [id number]  PCP:  Silver Lamar LABOR, MD   [facility] Providers Cardiologist:  Redell Leiter, MD Electrophysiologist:  Danelle Birmingham, MD     Referring MD: Silver Lamar LABOR, MD     History of Present Illness:    April Padilla is a 78 y.o. female with a hx of chronic atrial fibrillation CHA2DS2-VASc score of 4 on Eliquis, hypertension, PSVT, sick sinus syndrome s/p PPM, GERD, hyperparathyroidism, hypothyroidism, hyperlipidemia.  11/08/2023 device check normal device function, AV conduction mode switch 99.2% 11/19/2022 monitor A-fib burden 100% 11/11/2020 echo EF 65 to 70%, RV moderately enlarged, mildly elevated PASP 41 mmHg, mild MR 2020 Medtronic micra implantation PPM 2017 repeat ablation 2016 AF cryoablation  Previously tried: flecainide, propafenone, Multaq and popafenone   Evaluated by Dr. Leiter on 05/30/2023, her calcium  channel blocker was decreased, did not feel great overall, was advised she can follow-up in 6 months.  Evaluated by Dr. Birmingham on 06/28/2023, she has occasional palpitations, bothered by fatigue, discussions were had surrounding amiodarone however she preferred rate control strategy.  She presents today for follow-up of her atrial fibrillation and hypertension.  She does not feel great overall, she states this is her baseline now and she has not felt great in years since all of her issues with atrial fibrillation started.  She also complains of a discomfort in her upper abdomen, she typically notices it when she stands up, this could also be accompanied with dizziness.  She thinks it may be related to her GERD, she plans to reach out to her gastroenterologist. She denies chest pain, palpitations, dyspnea, pnd, orthopnea, n, v, dizziness, syncope, edema, weight gain, or early satiety.               Past Medical History:  Diagnosis Date   Cervical intraepithelial  neoplasia grade III with severe dysplasia 10/31/2000   Chest discomfort 12/23/2018   Chronic atrial fibrillation (HCC) 01/26/2016   Last Assessment & Plan:  Has had ablation through [facility] , her BP here was stable , discussed her current meds and the S/E , she is off of the lasix  now and will see her CMP for her as well as CK today for her muscle cramps she has had long before her ablation with possible pain from the statin   Chronic fatigue 07/25/2016   Chronic idiopathic constipation 01/26/2016   Dysrhythmia    on toprol  for fast heart beat   Encounter for care of pacemaker 12/19/2018   Essential (primary) hypertension 01/26/2016   Family history of colon cancer in mother 07/17/2017   GERD (gastroesophageal reflux disease) 07/17/2017   H/O hiatal hernia    High risk medication use 01/26/2016   History of adenomatous polyp of colon 07/17/2017   Hypercalcemia 01/26/2016   Hypothyroidism    pt. states she took radioactive pill and has no thyroid    Irregular heart rate 07/06/2018   Left shoulder pain 07/06/2018   Long term current use of anticoagulant 07/06/2018   Mixed hyperlipidemia 01/26/2016   Myalgia 05/18/2016   Last Assessment & Plan:  Formatting of this note might be different from the original. 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   Presence of left artificial knee joint 01/22/2015   Primary generalized (osteo)arthritis 01/26/2016   Primary hyperparathyroidism (HCC) 11/26/2017   Primary osteoarthritis involving multiple joints 01/26/2016   PSVT (paroxysmal supraventricular tachycardia) (HCC) 01/26/2016   Recurrent urticaria 01/26/2016   S/P TKR (total knee replacement) 12/27/2012   Shortness of breath    with exertion    Sinobronchitis 09/07/2018   Situational anxiety 09/07/2018   Thyroid  function test abnormal 08/19/2016   Vitamin D  deficiency 11/26/2017    Past Surgical History:  Procedure Laterality Date   BUNIONECTOMY  yrs. ago   left foot   FOOT SURGERY  2009   took knot out  and put in screws   KNEE ARTHROSCOPY W/ MENISCAL REPAIR  2009   left   TOTAL KNEE ARTHROPLASTY  01/16/2012   Procedure: TOTAL KNEE ARTHROPLASTY;  Surgeon: Garnette JONETTA Raman, MD;  Location: [facility] OR;  Service: Orthopedics;  Laterality: Left;    Current Medications: Current Meds  Medication Sig   ALPRAZolam (XANAX) 0.5 MG tablet Take 0.5 mg by mouth daily as needed for anxiety or sleep.   cholecalciferol (VITAMIN D3) 25 MCG (1000 UNIT) tablet Take 2,000 Units by mouth daily.   Digoxin  62.5 MCG TABS Take 62.5 mcg by mouth daily.   ELIQUIS 5 MG TABS tablet TAKE 1 TABLET BY MOUTH TWICE A DAY   famotidine  (PEPCID ) 20 MG tablet Take 1 tablet by mouth in the morning and at bedtime.   furosemide  (LASIX ) 20 MG tablet Take 1 tablet (20 mg total) by mouth as needed for fluid or edema.   levothyroxine  (SYNTHROID ) 112 MCG tablet Take 1 tablet (112 mcg total) by mouth daily before breakfast.   magnesium oxide (MAG-OX) 400 MG tablet Take 400 mg by mouth daily.   metoprolol  succinate (TOPROL  XL) 25 MG 24 hr tablet Take 1 tablet (25 mg total) by mouth daily.   rosuvastatin (CRESTOR) 5 MG tablet Take 5 mg by mouth daily.   verapamil  (CALAN -SR) 180 MG CR tablet Take 0.5 tablets (90 mg total) by mouth daily.   [DISCONTINUED] digoxin  (LANOXIN ) 0.125 MG tablet Take 0.5 tablets (0.0625 mg total) by mouth daily.     Allergies:   Clindamycin/lincomycin, Nitrofurantoin monohyd macro, and Silver sulfadiazine   Social History   Socioeconomic History   Marital status: Married    Spouse name: Not on file   Number of children: Not on file   Years of education: Not on file   Highest education level: Not on file  Occupational History   Not on file   Tobacco Use   Smoking status: Never   Smokeless tobacco: Never  Substance and Sexual Activity   Alcohol use: No    Comment: occasional   Drug use: No   Sexual activity: Yes  Other Topics Concern   Not on file  Social History Narrative   Not on file   Social Drivers of Health   Financial Resource Strain: Not on file  Food Insecurity: Not on file  Transportation Needs: Not on file  Physical Activity: Insufficiently Active (11/24/2017)   Exercise Vital Sign    Days of Exercise per Week: 2 days    Minutes of Exercise per Session: 20 min  Stress: Not on file  Social Connections: Not on file     Family History: The patient's family history includes Cancer in her mother; Stroke in her father; Thyroid  disease in her mother. There is no history of  Anesthesia problems, Hypotension, Malignant hyperthermia, Pseudochol deficiency, or Sleep apnea.  ROS:   Please see the history of present illness.     All other systems reviewed and are negative.  EKGs/Labs/Other Studies Reviewed:    The following studies were reviewed today: Cardiac Studies & Procedures      ECHOCARDIOGRAM  ECHOCARDIOGRAM COMPLETE 11/11/2020  Narrative ECHOCARDIOGRAM REPORT    Patient Name:   TENIA GOH Date of Exam: 11/11/2020 Medical Rec #:  [id number]        Height:       65.0 in Accession #:    [id number]       Weight:       164.4 lb Date of Birth:  [birth year]        BSA:          1.820 m Patient Age:    74 years         BP:           129/74 mmHg Patient Gender: F                HR:           91 bpm. Exam Location:  [address]  Procedure: 2D Echo, Cardiac Doppler and Color Doppler  Indications:    I11.9  History:        Patient has no prior history of Echocardiogram examinations. 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  Sonographer:    Elsie Bohr RDCS Referring Phys: [id number] BRIAN J  MUNLEY  IMPRESSIONS   1. Left ventricular ejection fraction, by estimation, is 65 to 70%. The left ventricle has normal function. The left ventricle has no regional wall motion abnormalities. There is mild concentric left ventricular hypertrophy. Diastolic function indeterminant due to atrial fibrillation. 2. Right ventricular systolic function is normal. The right ventricular size is moderately enlarged. There is mildly elevated pulmonary artery systolic pressure. The estimated right ventricular systolic pressure is 41.4 mmHg. 3. Left atrial size was moderately dilated. 4. Right atrial size was severely dilated. 5. The mitral valve is normal in structure. Mild mitral valve regurgitation. 6. The aortic valve is tricuspid. There is mild thickening of the aortic valve. Aortic valve regurgitation is not visualized. No aortic stenosis is present. 7. The inferior vena cava is normal in size with <50% respiratory variability, suggesting right atrial pressure of 8 mmHg.  Comparison(s): No prior Echocardiogram.  FINDINGS Left Ventricle: Left ventricular ejection fraction, by estimation, is 65 to 70%. The left ventricle has normal function. The left ventricle has no regional wall motion abnormalities. The left ventricular internal cavity size was normal in size. There is mild concentric left ventricular hypertrophy. Diastolic function indeterminant due to atrial fibrillation.  Right Ventricle: The right ventricular size is moderately enlarged. Right vetricular wall thickness was not well visualized. Right ventricular systolic function is normal. There is mildly elevated pulmonary artery systolic pressure. The tricuspid regurgitant velocity is 2.89 m/s, and with an assumed right atrial pressure of 8 mmHg, the estimated right ventricular systolic pressure is 41.4 mmHg.  Left Atrium: Left atrial size was moderately dilated.  Right Atrium: Right atrial size was severely dilated.  Pericardium: There is  no evidence of pericardial effusion.  Mitral Valve: The mitral valve is normal in structure. Mild mitral valve regurgitation.  Tricuspid Valve: The tricuspid valve is normal in structure. Tricuspid valve regurgitation is mild.  Aortic Valve: The aortic valve is tricuspid. There is mild thickening of the  aortic valve. Aortic valve regurgitation is not visualized. No aortic stenosis is present.  Pulmonic Valve: The pulmonic valve was normal in structure. Pulmonic valve regurgitation is mild.  Aorta: The aortic root and ascending aorta are structurally normal, with no evidence of dilitation.  Venous: The inferior vena cava is normal in size with less than 50% respiratory variability, suggesting right atrial pressure of 8 mmHg.  IAS/Shunts: No atrial level shunt detected by color flow Doppler.  Additional Comments: A pacer wire is visualized.   LEFT VENTRICLE PLAX 2D LVIDd:         4.20 cm  Diastology LVIDs:         2.10 cm  LV e' medial:    7.18 cm/s LV PW:         0.90 cm  LV E/e' medial:  18.2 LV IVS:        1.20 cm  LV e' lateral:   10.87 cm/s LVOT diam:     1.70 cm  LV E/e' lateral: 12.0 LV SV:         40 LV SV Index:   22 LVOT Area:     2.27 cm   RIGHT VENTRICLE             IVC RV S prime:     10.78 cm/s  IVC diam: 1.60 cm TAPSE (M-mode): 1.8 cm RVSP:           36.4 mmHg  LEFT ATRIUM             Index       RIGHT ATRIUM           Index LA diam:        4.60 cm 2.53 cm/m  RA Pressure: 3.00 mmHg LA Vol (A2C):   84.8 ml 46.59 ml/m RA Area:     19.50 cm LA Vol (A4C):   57.0 ml 31.32 ml/m RA Volume:   57.30 ml  31.48 ml/m LA Biplane Vol: 71.7 ml 39.40 ml/m AORTIC VALVE LVOT Vmax:   90.18 cm/s LVOT Vmean:  58.380 cm/s LVOT VTI:    0.178 m  AORTA Ao Root diam: 3.00 cm Ao Asc diam:  2.90 cm  MV E velocity: 130.40 cm/s  TRICUSPID VALVE TR Peak grad:   33.4 mmHg TR Vmax:        289.00 cm/s Estimated RAP:  3.00 mmHg RVSP:           36.4 mmHg  SHUNTS Systemic VTI:   0.18 m Systemic Diam: 1.70 cm  Powell Sorrow MD Electronically signed by Powell Sorrow MD Signature Date/Time: 11/11/2020/2:46:36 PM    Final   MONITORS  LONG TERM MONITOR (3-14 DAYS) 11/29/2022  Narrative Patch Wear Time:  7 days and 5 hours (2024-01-16T15:00:05-499 to 2024-01-23T20:03:14-0500)  Atrial Fibrillation occurred continuously (100% burden), ranging from 47-107 bpm (avg of 70 bpm).  Heart rate response was well-controlled atrial fibrillation  There is symptomatic events noted to with brief runs of accelerated idioventricular rhythm  Idioventricular Rhythm was present. Idioventricular Rhythm was detected within +/- 45 seconds of symptomatic patient event(s).  Isolated VEs were rare (<1.0%, 1681), VE Couplets were rare (<1.0%, 232), and VE Triplets were rare (<1.0%, 90). Ventricular Bigeminy was present.            EKG:  EKG is  ordered today.  The ekg ordered today demonstrates atrial fibrillation with competing junctional pacemaker, heart rate 69 bpm.  Recent Labs: 10/11/2023: BUN 15; Creat 0.90; Potassium 4.5; Sodium 141; TSH  1.95  Recent Lipid Panel    Component Value Date/Time   CHOL 179 12/28/2021 1120   TRIG 143 12/28/2021 1120   HDL 87 12/28/2021 1120   CHOLHDL 2.1 12/28/2021 1120   LDLCALC 68 12/28/2021 1120     Risk Assessment/Calculations:    CHA2DS2-VASc Score = 4   This indicates a 4.8% annual risk of stroke. The patient's score is based upon: CHF History: 0 HTN History: 1 Diabetes History: 0 Stroke History: 0 Vascular Disease History: 0 Age Score: 2 Gender Score: 1               Physical Exam:    VS:  BP 120/62 (BP Location: Right Arm, Patient Position: Sitting, Cuff Size: Small)   Pulse 76   Ht 5' 6 (1.676 m)   Wt 156 lb (70.8 kg)   SpO2 97%   BMI 25.18 kg/m     Wt Readings from Last 3 Encounters:  11/13/23 156 lb (70.8 kg)  10/11/23 155 lb 6.4 oz (70.5 kg)  06/28/23 153 lb (69.4 kg)     GEN:  Well  nourished, well developed in no acute distress HEENT: Normal NECK: No JVD; No carotid bruits LYMPHATICS: No lymphadenopathy CARDIAC: Irregularly irregular, no murmurs, rubs, gallops RESPIRATORY:  Clear to auscultation without rales, wheezing or rhonchi  ABDOMEN: Soft, non-tender, non-distended MUSCULOSKELETAL: Trace pedal edema; No deformity  SKIN: Warm and dry NEUROLOGIC:  Alert and oriented x 3 PSYCHIATRIC:  Normal affect   ASSESSMENT:    1. Permanent atrial fibrillation (HCC)   2. Secondary hypercoagulable state (HCC)   3. Cardiac pacemaker in situ   4. Sick sinus syndrome (HCC)   5. Encounter for monitoring digoxin  therapy     PLAN:    In order of problems listed above:  Chronic atrial fibrillation/hypercoagulable state/on digoxin - s/p ablation, has decided on rate control strategies. CHA2DS2-VASc score of 4, EKG reveals atrial fibrillation rate controlled 76 bpm today. Continue Eliquis 5 mg twice daily--no indication for dose reduction, continue metoprolol  25 mg daily, continue verapamil  90 mg once daily, continue digoxin  0.0625 mg daily.  Creatinine was checked on 10/11/2023 0.90, will repeat CBC.  Will check digoxin  level.  Hypertension-blood pressure today is 120/62 in the office, she is, start keeping a check on her blood pressure at home, she has episodes where she is not feeling her when she stands up, has lower abdominal pain, which could be related to orthostasis.  Sick sinus syndrome s/p PPM 2020-recent device check was normal, followed by EP.   Hypothyroidism-follows with endocrinology    Disposition -digoxin  level, CBC, start checking blood pressure daily--notify our office if they are persistently less than 100 systolic, follow-up in 6 months           Medication Adjustments/Labs and Tests Ordered: Current medicines are reviewed at length with the patient today.  Concerns regarding medicines are outlined above.  Orders Placed This Encounter  Procedures    CBC with Differential/Platelet   Digoxin  level   EKG 12-Lead   Meds ordered this encounter  Medications   Digoxin  62.5 MCG TABS    Sig: Take 62.5 mcg by mouth daily.    Dispense:  90 tablet    Refill:  3    Patient Instructions  Medication Instructions:   No changes  *If you need a refill on your cardiac medications before your next appointment, please call your pharmacy*   Lab Work:   Come back one day for digoxin  level and CBC ( that  morning skip your digoxin  and then you can take after you leave).   If you have labs (blood work) drawn today and your tests are completely normal, you will receive your results only by: MyChart Message (if you have MyChart) OR A paper copy in the mail If you have any lab test that is abnormal or we need to change your treatment, we will call you to review the results.   Testing/Procedures:  None   Follow-Up: At [facility], you and your health needs are our priority.  As part of our continuing mission to provide you with exceptional heart care, we have created designated Provider Care Teams.  These Care Teams include your primary Cardiologist (physician) and Advanced Practice Providers (APPs -  Physician Assistants and Nurse Practitioners) who all work together to provide you with the care you need, when you need it.  We recommend signing up for the patient portal called MyChart.  Sign up information is provided on this After Visit Summary.  MyChart is used to connect with patients for Virtual Visits (Telemedicine).  Patients are able to view lab/test results, encounter notes, upcoming appointments, etc.  Non-urgent messages can be sent to your provider as well.   To learn more about what you can do with MyChart, go to [email or web address].    Your next appointment:   6 month(s)  Provider:   Redell Leiter, MD    Other Instructions   Check your BP about two hours after your am meds, if the first number is persistently <  100, let us  know.           Signed, Delon JAYSON Hoover, NP  11/13/2023 2:30 PM    [facility]

## 2023-11-13 ENCOUNTER — Ambulatory Visit: Payer: Medicare Other | Attending: Cardiology | Admitting: Cardiology

## 2023-11-13 ENCOUNTER — Encounter: Payer: Self-pay | Admitting: Cardiology

## 2023-11-13 ENCOUNTER — Encounter: Payer: Self-pay | Admitting: *Deleted

## 2023-11-13 VITALS — BP 120/62 | HR 76 | Ht 66.0 in | Wt 156.0 lb

## 2023-11-13 DIAGNOSIS — Z79899 Other long term (current) drug therapy: Secondary | ICD-10-CM | POA: Diagnosis present

## 2023-11-13 DIAGNOSIS — Z950 Presence of cardiac pacemaker: Secondary | ICD-10-CM

## 2023-11-13 DIAGNOSIS — I495 Sick sinus syndrome: Secondary | ICD-10-CM | POA: Diagnosis not present

## 2023-11-13 DIAGNOSIS — Z5181 Encounter for therapeutic drug level monitoring: Secondary | ICD-10-CM | POA: Diagnosis present

## 2023-11-13 DIAGNOSIS — I4821 Permanent atrial fibrillation: Secondary | ICD-10-CM

## 2023-11-13 DIAGNOSIS — D6869 Other thrombophilia: Secondary | ICD-10-CM | POA: Diagnosis not present

## 2023-11-13 MED ORDER — DIGOXIN 62.5 MCG PO TABS: 62.5000 ug | ORAL_TABLET | Freq: Every day | ORAL | 3 refills | Status: DC

## 2023-11-13 NOTE — Patient Instructions (Signed)
 Medication Instructions:   No changes  *If you need a refill on your cardiac medications before your next appointment, please call your pharmacy*   Lab Work:   Come back one day for digoxin  level and CBC ( that morning skip your digoxin  and then you can take after you leave).   If you have labs (blood work) drawn today and your tests are completely normal, you will receive your results only by: MyChart Message (if you have MyChart) OR A paper copy in the mail If you have any lab test that is abnormal or we need to change your treatment, we will call you to review the results.   Testing/Procedures:  None   Follow-Up: At [facility], you and your health needs are our priority.  As part of our continuing mission to provide you with exceptional heart care, we have created designated Provider Care Teams.  These Care Teams include your primary Cardiologist (physician) and Advanced Practice Providers (APPs -  Physician Assistants and Nurse Practitioners) who all work together to provide you with the care you need, when you need it.  We recommend signing up for the patient portal called MyChart.  Sign up information is provided on this After Visit Summary.  MyChart is used to connect with patients for Virtual Visits (Telemedicine).  Patients are able to view lab/test results, encounter notes, upcoming appointments, etc.  Non-urgent messages can be sent to your provider as well.   To learn more about what you can do with MyChart, go to [email or web address].    Your next appointment:   6 month(s)  Provider:   Redell Leiter, MD    Other Instructions   Check your BP about two hours after your am meds, if the first number is persistently < 100, let us  know.

## 2023-11-28 ENCOUNTER — Telehealth: Payer: Self-pay | Admitting: Internal Medicine

## 2023-11-28 NOTE — Telephone Encounter (Signed)
Pt was calling to schedule her f/u appt but I informed her the calendar for August hadn't opened yet since she was advised to be seen again in a year. Now pt is requesting a callback to see why she needs to be seen once a year now instead of every 6 months as she was before. Please advise

## 2023-12-20 NOTE — Progress Notes (Signed)
 Remote pacemaker transmission.

## 2024-01-01 ENCOUNTER — Telehealth: Payer: Self-pay | Admitting: Internal Medicine

## 2024-01-01 NOTE — Telephone Encounter (Signed)
 Pt c/o medication issue:  1. Name of Medication: Rosuvastatin and Digoxin   2. How are you currently taking this medication (dosage and times per day)?   3. Are you having a reaction (difficulty breathing--STAT)?   4. What is your medication issue? Patient says she have questions about these medicine

## 2024-01-01 NOTE — Telephone Encounter (Signed)
 Called patient to inform her of Jimmy Footman recommendation below:  "It is not likely that it is related to her rosuvastatin. She is on a very low dose.  However, it is reasonable to stop her rosuvastatin x 2 weeks to see if her symptoms improve.  Have her let us know via MyChart or by calling in, if her symptoms improve after she stops x 2 weeks."   Patient verbalized understanding and had no further questions at this time.

## 2024-01-01 NOTE — Telephone Encounter (Signed)
 Called patient and she reported that she had been getting massage therapy on her arm and shoulder and it has become very sore and achy. She was going through her paperwork and came across a letter she received last August from her insurance company which stated that if she was taking Rosuvastatin that she should watch for severe muscle aches, pain or weakness or dark colored urine because she may be on too much of the medicine. She is calling to find out if she is on too much rosuvastatin and if she needs to be on a lower prescription. She states that she is continuing to take her Digoxin.

## 2024-01-02 ENCOUNTER — Other Ambulatory Visit (HOSPITAL_COMMUNITY): Payer: Self-pay

## 2024-01-02 MED ORDER — DIGOXIN 125 MCG PO TABS
0.0625 mg | ORAL_TABLET | Freq: Every day | ORAL | 3 refills | Status: DC
  Filled 2024-01-02: qty 45, 90d supply, fill #0

## 2024-01-02 NOTE — Telephone Encounter (Signed)
 Sent in corrected dose of Digoxin: 0.125mg /take 1/2 tablet daily (0.0625mg ), sent to pharmacy

## 2024-01-05 ENCOUNTER — Other Ambulatory Visit: Payer: Self-pay | Admitting: Cardiology

## 2024-01-12 ENCOUNTER — Other Ambulatory Visit (HOSPITAL_COMMUNITY): Payer: Self-pay

## 2024-01-22 ENCOUNTER — Telehealth: Payer: Self-pay | Admitting: Internal Medicine

## 2024-01-22 NOTE — Telephone Encounter (Signed)
 Pacemaker reports appears normal function. Presenting rhythm AF/VS 80-130's.  AV mode switch 99.3% (consistent with previous measurements).   Routing to Dr. Ladona Ridgel to advise further.

## 2024-01-22 NOTE — Telephone Encounter (Signed)
 STAT if HR is under 50 or over 120 (normal HR is 60-100 beats per minute)  What is your heart rate?  She had these readings 114, 106,125, 117 and Friday it was 144  Do you have a log of your heart rate readings (document readings)?   Do you have any other symptoms? Feels like her heart is quivering

## 2024-01-22 NOTE — Telephone Encounter (Signed)
 Spoke with the patient who reports that she has been having elevated heart rates for the past week. She states that it has gotten up to 130-140s when she is up and moving around. She does report some shortness of breath with exertion as well. She reports that at rest her heart rate has been 90-110. She state that she is taking digoxin and metoprolol as prescribed. She states that she was a bit more anxious over the weekend. She is going to send in a transmission from her device for review.

## 2024-01-23 LAB — CBC WITH DIFFERENTIAL/PLATELET
Basophils Absolute: 0 x10E3/uL (ref 0.0–0.2)
Basos: 0 %
EOS (ABSOLUTE): 0.1 x10E3/uL (ref 0.0–0.4)
Eos: 1 %
Hematocrit: 44 % (ref 34.0–46.6)
Hemoglobin: 14.1 g/dL (ref 11.1–15.9)
Immature Grans (Abs): 0 x10E3/uL (ref 0.0–0.1)
Immature Granulocytes: 0 %
Lymphocytes Absolute: 2.4 x10E3/uL (ref 0.7–3.1)
Lymphs: 33 %
MCH: 30.8 pg (ref 26.6–33.0)
MCHC: 32 g/dL (ref 31.5–35.7)
MCV: 96 fL (ref 79–97)
Monocytes Absolute: 0.5 x10E3/uL (ref 0.1–0.9)
Monocytes: 7 %
Neutrophils Absolute: 4.3 x10E3/uL (ref 1.4–7.0)
Neutrophils: 59 %
Platelets: 286 x10E3/uL (ref 150–450)
RBC: 4.58 x10E6/uL (ref 3.77–5.28)
RDW: 12.2 % (ref 11.7–15.4)
WBC: 7.3 x10E3/uL (ref 3.4–10.8)

## 2024-01-23 LAB — DIGOXIN LEVEL: Digoxin, Serum: 0.5 ng/mL (ref 0.5–0.9)

## 2024-01-23 NOTE — Telephone Encounter (Signed)
 No change

## 2024-01-24 NOTE — Telephone Encounter (Signed)
 Patient having continued symptoms with AF/RVR events.  SOB with exertion, palpitations and increased fatigued.  Due to increase in symptoms, ? Need to consider rate control med adjustments.  Histograms below show significant elevation in V rates.   Dr. Ladona Ridgel is out of the office indefinitely at the present to re-evaluate. Patient is in agreement with AF clinic to assess rate/symptom control for patient. Will send them request for referral.

## 2024-01-24 NOTE — Telephone Encounter (Signed)
 See phone encounter that addresses.

## 2024-01-24 NOTE — Telephone Encounter (Signed)
 The pt would like for the nurse to give her a call back.

## 2024-01-25 ENCOUNTER — Ambulatory Visit (HOSPITAL_COMMUNITY)
Admission: RE | Admit: 2024-01-25 | Discharge: 2024-01-25 | Disposition: A | Discharge status: Home / Self Care | Source: Ambulatory Visit | Attending: Internal Medicine | Admitting: Internal Medicine

## 2024-01-25 VITALS — BP 130/70 | HR 94 | Ht 66.0 in | Wt 153.4 lb

## 2024-01-25 DIAGNOSIS — Z950 Presence of cardiac pacemaker: Secondary | ICD-10-CM | POA: Insufficient documentation

## 2024-01-25 DIAGNOSIS — D6869 Other thrombophilia: Secondary | ICD-10-CM

## 2024-01-25 DIAGNOSIS — E039 Hypothyroidism, unspecified: Secondary | ICD-10-CM | POA: Diagnosis not present

## 2024-01-25 DIAGNOSIS — I10 Essential (primary) hypertension: Secondary | ICD-10-CM | POA: Insufficient documentation

## 2024-01-25 DIAGNOSIS — I495 Sick sinus syndrome: Secondary | ICD-10-CM | POA: Diagnosis not present

## 2024-01-25 DIAGNOSIS — I4819 Other persistent atrial fibrillation: Secondary | ICD-10-CM

## 2024-01-25 DIAGNOSIS — Z7901 Long term (current) use of anticoagulants: Secondary | ICD-10-CM | POA: Diagnosis not present

## 2024-01-25 DIAGNOSIS — Z79899 Other long term (current) drug therapy: Secondary | ICD-10-CM | POA: Insufficient documentation

## 2024-01-25 DIAGNOSIS — R9431 Abnormal electrocardiogram [ECG] [EKG]: Secondary | ICD-10-CM | POA: Diagnosis not present

## 2024-01-25 HISTORY — DX: Other persistent atrial fibrillation: I48.19

## 2024-01-25 MED ORDER — METOPROLOL SUCCINATE ER 25 MG PO TB24: 37.5000 mg | ORAL_TABLET | Freq: Every day | ORAL | 2 refills | Status: DC

## 2024-01-25 NOTE — Patient Instructions (Signed)
 Increase metoprolol to 1 and 1/2 tablets daily to total 37.5mg  once a day

## 2024-01-25 NOTE — Progress Notes (Signed)
 Primary Care Physician: Hadley Pen, MD Primary Cardiologist: Norman Herrlich, MD Electrophysiologist: Lewayne Bunting, MD     Referring Physician: Device clinic     April Padilla is a 78 y.o. female with a history of HTN, SSS s/p PPM, hypothyroidism, SVT, and atrial fibrillation who presents for consultation in the [facility].  Extensive history of ablation in 2016 and 2017 with attempt at rhythm control with flecainide, propafenone, Multaq, Tikosyn; refused amiodarone in the past. Appears to be rate control strategy at this time. Started on low dose digoxin by Dr. Ladona Ridgel 06/28/23 to help control VR better. Patient called device clinic on 01/22/24 noting higher HR with symptoms of SOB, palpitations, and fatigue. Patient is on Eliquis 5 mg BID for a CHADS2VASC score of 4.  On evaluation today, she is currently in rate controlled Afib. She notes her HR will fluctuate at times making her more symptomatic as noted above. She missed a dose of Eliquis two days ago.   Today, she denies symptoms of chest pain, orthopnea, PND, lower extremity edema, dizziness, presyncope, syncope, snoring, daytime somnolence, bleeding, or neurologic sequela. The patient is tolerating medications without difficulties and is otherwise without complaint today.    she has a BMI of Body mass index is 24.76 kg/m.Marland Kitchen Filed Weights   01/25/24 1434  Weight: 69.6 kg    Current Outpatient Medications  Medication Sig Dispense Refill   ALPRAZolam (XANAX) 0.5 MG tablet Take 0.5 mg by mouth daily as needed for anxiety or sleep.     cholecalciferol (VITAMIN D3) 25 MCG (1000 UNIT) tablet Take 2,000 Units by mouth daily.     digoxin (LANOXIN) 0.125 MG tablet Take 1/2 tablet (0.0625 mg total) by mouth daily. 45 tablet 3   ELIQUIS 5 MG TABS tablet TAKE 1 TABLET BY MOUTH TWICE A DAY 180 tablet 3   famotidine (PEPCID) 20 MG tablet Take 1 tablet by mouth in the morning and at bedtime.     furosemide  (LASIX) 20 MG tablet Take 1 tablet (20 mg total) by mouth as needed for fluid or edema. 90 tablet 3   levothyroxine (SYNTHROID) 112 MCG tablet Take 1 tablet (112 mcg total) by mouth daily before breakfast. 90 tablet 1   magnesium oxide (MAG-OX) 400 MG tablet Take 400 mg by mouth daily.     verapamil (CALAN-SR) 180 MG CR tablet Take 0.5 tablets (90 mg total) by mouth daily. 45 tablet 3   metoprolol succinate (TOPROL-XL) 25 MG 24 hr tablet Take 1.5 tablets (37.5 mg total) by mouth daily. 120 tablet 2   rosuvastatin (CRESTOR) 5 MG tablet Take 5 mg by mouth daily. (Patient not taking: Reported on 01/25/2024)     No current facility-administered medications for this encounter.    Atrial Fibrillation Management history:  Previous antiarrhythmic drugs: multiple Previous cardioversions: none Previous ablations: 2016, 2017 Anticoagulation history: Eliquis 5 mg BID   ROS- All systems are reviewed and negative except as per the HPI above.  Physical Exam: BP 130/70   Pulse 94   Ht 5\' 6"  (1.676 m)   Wt 69.6 kg   BMI 24.76 kg/m   GEN: Well nourished, well developed in no acute distress NECK: No JVD; No carotid bruits CARDIAC: Irregularly irregular rate and rhythm, no murmurs, rubs, gallops RESPIRATORY:  Clear to auscultation without rales, wheezing or rhonchi  ABDOMEN: Soft, non-tender, non-distended EXTREMITIES:  No edema; No deformity   EKG today demonstrates  Vent. rate 94 BPM PR  interval * ms QRS duration 90 ms QT/QTcB 344/430 ms P-R-T axes * 44 161 Atrial fibrillation Low voltage QRS Septal infarct , age undetermined Abnormal ECG When compared with ECG of 13-Nov-2023 14:13, PREVIOUS ECG IS PRESENT  Echo 11/11/20 demonstrated  1. Left ventricular ejection fraction, by estimation, is 65 to 70%. The  left ventricle has normal function. The left ventricle has no regional  wall motion abnormalities. There is mild concentric left ventricular  hypertrophy. Diastolic function   indeterminant due to atrial fibrillation.   2. Right ventricular systolic function is normal. The right ventricular  size is moderately enlarged. There is mildly elevated pulmonary artery  systolic pressure. The estimated right ventricular systolic pressure is  41.4 mmHg.   3. Left atrial size was moderately dilated.   4. Right atrial size was severely dilated.   5. The mitral valve is normal in structure. Mild mitral valve  regurgitation.   6. The aortic valve is tricuspid. There is mild thickening of the aortic  valve. Aortic valve regurgitation is not visualized. No aortic stenosis is  present.   7. The inferior vena cava is normal in size with <50% respiratory  variability, suggesting right atrial pressure of 8 mmHg.    ASSESSMENT & PLAN CHA2DS2-VASc Score = 4  The patient's score is based upon: CHF History: 0 HTN History: 1 Diabetes History: 0 Stroke History: 0 Vascular Disease History: 0 Age Score: 2 Gender Score: 1       ASSESSMENT AND PLAN: Persistent Atrial Fibrillation (ICD10:  I48.19) The patient's CHA2DS2-VASc score is 4, indicating a 4.8% annual risk of stroke.    She is in rate controlled Afib. We discussed rate control adjustment options today. After discussion, will increase Toprol to 37.5 mg daily and have patient monitor BP.   Secondary Hypercoagulable State (ICD10:  D68.69) The patient is at significant risk for stroke/thromboembolism based upon her CHA2DS2-VASc Score of 4.  Continue Apixaban (Eliquis).  Continue Eliquis 5 mg BID without interruption.     Follow up for recall in August with Dr. Ladona Ridgel.    Lake Bells, PA-C  Afib Clinic [facility] [address] [place], [place] [address] [phone]

## 2024-02-08 ENCOUNTER — Ambulatory Visit (INDEPENDENT_AMBULATORY_CARE_PROVIDER_SITE_OTHER): Payer: Medicare Other

## 2024-02-08 DIAGNOSIS — I495 Sick sinus syndrome: Secondary | ICD-10-CM | POA: Diagnosis not present

## 2024-02-09 LAB — CUP PACEART REMOTE DEVICE CHECK
Battery Remaining Longevity: 96 mo
Battery Voltage: 2.98 V
Brady Statistic AS VP Percent: 0.09 %
Brady Statistic AS VS Percent: 0.01 %
Brady Statistic RV Percent Paced: 0.27 %
Date Time Interrogation Session: 20250410180413
Implantable Pulse Generator Implant Date: 20200210
Lead Channel Impedance Value: 620 Ohm
Lead Channel Pacing Threshold Amplitude: 0.625 V
Lead Channel Pacing Threshold Pulse Width: 0.24 ms
Lead Channel Sensing Intrinsic Amplitude: 26.888 mV
Lead Channel Setting Pacing Amplitude: 1.5 V
Lead Channel Setting Pacing Pulse Width: 0.24 ms
Lead Channel Setting Sensing Sensitivity: 2 mV

## 2024-02-13 ENCOUNTER — Encounter: Payer: Self-pay | Admitting: Internal Medicine

## 2024-03-02 ENCOUNTER — Other Ambulatory Visit: Payer: Self-pay | Admitting: Cardiology

## 2024-03-22 NOTE — Addendum Note (Signed)
 Addended by: Lott Rouleau A on: 03/22/2024 09:57 AM   Modules accepted: Orders

## 2024-03-22 NOTE — Progress Notes (Signed)
 Remote pacemaker transmission.

## 2024-03-23 ENCOUNTER — Other Ambulatory Visit: Payer: Self-pay | Admitting: Endocrinology

## 2024-03-23 DIAGNOSIS — E890 Postprocedural hypothyroidism: Secondary | ICD-10-CM

## 2024-03-26 NOTE — Telephone Encounter (Signed)
 Refill request complete

## 2024-04-16 ENCOUNTER — Ambulatory Visit: Payer: Medicare Other | Admitting: Endocrinology

## 2024-05-06 ENCOUNTER — Other Ambulatory Visit: Payer: Self-pay | Admitting: Cardiology

## 2024-05-07 NOTE — Telephone Encounter (Signed)
 Prescription refill request for Eliquis received. Indication: AF Last office visit: 01/25/24  JINNY Heinrich PA-C Scr: 0.89 on 03/11/24  Epic Age: 78 Weight: 69.6kg  Based on above findings Eliquis 5mg  twice daily is the appropriate dose.  Refill approved.

## 2024-05-09 ENCOUNTER — Ambulatory Visit: Payer: Medicare Other

## 2024-05-09 DIAGNOSIS — I495 Sick sinus syndrome: Secondary | ICD-10-CM

## 2024-05-10 LAB — CUP PACEART REMOTE DEVICE CHECK
Battery Remaining Longevity: 96 mo
Battery Voltage: 2.98 V
Brady Statistic AS VP Percent: 0.09 %
Brady Statistic AS VS Percent: 0.01 %
Brady Statistic RV Percent Paced: 0.31 %
Date Time Interrogation Session: 20250709114313
Implantable Pulse Generator Implant Date: 20200210
Lead Channel Impedance Value: 640 Ohm
Lead Channel Pacing Threshold Amplitude: 0.625 V
Lead Channel Pacing Threshold Pulse Width: 0.24 ms
Lead Channel Sensing Intrinsic Amplitude: 27.338 mV
Lead Channel Setting Pacing Amplitude: 1.5 V
Lead Channel Setting Pacing Pulse Width: 0.24 ms
Lead Channel Setting Sensing Sensitivity: 2 mV

## 2024-05-13 ENCOUNTER — Telehealth: Payer: Self-pay | Admitting: Cardiology

## 2024-05-13 ENCOUNTER — Ambulatory Visit: Payer: Self-pay | Admitting: Internal Medicine

## 2024-05-13 NOTE — Telephone Encounter (Signed)
 Pt c/o BP issue: STAT if pt c/o blurred vision, one-sided weakness or slurred speech.  STAT if BP is GREATER than 180/120 TODAY.  STAT if BP is LESS than 90/60 and SYMPTOMATIC TODAY  1. What is your BP concern? Low BP  2. Have you taken any BP medication today?no not yet  3. What are your last 5 BP readings?107/62 78, 110/55 71, 105/57  56, 104/58 57 , 90/54 62. 99/54 64  4. Are you having any other symptoms (ex. Dizziness, headache, blurred vision, passed out)? Legs aching, headache, fatigue

## 2024-05-13 NOTE — Telephone Encounter (Signed)
 Called the patient and she reported that her legs were aching, she was having headaches and fatigue. She also stated that her blood pressures and heart rates had been running low:  107/62 78, 110/55 71, 105/57  56, 104/58 57 , 90/54 62. 99/54 64  Patient has not taken her Verapamil  today because she is concerned about her low blood pressures. Please advise.

## 2024-05-14 ENCOUNTER — Telehealth: Payer: Self-pay | Admitting: Endocrinology

## 2024-05-14 DIAGNOSIS — M810 Age-related osteoporosis without current pathological fracture: Secondary | ICD-10-CM

## 2024-05-14 DIAGNOSIS — E890 Postprocedural hypothyroidism: Secondary | ICD-10-CM

## 2024-05-14 NOTE — Telephone Encounter (Signed)
 Called the patient and informed her of Dr. Madireddy's recommendation below:  I agree, Low blood pressures, discontinue verapamil . Thank you  Patient verbalized understanding and was concerned because she was still having low blood pressures. Patient was advised to keep a log of her blood pressure and pulse until her appointment with Dr. Liborio on 7/25 and to bring her log to that appointment, for Dr. Liborio to evaluate. Patient had no further questions at this time.

## 2024-05-14 NOTE — Telephone Encounter (Signed)
 Patient is requesting a referral be sent to [facility] - Dr Faythe.  She is changing providers.  Fax # [phone]

## 2024-05-22 ENCOUNTER — Ambulatory Visit: Admitting: Cardiology

## 2024-05-24 ENCOUNTER — Ambulatory Visit

## 2024-05-24 VITALS — BP 122/70 | HR 88 | Ht 66.0 in | Wt 154.2 lb

## 2024-05-24 DIAGNOSIS — E782 Mixed hyperlipidemia: Secondary | ICD-10-CM | POA: Insufficient documentation

## 2024-05-24 DIAGNOSIS — I495 Sick sinus syndrome: Secondary | ICD-10-CM | POA: Insufficient documentation

## 2024-05-24 DIAGNOSIS — I482 Chronic atrial fibrillation, unspecified: Secondary | ICD-10-CM | POA: Diagnosis present

## 2024-05-24 MED ORDER — METOPROLOL TARTRATE 25 MG PO TABS: 25.0000 mg | ORAL_TABLET | Freq: Two times a day (BID) | ORAL | 3 refills | Status: DC

## 2024-05-24 NOTE — Assessment & Plan Note (Signed)
 Continue with rate control strategy. Discontinue metoprolol  succinate and verapamil . Start metoprolol  tartrate 25 mg twice daily and recommended she can further titrate up the dose to 3 times daily if heart rates remain elevated. She denies any depression symptoms.  For stroke prophylaxis continue with Eliquis 5 mg twice daily, no indication to reduce the dose. Tolerating well

## 2024-05-24 NOTE — Assessment & Plan Note (Signed)
 S/p pacemaker implanted in 2020, normal functioning device.

## 2024-05-24 NOTE — Patient Instructions (Signed)
 Medication Instructions:  Your physician has recommended you make the following change in your medication:  Stop Verapamil  Stop Metoprolol  XL Start Metoprolol  Tartrate (Lopressor ) 25 mg two times daily  *If you need a refill on your cardiac medications before your next appointment, please call your pharmacy*  Lab Work: NONE If you have labs (blood work) drawn today and your tests are completely normal, you will receive your results only by: MyChart Message (if you have MyChart) OR A paper copy in the mail If you have any lab test that is abnormal or we need to change your treatment, we will call you to review the results.  Testing/Procedures: NONE  Follow-Up: At [facility], you and your health needs are our priority.  As part of our continuing mission to provide you with exceptional heart care, our providers are all part of one team.  This team includes your primary Cardiologist (physician) and Advanced Practice Providers or APPs (Physician Assistants and Nurse Practitioners) who all work together to provide you with the care you need, when you need it.  Your next appointment:   4 month(s)  Provider:   Alean Kobus, MD    We recommend signing up for the patient portal called MyChart.  Sign up information is provided on this After Visit Summary.  MyChart is used to connect with patients for Virtual Visits (Telemedicine).  Patients are able to view lab/test results, encounter notes, upcoming appointments, etc.  Non-urgent messages can be sent to your provider as well.   To learn more about what you can do with MyChart, go to [email or web address].   Other Instructions

## 2024-05-24 NOTE — Progress Notes (Signed)
 Cardiology Consultation:    Date:  05/24/2024   ID:  April, Padilla 11-25-1945, MRN [id number]  PCP:  Silver Lamar LABOR, MD  Cardiologist:  Alean SAUNDERS Taylan Mayhan, MD   Referring MD: Silver Lamar LABOR, MD   No chief complaint on file.    ASSESSMENT AND PLAN:   [name] 78 year old woman history of SSS s/p permanent pacemaker [Medtronic Micra in 2020], permanent atrial fibrillation [failed rhythm control, prior antiarrhythmics and ablation] remains on rate control strategy, SVT, hypertension, hypothyroidism, hyperparathyroidism, hyperlipidemia. Echocardiogram January 2022 reported LVEF 65 to 70%, moderately dilated RV with RVSP 41 mmHg and normal function, dilated right and left atria, mild MR.  Appears to have chronic fatigue and tiredness symptoms and attributes to elevated heart rates and somewhat low blood pressures. Did not tolerate weaning off verapamil  due to elevated heart rates.  Problem List Items Addressed This Visit     Chronic atrial fibrillation (HCC) - Primary   Continue with rate control strategy. Discontinue metoprolol  succinate and verapamil . Start metoprolol  tartrate 25 mg twice daily and recommended she can further titrate up the dose to 3 times daily if heart rates remain elevated. She denies any depression symptoms.  For stroke prophylaxis continue with Eliquis 5 mg twice daily, no indication to reduce the dose. Tolerating well      Relevant Medications   Digoxin  62.5 MCG TABS   metoprolol  tartrate (LOPRESSOR ) 25 MG tablet   Mixed hyperlipidemia   Continue current treatment with rosuvastatin 5 mg once daily. 03/11/2024 lipid panel total cholesterol 206, triglycerides 154, HDL 76 and LDL 103.      Relevant Medications   Digoxin  62.5 MCG TABS   metoprolol  tartrate (LOPRESSOR ) 25 MG tablet   Sick sinus syndrome (HCC)   S/p pacemaker implanted in 2020, normal functioning device.       Relevant Medications   Digoxin  62.5 MCG TABS    metoprolol  tartrate (LOPRESSOR ) 25 MG tablet   Return to clinic tentatively in 4 months.  If continues to have persistent symptoms despite good rate control will consider further evaluation with echocardiogram and stress test.   History of Present Illness:    April Padilla is a 78 y.o. female who is being seen today for follow-up visit. PCP is Silver Lamar LABOR, MD. Last visit at our office was with Delon Hoover, NP-C 11-30-2023. Also following up with A-fib clinic, last visit 01/25/2024 with [name], PA-C.  Has history of SSS s/p permanent pacemaker [Medtronic Micra in 2020], permanent atrial fibrillation [failed rhythm control, prior antiarrhythmics and ablation] remains on rate control strategy, SVT, hypertension, hypothyroidism, hyperparathyroidism, hyperlipidemia.  Last device check 11/11/2023 with normal battery and lead function parameters, minimal ventricular pacing.  Appears to be relatively tachycardic over 20% of the time   Here for the visit today by herself.  Mentions at home lives by herself and manages her own medications. Mentions she continues to feel poorly as she feels tired and lack of energy. Mentions that she gets up to walk she does report to be feeling poorly. Denies any chest pain, shortness of breath, orthopnea, paroxysmal nocturnal dyspnea. Denies any awareness of palpitations. Denies any syncopal episodes but does get lightheaded at times with changing position.  Denies any blood in urine or stools.  With low blood pressures couple weeks ago she reached out to the office and we recommended discontinuing verapamil  and she held the medication for couple days but felt her blood pressures improved but her heart rates also  increased and she resumed taking verapamil  at one quarter of her 180 mg dose.  This she feels helped with the heart rate but again started lowering her blood pressure significantly.  Here today hemodynamically stable, heart rates appear  well-controlled.  She also describes epicardial discomfort and gastroesophageal reflux symptoms with heartburn worse after eating food.  She is pending evaluation with gastroenterologist.  Home log of her blood pressures and heart rate readings reviewed.   Past Medical History:  Diagnosis Date   Bradycardia 02/07/2022   Cardiac pacemaker 12/11/2018   Formatting of this note might be different from the original.  Medtronic [place], [place], [place]: [id number], DOI: 12/10/2018  Formatting of this note might be different from the original.  Medtronic Micra AV, [id number] leadless pacemaker. S/N: [id number], implanted 12/10/2018.     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   Chest discomfort 12/23/2018   Chronic atrial fibrillation (HCC) 01/26/2016   Last Assessment & Plan:  Has had ablation through [facility] , her BP here was stable , discussed her current meds and the S/E , she is off of the lasix  now and will see her CMP for her as well as CK today for her muscle cramps she has had long before her ablation with possible pain from the statin   Chronic fatigue 07/25/2016   Chronic idiopathic constipation 01/26/2016   Chronic pain of right knee 10/27/2020   Dysrhythmia    on toprol  for fast heart beat   Elevated glucose 05/12/2021   Encounter for care of pacemaker 12/19/2018   Esophageal stricture 01/26/2016   Essential (primary) hypertension 01/26/2016   Family history of colon cancer in mother 07/17/2017   GERD (gastroesophageal reflux disease) 07/17/2017   H/O hiatal hernia    High risk medication use 01/26/2016   History of adenomatous polyp of colon 07/17/2017   Hypercalcemia 01/26/2016   Hypercoagulable state due to persistent atrial fibrillation (HCC) 01/25/2024   Hypertensive heart disease 06/02/2020   Hypothyroidism    pt. states she took radioactive pill and has no thyroid    Irregular heart rate 07/06/2018   Left shoulder pain 07/06/2018   Localized  swelling of right lower extremity 08/26/2021   Long term current use of anticoagulant 07/06/2018   Mixed hyperlipidemia 01/26/2016   Myalgia 05/18/2016   Last Assessment & Plan:  Formatting of this note might be different from the original. 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  04/2016     Thyroid  function test abnormal 08/19/2016  Trigger finger of right thumb 11/01/2023   Trigger middle finger of left hand 11/01/2023   Vitamin D  deficiency 11/26/2017   Wrist arthritis 11/01/2023    Past Surgical History:  Procedure Laterality Date   BUNIONECTOMY  yrs. ago   left foot   FOOT SURGERY  2009   took knot out  and put in screws   KNEE ARTHROSCOPY W/ MENISCAL REPAIR  2009   left   TOTAL KNEE ARTHROPLASTY  01/16/2012   Procedure: TOTAL KNEE ARTHROPLASTY;  Surgeon: Garnette JONETTA Raman, MD;  Location: [facility] OR;  Service: Orthopedics;  Laterality:  Left;    Current Medications: Current Meds  Medication Sig   ALPRAZolam (XANAX) 0.5 MG tablet Take 0.5 mg by mouth daily as needed for anxiety or sleep.   apixaban (ELIQUIS) 5 MG TABS tablet TAKE 1 TABLET BY MOUTH TWICE A DAY   cholecalciferol (VITAMIN D3) 25 MCG (1000 UNIT) tablet Take 2,000 Units by mouth daily.   Digoxin  62.5 MCG TABS Take 62.5 mg by mouth daily.   famotidine  (PEPCID ) 20 MG tablet Take 1 tablet by mouth in the morning and at bedtime.   furosemide  (LASIX ) 20 MG tablet TAKE 1 TABLET (20 MG TOTAL) BY MOUTH AS NEEDED FOR FLUID OR EDEMA.   levothyroxine  (SYNTHROID ) 100 MCG tablet Take 100 mcg by mouth daily.   metoprolol  tartrate (LOPRESSOR ) 25 MG tablet Take 1 tablet (25 mg total) by mouth 2 (two) times daily.   pantoprazole  (PROTONIX ) 40 MG tablet Take 40 mg by mouth daily.   rosuvastatin (CRESTOR) 5 MG tablet Take 5 mg by mouth daily.   [DISCONTINUED] metoprolol  succinate (TOPROL -XL) 25 MG 24 hr tablet Take 1.5 tablets (37.5 mg total) by mouth daily.   [DISCONTINUED] verapamil  (CALAN -SR) 180 MG CR tablet Take 0.5 tablets (90 mg total) by mouth daily. (Patient taking differently: Take 90 mg by mouth daily. Taking 1/4 tabley by mouth daily.)     Allergies:   Clindamycin/lincomycin, Nitrofurantoin monohyd macro, and Silver sulfadiazine   Social History   Socioeconomic History   Marital status: Married    Spouse name: Not on file   Number of children: Not on file   Years of education: Not on file   Highest education level: Not on file  Occupational History   Not on file  Tobacco Use   Smoking status: Never   Smokeless tobacco: Never  Substance and Sexual Activity   Alcohol use: No    Comment: occasional   Drug use: No   Sexual activity: Yes  Other Topics Concern   Not on file  Social History Narrative   Not on file   Social Drivers of Health   Financial Resource Strain: Not on file  Food Insecurity: Not on file  Transportation Needs: Not on file   Physical Activity: Insufficiently Active (11/24/2017)   Exercise Vital Sign    Days of Exercise per Week: 2 days    Minutes of Exercise per Session: 20 min  Stress: Not on file  Social Connections: Not on file     Family History: The patient's family history includes Cancer in her mother; Stroke in her father; Thyroid  disease in her mother. There is no history of Anesthesia problems, Hypotension, Malignant hyperthermia, Pseudochol deficiency, or Sleep apnea. ROS:   Please see the history of present illness.    All 14 point review of systems negative except as described per history of present illness.  EKGs/Labs/Other Studies Reviewed:    The following studies were reviewed today:  EKG:       Recent Labs: 10/11/2023: BUN 15; Creat 0.90; Potassium 4.5; Sodium 141; TSH 1.95 01/22/2024: Hemoglobin 14.1; Platelets 286  Recent Lipid Panel    Component Value Date/Time   CHOL 179 12/28/2021 1120   TRIG 143 12/28/2021 1120   HDL 87 12/28/2021 1120   CHOLHDL 2.1 12/28/2021 1120   LDLCALC 68 12/28/2021 1120    Physical Exam:    VS:  BP 122/70   Pulse 88   Ht 5' 6 (1.676 m)   Wt 154 lb 3.2 oz (69.9 kg)   SpO2 95%   BMI 24.89 kg/m     Wt Readings from Last 3 Encounters:  05/24/24 154 lb 3.2 oz (69.9 kg)  01/25/24 153 lb 6.4 oz (69.6 kg)  11/13/23 156 lb (70.8 kg)     GENERAL:  Well nourished, well developed in no acute distress NECK: No JVD; No carotid bruits CARDIAC: RRR, S1 and S2 present, no murmurs, no rubs, no gallops CHEST:  Clear to auscultation without rales, wheezing or rhonchi  Extremities: No pitting pedal edema. Pulses bilaterally symmetric with radial 2+ and dorsalis pedis 2+.  Has superficial varicose veins. NEUROLOGIC:  Alert and oriented x 3  Medication Adjustments/Labs and Tests Ordered: Current medicines are reviewed at length with the patient today.  Concerns regarding medicines are outlined above.  No orders of the defined types were placed in this  encounter.  Meds ordered this encounter  Medications   metoprolol  tartrate (LOPRESSOR ) 25 MG tablet    Sig: Take 1 tablet (25 mg total) by mouth 2 (two) times daily.    Dispense:  180 tablet    Refill:  3    Signed, Peityn Payton reddy Jaimeson Gopal, MD, MPH, [facility]. 05/24/2024 1:35 PM    [facility]

## 2024-05-24 NOTE — Assessment & Plan Note (Addendum)
 Continue current treatment with rosuvastatin 5 mg once daily. 03/11/2024 lipid panel total cholesterol 206, triglycerides 154, HDL 76 and LDL 103.

## 2024-05-31 ENCOUNTER — Telehealth: Payer: Self-pay

## 2024-05-31 ENCOUNTER — Other Ambulatory Visit: Payer: Self-pay

## 2024-05-31 NOTE — Telephone Encounter (Signed)
 Pt sent in transmission

## 2024-05-31 NOTE — Telephone Encounter (Signed)
 Called the patient and she reported that she was recently changed from Verapamil  to Metoprolol  tartrate during her last visit. The medication was prescribed for two times a day but she told by Dr. Liborio that she could take a third pill if needed during middle of the day. She has been taking the Metoprolol  three times per day and her blood pressure has been running low:  86/59 97/60 99/60  97/63  But when she takes the medication two times a day her heart rate goes fast: 120's - 130's and she becomes short of breath. Please advise.

## 2024-05-31 NOTE — Telephone Encounter (Signed)
 Transmission received.  See histograms below:

## 2024-05-31 NOTE — Telephone Encounter (Signed)
 Called the patient and informed her of Dr. Karry recommendation below:  Please have her pacemaker interrogated remotely to see what the problem is  Patient verbalized understanding and had no further questions at this time.

## 2024-05-31 NOTE — Telephone Encounter (Signed)
 Patient says at her last visit we had changed her medication- if she takes it 3x her bp goes too low, and if you only take 2x her heartrate is too high  (128-130 bpm) and gets shob  Patient states we changed her verapamil  to metoprolol .    Please advise  Best number [phone]

## 2024-06-03 ENCOUNTER — Other Ambulatory Visit: Payer: Self-pay

## 2024-06-03 MED ORDER — METOPROLOL TARTRATE 25 MG PO TABS: 12.5000 mg | ORAL_TABLET | Freq: Three times a day (TID) | ORAL | 3 refills | Status: AC

## 2024-06-03 NOTE — Telephone Encounter (Signed)
 Patient called to check status of referral to [facility] - Dr Faythe . Per patient April Padilla is advising that they have not received anything yet.

## 2024-06-03 NOTE — Addendum Note (Signed)
 Addended by: Raequon Catanzaro, IRA[place] on: 06/03/2024 01:40 PM   Modules accepted: Orders

## 2024-06-03 NOTE — Telephone Encounter (Signed)
 Okay to send referral as patient requested.

## 2024-06-03 NOTE — Telephone Encounter (Signed)
 Referral placed.

## 2024-06-03 NOTE — Telephone Encounter (Signed)
 Called the patient and informed her of Dr. Madireddy's recommendation below:  Please have her switch metoprolol  tartrate to 12.5 mg 3 times daily.  Thank you.  Patient verbalized understanding and had no further questions at this time. Metoprolol  tartrate medication ordered via Epic and sent to her pharmacy.

## 2024-06-04 ENCOUNTER — Ambulatory Visit: Admitting: Endocrinology

## 2024-06-04 ENCOUNTER — Other Ambulatory Visit: Payer: Self-pay

## 2024-06-04 NOTE — Telephone Encounter (Signed)
 Patient already taking Metoprolol  tartrate 12.5 mg three times daily per Dr. Madireddy's note.

## 2024-06-07 DIAGNOSIS — I110 Hypertensive heart disease with heart failure: Secondary | ICD-10-CM

## 2024-06-07 DIAGNOSIS — I482 Chronic atrial fibrillation, unspecified: Secondary | ICD-10-CM

## 2024-06-26 ENCOUNTER — Ambulatory Visit: Attending: Cardiovascular Disease | Admitting: Internal Medicine

## 2024-06-26 ENCOUNTER — Encounter: Payer: Self-pay | Admitting: Internal Medicine

## 2024-06-26 VITALS — BP 126/66 | HR 98 | Ht 66.0 in | Wt 155.7 lb

## 2024-06-26 DIAGNOSIS — I482 Chronic atrial fibrillation, unspecified: Secondary | ICD-10-CM | POA: Insufficient documentation

## 2024-06-26 DIAGNOSIS — Z79899 Other long term (current) drug therapy: Secondary | ICD-10-CM | POA: Insufficient documentation

## 2024-06-26 LAB — CUP PACEART INCLINIC DEVICE CHECK
Date Time Interrogation Session: 20250827144346
Implantable Pulse Generator Implant Date: 20200210

## 2024-06-26 MED ORDER — DIGOXIN 62.5 MCG PO TABS: 125.0000 ug | ORAL_TABLET | Freq: Every day | ORAL | 3 refills | Status: AC

## 2024-06-26 NOTE — Progress Notes (Signed)
 HPI April Padilla returns for ongoing evaluation and managemen of atrial fib. She is a pleasant 78 yo woman with a long h/o atrial fib. She has undergone 2 catheter ablations and has in the past been treated with dofetilide. She appears to have initially undergone cryoablation in 2016, and then was treated with flecainide, propafenone, Multaq and popafenone. She had repeat PVI at [facility] in 2017. She developed symptomatic bradycardia and underwent Micra insertion. She has continued to have atrial fib. She notes dyspnea with exertion. She also notes some fatigue.  She has been offered amiodarone in the past but has refused this medication. She is currently being treated with a strategy of rate control with both metoprolol  and digoxin . She has minimal palpitations but she gets tired quickly. She feels palpitations and her VR is not well controlled based on her log of HR's and BP's. Her PPM confirms this.  Allergies  Allergen Reactions   Clindamycin/Lincomycin Rash   Nitrofurantoin Monohyd Macro Rash   Silver Sulfadiazine Rash     Current Outpatient Medications  Medication Sig Dispense Refill   ALPRAZolam (XANAX) 0.5 MG tablet Take 0.5 mg by mouth daily as needed for anxiety or sleep.     apixaban (ELIQUIS) 5 MG TABS tablet TAKE 1 TABLET BY MOUTH TWICE A DAY 180 tablet 1   cholecalciferol (VITAMIN D3) 25 MCG (1000 UNIT) tablet Take 2,000 Units by mouth daily.     Digoxin  62.5 MCG TABS Take 62.5 mg by mouth daily.     famotidine  (PEPCID ) 20 MG tablet Take 1 tablet by mouth in the morning and at bedtime.     furosemide  (LASIX ) 20 MG tablet TAKE 1 TABLET (20 MG TOTAL) BY MOUTH AS NEEDED FOR FLUID OR EDEMA. 90 tablet 3   levothyroxine  (SYNTHROID ) 100 MCG tablet Take 100 mcg by mouth daily.     magnesium oxide (MAG-OX) 400 MG tablet Take 400 mg by mouth daily.     metoprolol  tartrate (LOPRESSOR ) 25 MG tablet Take 0.5 tablets (12.5 mg total) by mouth in the morning, at noon, and at bedtime. 180  tablet 3   pantoprazole  (PROTONIX ) 40 MG tablet Take 40 mg by mouth daily.     rosuvastatin (CRESTOR) 5 MG tablet Take 5 mg by mouth daily.     No current facility-administered medications for this visit.     Past Medical History:  Diagnosis Date   Bradycardia 02/07/2022   Cardiac pacemaker 12/11/2018   Formatting of this note might be different from the original.  Medtronic [place], [place], [place]: [id number], DOI: 12/10/2018  Formatting of this note might be different from the original.  Medtronic Micra AV, [id number] leadless pacemaker. S/N: [id number], implanted 12/10/2018.     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   Chest discomfort 12/23/2018   Chronic atrial fibrillation (HCC) 01/26/2016   Last Assessment & Plan:  Has had ablation through [facility] , her BP here was stable , discussed her current meds and the S/E , she is off of the lasix  now and will see her CMP for her as well as CK today for her muscle cramps she has had long before her ablation with possible pain from the statin   Chronic fatigue 07/25/2016   Chronic idiopathic constipation 01/26/2016   Chronic pain of right knee 10/27/2020   Dysrhythmia    on toprol  for fast heart beat   Elevated glucose 05/12/2021   Encounter for care of pacemaker 12/19/2018   Esophageal  stricture 01/26/2016   Essential (primary) hypertension 01/26/2016   Family history of colon cancer in mother 07/17/2017   GERD (gastroesophageal reflux disease) 07/17/2017   H/O hiatal hernia    High risk medication use 01/26/2016   History of adenomatous polyp of colon 07/17/2017   Hypercalcemia 01/26/2016   Hypercoagulable state due to persistent atrial fibrillation (HCC) 01/25/2024   Hypertensive heart disease 06/02/2020   Hypothyroidism    pt. states she took radioactive pill and has no thyroid    Irregular heart rate 07/06/2018   Left shoulder pain 07/06/2018   Localized swelling of right lower extremity 08/26/2021    Long term current use of anticoagulant 07/06/2018   Mixed hyperlipidemia 01/26/2016   Myalgia 05/18/2016   Last Assessment & Plan:  Formatting of this note might be different from the original. 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  04/2016     Thyroid  function test abnormal 08/19/2016   Trigger finger of right thumb 11/01/2023   Trigger middle finger of left hand 11/01/2023   Vitamin D  deficiency 11/26/2017   Wrist arthritis 11/01/2023    ROS:   All systems reviewed and negative except as noted in the HPI.   Past Surgical History:  Procedure Laterality Date   BUNIONECTOMY  yrs. ago   left foot   FOOT SURGERY  2009   took knot out  and put in screws   KNEE ARTHROSCOPY W/ MENISCAL REPAIR  2009   left   TOTAL KNEE ARTHROPLASTY  01/16/2012   Procedure: TOTAL KNEE ARTHROPLASTY;  Surgeon: Garnette JONETTA Raman, MD;  Location: [facility] OR;   Service: Orthopedics;  Laterality: Left;     Family History  Problem Relation Age of Onset   Cancer Mother    Thyroid  disease Mother    Stroke Father    Anesthesia problems Neg Hx    Hypotension Neg Hx    Malignant hyperthermia Neg Hx    Pseudochol deficiency Neg Hx    Sleep apnea Neg Hx      Social History   Socioeconomic History   Marital status: Married    Spouse name: Not on file   Number of children: Not on file   Years of education: Not on file   Highest education level: Not on file  Occupational History   Not on file  Tobacco Use   Smoking status: Never   Smokeless tobacco: Never  Substance and Sexual Activity   Alcohol use: No    Comment: occasional   Drug use: No  Sexual activity: Yes  Other Topics Concern   Not on file  Social History Narrative   Not on file   Social Drivers of Health   Financial Resource Strain: Not on file  Food Insecurity: Not on file  Transportation Needs: Not on file  Physical Activity: Insufficiently Active (11/24/2017)   Exercise Vital Sign    Days of Exercise per Week: 2 days    Minutes of Exercise per Session: 20 min  Stress: Not on file  Social Connections: Not on file  Intimate Partner Violence: Not on file     BP 126/66   Pulse 98   Ht 5' 6 (1.676 m)   Wt 155 lb 11.2 oz (70.6 kg)   SpO2 96%   BMI 25.13 kg/m   Physical Exam:  Well appearing NAD HEENT: Unremarkable Neck:  No JVD, no thyromegally Lymphatics:  No adenopathy Back:  No CVA tenderness Lungs:  Clear with no wheezes HEART:  Regular rate rhythm, no murmurs, no rubs, no clicks Abd:  soft, positive bowel sounds, no organomegally, no rebound, no guarding Ext:  2 plus pulses, no edema, no cyanosis, no clubbing Skin:  No rashes no nodules Neuro:  CN II through XII intact, motor grossly intact  DEVICE  Normal device function.  See PaceArt for details.   Assess/Plan:  Atrial fib - her VR is still not under control. I have reviewed her Micra  historgrams and her VR is over 100/min about 1/5 of the time. I have recommended she increase her low dose digoxin . Coags - she will continue her Eliquis.  Chronic diastolic heart failure - her symptoms are class 2. Hopefully she will improve with better rate control.  HTN - her bp is fairly well controlled. We will follow.  Danelle Marylou Wages,MD

## 2024-06-26 NOTE — Patient Instructions (Addendum)
 Medication Instructions:  Your physician has recommended you make the following change in your medication:  Increase digoxin  125 mcg daily Monday-Friday On Saturday and Sunday take 62.5 mcg daily  Lab Work: Digoxin  level in 3 weeks  You may go to any [facility] Location for your lab work:  [facility] - [address] ([facility]) - [address] [id number]  [place] - [address]   [place]  - [address]   [place] - [address] - [address] [employer]  - [address] - [address] ([facility]   If you have labs (blood work) drawn today and your tests are completely normal, you will receive your results only by: [employer] (if you have MyChart)  If you have any lab test that is abnormal or we need to change your treatment, we will call you or send a MyChart message to review the results.  Testing/Procedures: None ordered.  Follow-Up: At [facility], you and your health needs are our priority.  As part of our continuing mission to provide you with exceptional heart care, we have created designated Provider Care Teams.  These Care Teams include your primary Cardiologist (physician) and Advanced Practice Providers (APPs -  Physician Assistants and Nurse Practitioners) who all work together to provide you with the care you need, when you need it.   Your next appointment:   1 year(s)  The format for your next appointment:   In Person  Provider:   Donnice Primus, MD or one of the following Advanced Practice Providers on your designated Care Team:   Charlies Arthur, [place] Ozell Jodie Passey, [place] Leotis Barrack, NP  Note: Remote monitoring is used to monitor your Pacemaker/ ICD from home. This monitoring reduces the number of office visits required to check your device to one time per year. It allows us  to keep an eye on the  functioning of your device to ensure it is working properly.

## 2024-07-17 ENCOUNTER — Ambulatory Visit

## 2024-07-17 ENCOUNTER — Other Ambulatory Visit: Payer: Self-pay

## 2024-07-17 DIAGNOSIS — I482 Chronic atrial fibrillation, unspecified: Secondary | ICD-10-CM

## 2024-07-17 DIAGNOSIS — I5032 Chronic diastolic (congestive) heart failure: Secondary | ICD-10-CM | POA: Insufficient documentation

## 2024-07-17 DIAGNOSIS — Z79899 Other long term (current) drug therapy: Secondary | ICD-10-CM

## 2024-07-17 DIAGNOSIS — I110 Hypertensive heart disease with heart failure: Secondary | ICD-10-CM | POA: Insufficient documentation

## 2024-07-17 LAB — ECHOCARDIOGRAM COMPLETE
Area-P 1/2: 4.26 cm2
MV M vel: 4.96 m/s
MV Peak grad: 98.4 mmHg
Radius: 0.4 cm
S' Lateral: 2.5 cm

## 2024-07-18 ENCOUNTER — Ambulatory Visit: Payer: Self-pay

## 2024-07-18 LAB — DIGOXIN LEVEL: Digoxin, Serum: 1.7 ng/mL — ABNORMAL HIGH (ref 0.5–0.9)

## 2024-07-19 ENCOUNTER — Ambulatory Visit: Payer: Self-pay

## 2024-07-19 DIAGNOSIS — Z79899 Other long term (current) drug therapy: Secondary | ICD-10-CM

## 2024-07-25 LAB — DIGOXIN LEVEL: Digoxin, Serum: 0.6 ng/mL (ref 0.5–0.9)

## 2024-08-07 ENCOUNTER — Telehealth: Payer: Self-pay

## 2024-08-07 NOTE — Telephone Encounter (Signed)
 Received the following message from Dr. Liborio below:  The results from the echocardiogram study show normal pumping function of the heart. There is mild leakiness associated with mitral valve and moderate leakiness associated with tricuspid valve.   Overall findings are somewhat similar to your prior echocardiogram from January 2022.   Will discuss further at your next follow-up visit. Please do not hesitate to contact my office with any questions.  Thank you  Called the patient and informed her of the results of her test. Patient verbalized understanding and had no further questions at this time.

## 2024-08-07 NOTE — Telephone Encounter (Signed)
 April Padilla

## 2024-08-08 ENCOUNTER — Ambulatory Visit (INDEPENDENT_AMBULATORY_CARE_PROVIDER_SITE_OTHER): Payer: Medicare Other

## 2024-08-08 DIAGNOSIS — I482 Chronic atrial fibrillation, unspecified: Secondary | ICD-10-CM | POA: Diagnosis not present

## 2024-08-09 NOTE — Progress Notes (Signed)
 Remote PPM Transmission

## 2024-08-13 LAB — CUP PACEART REMOTE DEVICE CHECK
Battery Remaining Longevity: 96 mo
Battery Voltage: 2.98 V
Brady Statistic AS VP Percent: 0.19 %
Brady Statistic AS VS Percent: 0 %
Brady Statistic RV Percent Paced: 0.28 %
Date Time Interrogation Session: 20251014151113
Implantable Pulse Generator Implant Date: 20200210
Lead Channel Impedance Value: 650 Ohm
Lead Channel Pacing Threshold Amplitude: 0.625 V
Lead Channel Pacing Threshold Pulse Width: 0.24 ms
Lead Channel Sensing Intrinsic Amplitude: 26.438 mV
Lead Channel Setting Pacing Amplitude: 1.5 V
Lead Channel Setting Pacing Pulse Width: 0.24 ms
Lead Channel Setting Sensing Sensitivity: 2 mV

## 2024-08-14 NOTE — Progress Notes (Signed)
 Remote PPM Transmission

## 2024-08-15 ENCOUNTER — Ambulatory Visit: Payer: Self-pay | Admitting: Internal Medicine

## 2024-09-16 ENCOUNTER — Ambulatory Visit
Admission: RE | Admit: 2024-09-16 | Discharge: 2024-09-16 | Disposition: A | Source: Ambulatory Visit | Attending: Gastroenterology | Admitting: Gastroenterology

## 2024-09-16 ENCOUNTER — Other Ambulatory Visit: Payer: Self-pay | Admitting: Gastroenterology

## 2024-09-16 DIAGNOSIS — R109 Unspecified abdominal pain: Secondary | ICD-10-CM

## 2024-09-23 NOTE — Progress Notes (Unsigned)
 Cardiology Office Note:    Date:  09/23/2024   ID:  April Padilla, April Padilla 05-16-1946, MRN [id number]  PCP:  Silver Lamar LABOR, MD  Cardiologist:  Redell Leiter, MD    Referring MD: Silver Lamar LABOR, MD    ASSESSMENT:    1. Chronic atrial fibrillation (HCC)   2. Chronic anticoagulation   3. Sick sinus syndrome (HCC)   4. Permanent atrial fibrillation (HCC)   5. Hypertensive heart disease with chronic diastolic congestive heart failure (HCC)   6. Mixed hyperlipidemia    PLAN:    In order of problems listed above:  April Padilla is doing much better with her atrial fibrillation little or no symptoms her rate is now well-controlled even at times being ventricularly paced.  Continue her digoxin  recheck a level today my goal is always to keep the levels less than 1 to avoid the excess mortality with atrial fibrillation digoxin  treatment with higher blood levels Continue her current anticoagulant no bleeding complication Stable sick sinus syndrome and pacemaker followed in our device clinic she will transition to Dr. Inocencio as EP Heart failure is well compensated continue her low-dose loop diuretic She has chronic enough venous insufficiency She has chronic vein pain standing Continue her high intensity statin   Next appointment: 6 months   Medication Adjustments/Labs and Tests Ordered: Current medicines are reviewed at length with the patient today.  Concerns regarding medicines are outlined above.  No orders of the defined types were placed in this encounter.  No orders of the defined types were placed in this encounter.    History of Present Illness:    April Padilla is a 78 y.o. female with a hx of chronic atrial fibrillation despite previous ablation 2016 repeat 2017 and failing antiarrhythmic therapy with flecainide on long-term anticoagulation with sick sinus syndrome pacemaker hypertension hide per lipidemia hyperparathyroidism and hypothyroidism last seen  05/30/2023.  Compliance with diet, lifestyle and medications: Yes  April Padilla is doing better in the last months and attributes it to a small increase in her dose of digoxin  Not having symptoms of toxicity visual nausea or thought process but we will recheck a level today Heart rate at home tends to run in the range of 65 to 70 bpm blood pressure 105-120 systolic Not having exercise intolerance edema shortness of breath orthopnea chest pain or syncope No bleeding complication from her anticoagulant Past Medical History:  Diagnosis Date   Bradycardia 02/07/2022   Cardiac pacemaker 12/11/2018   Formatting of this note might be different from the original.  [employer] [place], [place], [place]: [id number], DOI: 12/10/2018  Formatting of this note might be different from the original.  Medtronic Micra AV, [id number] leadless pacemaker. S/N: [id number], implanted 12/10/2018.     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   Chest discomfort 12/23/2018   Chronic atrial fibrillation (HCC) 01/26/2016   Last Assessment & Plan:  Has had ablation through [facility] , her BP here was stable , discussed her current meds and the S/E , she is off of the lasix  now and will see her CMP for her as well as CK today for her muscle cramps she has had long before her ablation with possible pain from the statin   Chronic fatigue 07/25/2016   Chronic idiopathic constipation 01/26/2016   Chronic pain of right knee 10/27/2020   Dysrhythmia    on toprol  for fast heart beat   Elevated glucose 05/12/2021   Encounter for care of pacemaker 12/19/2018  Esophageal stricture 01/26/2016   Essential (primary) hypertension 01/26/2016   Family history of colon cancer in mother 07/17/2017   GERD (gastroesophageal reflux disease) 07/17/2017   H/O hiatal hernia    High risk medication use 01/26/2016   History of adenomatous polyp of colon 07/17/2017   Hypercalcemia 01/26/2016   Hypercoagulable state due to  persistent atrial fibrillation (HCC) 01/25/2024   Hypertensive heart disease 06/02/2020   Hypothyroidism    pt. states she took radioactive pill and has no thyroid    Irregular heart rate 07/06/2018   Left shoulder pain 07/06/2018   Localized swelling of right lower extremity 08/26/2021   Long term current use of anticoagulant 07/06/2018   Mixed hyperlipidemia 01/26/2016   Myalgia 05/18/2016   Last Assessment & Plan:  Formatting of this note might be different from the original. 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   Presence of left artificial knee joint 01/22/2015   Primary generalized (osteo)arthritis 01/26/2016   Primary hyperparathyroidism 11/26/2017   Primary osteoarthritis involving multiple joints 01/26/2016   PSVT (paroxysmal supraventricular tachycardia) 01/26/2016   Recurrent urticaria 01/26/2016   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  04/2016     Thyroid  function test abnormal 08/19/2016   Trigger finger of right thumb 11/01/2023   Trigger middle finger of left hand 11/01/2023   Vitamin D  deficiency 11/26/2017   Wrist arthritis 11/01/2023    Current Medications: No outpatient medications have been marked as taking for the 09/24/24 encounter (Appointment) with Monetta Redell PARAS, MD.      EKGs/Labs/Other Studies Reviewed:    The following studies were reviewed today:  Cardiac Studies & Procedures   ______________________________________________________________________________________________     ECHOCARDIOGRAM  ECHOCARDIOGRAM COMPLETE 07/17/2024  Narrative ECHOCARDIOGRAM REPORT    Patient Name:   April Padilla Date of Exam: 07/17/2024 Medical Rec #:  [id number]        Height:       66.0 in Accession #:    [id number]       Weight:       155.7 lb Date of Birth:  1946-08-19        BSA:          1.798 m Patient Age:    78 years         BP:           126/66 mmHg Patient Gender: F                HR:           92 bpm. Exam Location:  [place]  Procedure: 2D Echo, Cardiac Doppler, Color Doppler and Strain Analysis (Both Spectral and Color Flow Doppler were utilized during procedure).  Indications:    Atrial Fibrillation I48.91  History:        Patient has  prior history of Echocardiogram examinations, most recent 11/11/2020. CHF, Pacemaker, Arrythmias:Atrial Fibrillation; Risk Factors:Hypertension and Dyslipidemia. Sick sinus syndrome (HCC).  Sonographer:    Charlie Jointer RDCS Referring Phys: [id number] ALEAN SAUNDERS MADIREDDY  IMPRESSIONS   1. Left ventricular ejection fraction, by estimation, is 60 to 65%. The left ventricle has normal function. The left ventricle has no regional wall motion abnormalities. There is mild left ventricular hypertrophy. Left ventricular diastolic parameters are indeterminate. The average left ventricular global longitudinal strain is -15.4 %. The global longitudinal strain is abnormal. 2. Right ventricular systolic function is normal. The right ventricular size is mildly enlarged. There is normal pulmonary artery systolic pressure. 3. Left atrial size was moderately dilated. 4. Right atrial size was severely dilated. 5. The mitral valve is normal in structure. Mild mitral valve regurgitation. No evidence of mitral  stenosis. 6. Tricuspid valve regurgitation is moderate. 7. The aortic valve is normal in structure. Aortic valve regurgitation is not visualized. No aortic stenosis is present. 8. The inferior vena cava is normal in size with greater than 50% respiratory variability, suggesting right atrial pressure of 3 mmHg.  FINDINGS Left Ventricle: Left ventricular ejection fraction, by estimation, is 60 to 65%. The left ventricle has normal function. The left ventricle has no regional wall motion abnormalities. The average left ventricular global longitudinal strain is -15.4 %. Strain was performed and the global longitudinal strain is abnormal. The left ventricular internal cavity size was normal in size. There is mild left ventricular hypertrophy. Left ventricular diastolic parameters are indeterminate.  Right Ventricle: The right ventricular size is mildly enlarged. No increase in right ventricular wall thickness. Right ventricular systolic function is normal. There is normal pulmonary artery systolic pressure. The tricuspid regurgitant velocity is 2.58 m/s, and with an assumed right atrial pressure of 8 mmHg, the estimated right ventricular systolic pressure is 34.6 mmHg.  Left Atrium: Left atrial size was moderately dilated.  Right Atrium: Right atrial size was severely dilated.  Pericardium: There is no evidence of pericardial effusion.  Mitral Valve: The mitral valve is normal in structure. Mild mitral valve regurgitation. No evidence of mitral valve stenosis.  Tricuspid Valve: The tricuspid valve is normal in structure. Tricuspid valve regurgitation is moderate . No evidence of tricuspid stenosis.  Aortic Valve: The aortic valve is normal in structure. Aortic valve regurgitation is not visualized. No aortic stenosis is present.  Pulmonic Valve: The pulmonic valve was normal in structure. Pulmonic valve regurgitation is mild. No evidence of pulmonic stenosis.  Aorta: The aortic root is normal in  size and structure.  Venous: The inferior vena cava is normal in size with greater than 50% respiratory variability, suggesting right atrial pressure of 3 mmHg.  IAS/Shunts: No atrial level shunt detected by color flow Doppler.  Additional Comments: A device lead is visualized in the right atrium and right ventricle.   LEFT VENTRICLE PLAX 2D LVIDd:         3.50 cm   Diastology LVIDs:         2.50 cm   LV e' medial:    10.90 cm/s LV PW:         1.20 cm   LV E/e' medial:  10.3 LV IVS:        1.30 cm   LV e' lateral:   12.70 cm/s LVOT diam:     1.90 cm   LV E/e' lateral: 8.8 LV SV:         45 LV SV Index:   25  2D Longitudinal Strain LVOT Area:     2.84 cm  2D Strain GLS Avg:     -15.4 %   RIGHT VENTRICLE            IVC RV Basal diam:  4.50 cm    IVC diam: 1.90 cm RV Mid diam:    3.40 cm RV S prime:     7.78 cm/s TAPSE (M-mode): 1.4 cm  LEFT ATRIUM             Index        RIGHT ATRIUM           Index LA diam:        4.60 cm 2.56 cm/m   RA Area:     38.70 cm LA Vol (A2C):   81.6 ml 45.39 ml/m  RA Volume:   157.00 ml 87.32 ml/m LA Vol (A4C):   72.8 ml 40.49 ml/m LA Biplane Vol: 79.9 ml 44.44 ml/m AORTIC VALVE             PULMONIC VALVE LVOT Vmax:   78.27 cm/s  PR End Diast Vel: 4.24 msec LVOT Vmean:  51.700 cm/s LVOT VTI:    0.159 m  AORTA Ao Root diam: 3.00 cm Ao Asc diam:  2.90 cm Ao Desc diam: 1.70 cm  MITRAL VALVE                  TRICUSPID VALVE MV Area (PHT): 4.26 cm       TR Peak grad:   26.6 mmHg MV Decel Time: 178 msec       TR Vmax:        258.00 cm/s MR Peak grad:    98.4 mmHg MR Mean grad:    72.0 mmHg    SHUNTS MR Vmax:         496.00 cm/s  Systemic VTI:  0.16 m MR Vmean:        408.0 cm/s   Systemic Diam: 1.90 cm MR PISA:         1.01 cm MR PISA Eff ROA: 8 mm MR PISA Radius:  0.40 cm MV E velocity: 112.00 cm/s  Lamar Fitch MD Electronically signed by Lamar Fitch MD Signature Date/Time: 07/17/2024/1:41:38 PM    Final     MONITORS  LONG TERM MONITOR (3-14 DAYS) 11/29/2022  Narrative Patch Wear Time:  7 days and 5 hours (2024-01-16T15:00:05-499 to 2024-01-23T20:03:14-0500)  Atrial Fibrillation occurred continuously (100% burden), ranging from 47-107 bpm (avg of 70 bpm).  Heart rate response was well-controlled atrial fibrillation  There is symptomatic events noted to with brief runs of accelerated idioventricular rhythm  Idioventricular Rhythm was present. Idioventricular Rhythm was detected within +/- 45 seconds of symptomatic patient event(s).  Isolated VEs were rare (<1.0%, 1681), VE Couplets were rare (<1.0%, 232), and VE Triplets were rare (<1.0%, 90). Ventricular Bigeminy was present.       ______________________________________________________________________________________________          Recent Labs: 10/11/2023: BUN 15; Creat 0.90; Potassium 4.5; Sodium 141; TSH 1.95 01/22/2024: Hemoglobin 14.1; Platelets 286  Recent Lipid Panel    Component Value Date/Time   CHOL 179 12/28/2021 1120   TRIG 143 12/28/2021 1120   HDL 87 12/28/2021 1120   CHOLHDL 2.1 12/28/2021 1120   LDLCALC 68 12/28/2021 1120    Physical Exam:    VS:  There were no vitals taken for this visit.    Wt Readings from Last 3 Encounters:  06/26/24 155 lb 11.2 oz (70.6  kg)  05/24/24 154 lb 3.2 oz (69.9 kg)  01/25/24 153 lb 6.4 oz (69.6 kg)     GEN:  Well nourished, well developed in no acute distress HEENT: Normal NECK: No JVD; No carotid bruits LYMPHATICS: No lymphadenopathy CARDIAC: Irregular rate and rhythm  RESPIRATORY:  Clear to auscultation without rales, wheezing or rhonchi  ABDOMEN: Soft, non-tender, non-distended MUSCULOSKELETAL:  No edema; No deformity  SKIN: Warm and dry NEUROLOGIC:  Alert and oriented x 3 PSYCHIATRIC:  Normal affect    Signed, Redell Leiter, MD  09/23/2024 9:24 AM    [facility]

## 2024-09-24 ENCOUNTER — Encounter: Payer: Self-pay | Admitting: Cardiology

## 2024-09-24 ENCOUNTER — Ambulatory Visit: Attending: Cardiology | Admitting: Cardiology

## 2024-09-24 VITALS — BP 122/64 | HR 72 | Ht 66.0 in | Wt 150.4 lb

## 2024-09-24 DIAGNOSIS — I110 Hypertensive heart disease with heart failure: Secondary | ICD-10-CM | POA: Insufficient documentation

## 2024-09-24 DIAGNOSIS — E782 Mixed hyperlipidemia: Secondary | ICD-10-CM | POA: Insufficient documentation

## 2024-09-24 DIAGNOSIS — I5032 Chronic diastolic (congestive) heart failure: Secondary | ICD-10-CM | POA: Insufficient documentation

## 2024-09-24 DIAGNOSIS — I482 Chronic atrial fibrillation, unspecified: Secondary | ICD-10-CM | POA: Diagnosis not present

## 2024-09-24 DIAGNOSIS — I495 Sick sinus syndrome: Secondary | ICD-10-CM | POA: Insufficient documentation

## 2024-09-24 DIAGNOSIS — Z7901 Long term (current) use of anticoagulants: Secondary | ICD-10-CM | POA: Insufficient documentation

## 2024-09-24 DIAGNOSIS — I4821 Permanent atrial fibrillation: Secondary | ICD-10-CM | POA: Insufficient documentation

## 2024-09-24 NOTE — Patient Instructions (Addendum)
 Medication Instructions:  Your physician recommends that you continue on your current medications as directed. Please refer to the Current Medication list given to you today.  *If you need a refill on your cardiac medications before your next appointment, please call your pharmacy*  Lab Work: Your physician recommends that you return for lab work in:   Labs today: BMP, Pro BNP, Digoxin  level  If you have labs (blood work) drawn today and your tests are completely normal, you will receive your results only by: MyChart Message (if you have MyChart) OR A paper copy in the mail If you have any lab test that is abnormal or we need to change your treatment, we will call you to review the results.  Testing/Procedures: None   Follow-Up: At [facility], you and your health needs are our priority.  As part of our continuing mission to provide you with exceptional heart care, our providers are all part of one team.  This team includes your primary Cardiologist (physician) and Advanced Practice Providers or APPs (Physician Assistants and Nurse Practitioners) who all work together to provide you with the care you need, when you need it.  Your next appointment:   9 month(s)  Provider:   Redell Leiter, MD    We recommend signing up for the patient portal called MyChart.  Sign up information is provided on this After Visit Summary.  MyChart is used to connect with patients for Virtual Visits (Telemedicine).  Patients are able to view lab/test results, encounter notes, upcoming appointments, etc.  Non-urgent messages can be sent to your provider as well.   To learn more about what you can do with MyChart, go to [email or web address].   Other Instructions None

## 2024-09-25 ENCOUNTER — Ambulatory Visit: Payer: Self-pay | Admitting: Cardiology

## 2024-09-25 LAB — BASIC METABOLIC PANEL WITH GFR
BUN/Creatinine Ratio: 10 — ABNORMAL LOW (ref 12–28)
BUN: 10 mg/dL (ref 8–27)
CO2: 24 mmol/L (ref 20–29)
Calcium: 10.6 mg/dL — ABNORMAL HIGH (ref 8.7–10.3)
Chloride: 101 mmol/L (ref 96–106)
Creatinine, Ser: 0.99 mg/dL (ref 0.57–1.00)
Glucose: 82 mg/dL (ref 70–99)
Potassium: 4.4 mmol/L (ref 3.5–5.2)
Sodium: 139 mmol/L (ref 134–144)
eGFR: 58 mL/min/1.73 — ABNORMAL LOW (ref >59)

## 2024-09-25 LAB — DIGOXIN LEVEL: Digoxin, Serum: 0.7 ng/mL (ref 0.5–0.9)

## 2024-09-25 LAB — PRO B NATRIURETIC PEPTIDE: NT-Pro BNP: 591 pg/mL (ref 0–738)

## 2024-10-02 ENCOUNTER — Other Ambulatory Visit (HOSPITAL_BASED_OUTPATIENT_CLINIC_OR_DEPARTMENT_OTHER): Payer: Self-pay

## 2024-10-02 DIAGNOSIS — R634 Abnormal weight loss: Secondary | ICD-10-CM

## 2024-10-05 LAB — PTH, INTACT AND CALCIUM
Calcium: 10.1 mg/dL (ref 8.7–10.3)
PTH: 87 pg/mL — ABNORMAL HIGH (ref 15–65)

## 2024-10-05 LAB — CALCIUM, IONIZED: Calcium, Ion: 5.2 mg/dL (ref 4.5–5.6)

## 2024-10-06 ENCOUNTER — Encounter: Payer: Self-pay | Admitting: Cardiology

## 2024-10-14 ENCOUNTER — Ambulatory Visit: Admitting: Physician Assistant

## 2024-10-22 ENCOUNTER — Ambulatory Visit (HOSPITAL_BASED_OUTPATIENT_CLINIC_OR_DEPARTMENT_OTHER)
Admission: RE | Admit: 2024-10-22 | Discharge: 2024-10-22 | Disposition: A | Discharge status: Home / Self Care | Source: Ambulatory Visit

## 2024-10-22 ENCOUNTER — Encounter (HOSPITAL_BASED_OUTPATIENT_CLINIC_OR_DEPARTMENT_OTHER): Payer: Self-pay | Admitting: *Deleted

## 2024-10-22 ENCOUNTER — Other Ambulatory Visit (HOSPITAL_BASED_OUTPATIENT_CLINIC_OR_DEPARTMENT_OTHER): Payer: Self-pay

## 2024-10-22 DIAGNOSIS — R1013 Epigastric pain: Secondary | ICD-10-CM | POA: Diagnosis not present

## 2024-10-22 DIAGNOSIS — R634 Abnormal weight loss: Secondary | ICD-10-CM

## 2024-11-07 ENCOUNTER — Ambulatory Visit: Attending: Cardiology

## 2024-11-07 DIAGNOSIS — I482 Chronic atrial fibrillation, unspecified: Secondary | ICD-10-CM | POA: Diagnosis not present

## 2024-11-08 LAB — CUP PACEART REMOTE DEVICE CHECK
Battery Remaining Longevity: 94 mo
Battery Voltage: 2.97 V
Brady Statistic AS VP Percent: 0.12 %
Brady Statistic AS VS Percent: 0 %
Brady Statistic RV Percent Paced: 0.26 %
Date Time Interrogation Session: 20260109151313
Implantable Pulse Generator Implant Date: 20200210
Lead Channel Impedance Value: 660 Ohm
Lead Channel Pacing Threshold Amplitude: 0.5 V
Lead Channel Pacing Threshold Pulse Width: 0.24 ms
Lead Channel Sensing Intrinsic Amplitude: 28.575 mV
Lead Channel Setting Pacing Amplitude: 1.5 V
Lead Channel Setting Pacing Pulse Width: 0.24 ms
Lead Channel Setting Sensing Sensitivity: 2 mV

## 2024-11-11 ENCOUNTER — Ambulatory Visit: Payer: Self-pay | Admitting: Cardiology

## 2024-11-12 NOTE — Progress Notes (Signed)
 Remote PPM Transmission

## 2024-11-13 ENCOUNTER — Telehealth: Payer: Self-pay | Admitting: Cardiology

## 2024-11-13 NOTE — Telephone Encounter (Signed)
" °  1. Has your device fired?   No  2. Is you device beeping?   No  3. Are you experiencing draining or swelling at device site?   No  4. Are you calling to see if we received your device transmission?   Yes  5. Have you passed out?   No  Please route to Device Clinic Pool   Patient is follow-up regarding her transmission on 1/8 and is concerned she received a MyChart message about her battery. "

## 2024-11-13 NOTE — Telephone Encounter (Signed)
 Returned call to Pt.  Advised that she has roughly 8 years on her battery.  Advised that her transmission report from [employer] to Epic is not seamless between systems that causes red exclamation points, and NULL in red.  Advised device clinic would always call if any alerts or concerns related to her device.  Pt thanked nurse for call back.

## 2025-02-06 ENCOUNTER — Ambulatory Visit

## 2025-05-08 ENCOUNTER — Ambulatory Visit

## 2025-08-07 ENCOUNTER — Ambulatory Visit

## 2025-11-06 ENCOUNTER — Ambulatory Visit
